# Patient Record
Sex: Male | Born: 1960 | Race: White | Hispanic: No | Marital: Married | Smoking: Never smoker
Health system: Southern US, Community
[De-identification: ages and names within clinical notes are randomized; demographics above are authoritative.]

## PROBLEM LIST (undated history)

## (undated) DIAGNOSIS — K219 Gastro-esophageal reflux disease without esophagitis: Secondary | ICD-10-CM

## (undated) DIAGNOSIS — Z0000 Encounter for general adult medical examination without abnormal findings: Principal | ICD-10-CM

## (undated) DIAGNOSIS — L918 Other hypertrophic disorders of the skin: Secondary | ICD-10-CM

## (undated) DIAGNOSIS — M1990 Unspecified osteoarthritis, unspecified site: Secondary | ICD-10-CM

## (undated) DIAGNOSIS — R7989 Other specified abnormal findings of blood chemistry: Secondary | ICD-10-CM

## (undated) DIAGNOSIS — R000 Tachycardia, unspecified: Secondary | ICD-10-CM

## (undated) DIAGNOSIS — E669 Obesity, unspecified: Secondary | ICD-10-CM

## (undated) DIAGNOSIS — G4730 Sleep apnea, unspecified: Secondary | ICD-10-CM

## (undated) DIAGNOSIS — Z8619 Personal history of other infectious and parasitic diseases: Secondary | ICD-10-CM

## (undated) DIAGNOSIS — I10 Essential (primary) hypertension: Secondary | ICD-10-CM

## (undated) DIAGNOSIS — B019 Varicella without complication: Secondary | ICD-10-CM

## (undated) DIAGNOSIS — D649 Anemia, unspecified: Secondary | ICD-10-CM

## (undated) DIAGNOSIS — E1169 Type 2 diabetes mellitus with other specified complication: Secondary | ICD-10-CM

## (undated) DIAGNOSIS — I739 Peripheral vascular disease, unspecified: Secondary | ICD-10-CM

## (undated) HISTORY — DX: Type 2 diabetes mellitus with other specified complication: E11.69

## (undated) HISTORY — DX: Personal history of other infectious and parasitic diseases: Z86.19

## (undated) HISTORY — DX: Varicella without complication: B01.9

## (undated) HISTORY — DX: Essential (primary) hypertension: I10

## (undated) HISTORY — DX: Peripheral vascular disease, unspecified: I73.9

## (undated) HISTORY — DX: Anemia, unspecified: D64.9

## (undated) HISTORY — DX: Other specified abnormal findings of blood chemistry: R79.89

## (undated) HISTORY — DX: Encounter for general adult medical examination without abnormal findings: Z00.00

## (undated) HISTORY — DX: Other hypertrophic disorders of the skin: L91.8

## (undated) HISTORY — DX: Obesity, unspecified: E66.9

## (undated) HISTORY — DX: Tachycardia, unspecified: R00.0

## (undated) HISTORY — PX: OTHER SURGICAL HISTORY: SHX169

---

## 2004-11-28 ENCOUNTER — Ambulatory Visit (HOSPITAL_BASED_OUTPATIENT_CLINIC_OR_DEPARTMENT_OTHER): Admission: RE | Admit: 2004-11-28 | Discharge: 2004-11-28 | Payer: Self-pay | Admitting: Family Medicine

## 2004-12-05 ENCOUNTER — Ambulatory Visit: Payer: Self-pay | Admitting: Internal Medicine

## 2010-03-21 LAB — HM COLONOSCOPY: HM Colonoscopy: NORMAL

## 2011-10-16 ENCOUNTER — Ambulatory Visit (INDEPENDENT_AMBULATORY_CARE_PROVIDER_SITE_OTHER): Payer: BC Managed Care – PPO | Admitting: Emergency Medicine

## 2011-10-16 VITALS — BP 138/88 | HR 76 | Temp 98.1°F | Resp 16 | Ht 68.0 in | Wt >= 6400 oz

## 2011-10-16 DIAGNOSIS — M79609 Pain in unspecified limb: Secondary | ICD-10-CM

## 2011-10-16 DIAGNOSIS — M79662 Pain in left lower leg: Secondary | ICD-10-CM

## 2011-10-16 DIAGNOSIS — R609 Edema, unspecified: Secondary | ICD-10-CM

## 2011-10-16 DIAGNOSIS — L309 Dermatitis, unspecified: Secondary | ICD-10-CM

## 2011-10-16 DIAGNOSIS — L259 Unspecified contact dermatitis, unspecified cause: Secondary | ICD-10-CM

## 2011-10-16 LAB — POCT CBC
Granulocyte percent: 67 %G (ref 37–80)
MCV: 94.2 fL (ref 80–97)
MID (cbc): 0.6 (ref 0–0.9)
POC Granulocyte: 4.7 (ref 2–6.9)
POC LYMPH PERCENT: 24.1 %L (ref 10–50)
POC MID %: 8.9 %M (ref 0–12)
Platelet Count, POC: 367 10*3/uL (ref 142–424)
RDW, POC: 15.1 %

## 2011-10-16 LAB — BASIC METABOLIC PANEL
BUN: 15 mg/dL (ref 6–23)
Potassium: 4.4 mEq/L (ref 3.5–5.3)
Sodium: 140 mEq/L (ref 135–145)

## 2011-10-16 MED ORDER — TRIAMCINOLONE 0.1 % CREAM:EUCERIN CREAM 1:1: TOPICAL_CREAM | CUTANEOUS | Status: DC

## 2011-10-16 NOTE — Progress Notes (Signed)
  Subjective:    Patient ID: Jeffrey Molina, male    DOB: [birth year], 51 y.o.   MRN: [id number]  HPI patient has a long history of venous stasis disease. Has a long history of calf swelling redness and scaling of both of his legs. He's never had a PE to hemorrhoids and is in good health. 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 There are venous stasis changes involving both legs. There is a mild amount of redness over the left anterior shin.  Results for orders placed in visit on 10/16/11  POCT CBC      Component Value Range   WBC 7.0  4.6 - 10.2 K/uL   Lymph, poc 1.7  0.6 - 3.4   POC LYMPH PERCENT 24.1  10 - 50 %L   MID (cbc) 0.6  0 - 0.9   POC MID % 8.9  0 - 12 %M   POC Granulocyte 4.7  2 - 6.9   Granulocyte percent 67.0  37 - 80 %G   RBC 5.25  4.69 - 6.13 M/uL   Hemoglobin 14.9  14.1 - 18.1 g/dL   HCT, POC 40.9  81.1 - 53.7 %   MCV 94.2  80 - 97 fL   MCH, POC 28.4  27 - 31.2 pg   MCHC 30.1 (*) 31.8 - 35.4 g/dL   RDW, POC 91.4     Platelet Count, POC 367  142 - 424 K/uL   MPV 7.3  0 - 99.8 fL        Assessment & Plan:  Patient with severe venous stasis disease with questionable cellulitis but I doubt this . 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 We'll treat with triamcinolone Eucerin mixed. Have given him the number  for getting support stockings. We'll set him up for venous Dopplers to see how significant his venous insufficiency is .

## 2011-10-16 NOTE — Patient Instructions (Addendum)
Venous Stasis and Chronic Venous Insufficiency As people age, the veins located in their legs may weaken and stretch. When veins weaken and lose the ability to pump blood effectively, the condition is called chronic venous insufficiency (CVI) or venous stasis. Almost all veins return blood back to the heart. This happens by:  The force of the heart pumping fresh blood pushes blood back to the heart.   Blood flowing to the heart from the force of gravity.  In the deep veins of the legs, blood has to fight gravity and flow upstream back to the heart. Here, the leg muscles contract to pump blood back toward the heart. Vein walls are elastic, and many veins have small valves that only allow blood to flow in one direction. When leg muscles contract, they push inward against the elastic vein walls. This squeezes blood upward, opens the valves, and moves blood toward the heart. When leg muscles relax, the vein wall also relaxes and the valves inside the vein close to prevent blood from flowing backward. This method of pumping blood out of the legs is called the venous pump. CAUSES  The venous pump works best while walking and leg muscles are contracting. But when a person sits or stands, blood pressure in leg veins can build. Deep veins are usually able to withstand short periods of inactivity, but long periods of inactivity (and increased pressure) can stretch, weaken, and damage vein walls. High blood pressure can also stretch and damage vein walls. The veins may no longer be able to pump blood back to the heart. Venous hypertension (high blood pressure inside veins) that lasts over time is a primary cause of CVI. CVI can also be caused by:   Deep vein thrombosis, a condition where a thrombus (blood clot) blocks blood flow in a vein.   Phlebitis, an inflammation of a superficial vein that causes a blood clot to form.  Other risk factors for CVI may include:   Heredity.   Obesity.   Pregnancy.    Sedentary lifestyle.   Smoking.   Jobs requiring long periods of standing or sitting in one place.   Age and gender:   Women in their 40's and 50's and men in their 70's are more prone to developing CVI.  SYMPTOMS  Symptoms of CVI may include:   Varicose veins.   Ulceration or skin breakdown.   Lipodermatosclerosis, a condition that affects the skin just above the ankle, usually on the inside surface. Over time the skin becomes brown, smooth, tight and often painful. Those with this condition have a high risk of developing skin ulcers.   Reddened or discolored skin on the leg.   Swelling.  DIAGNOSIS  Your caregiver can diagnose CVI after performing a careful medical history and physical examination. To confirm the diagnosis, the following tests may also be ordered:   Duplex ultrasound.   Plethysmography (tests blood flow).   Venograms (x-ray using a special dye).  TREATMENT The goals of treatment for CVI are to restore a person to an active life and to minimize pain or disability. Typically, CVI does not pose a serious threat to life or limb, and with proper treatment most people with this condition can continue to lead active lives. In most cases, mild CVI can be treated on an outpatient basis with simple procedures. Treatment methods include:   Elastic compression socks.   Sclerotherapy, a procedure involving an injection of a material that "dissolves" the damaged veins. Other veins in the network   of blood vessels take over the function of the damaged veins.   Vein stripping (an older procedure less commonly used).   Laser Ablation surgery.   Valve repair.  HOME CARE INSTRUCTIONS   Elastic compression socks must be worn every day. They can help with symptoms and lower the chances of the problem getting worse, but they do not cure the problem.   Only take over-the-counter or prescription medicines for pain, discomfort, or fever as directed by your caregiver.    Your caregiver will review your other medications with you.  SEEK MEDICAL CARE IF:   You are confused about how to take your medications.   There is redness, swelling, or increasing pain in the affected area.   There is a red streak or line that extends up or down from the affected area.   There is a breakdown or loss of skin in the affected area, even if the breakdown is small.   You develop an unexplained oral temperature above 102 F (38.9 C).   There is an injury to the affected area.  SEEK IMMEDIATE MEDICAL CARE IF:   There is an injury and open wound to the affected area.   Pain is not adequately relieved with pain medication prescribed or becomes severe.   An oral temperature above 102 F (38.9 C) develops.   The foot/ankle below the affected area becomes suddenly numb or the area feels weak and hard to move.  MAKE SURE YOU:   Understand these instructions.   Will watch your condition.   Will get help right away if you are not doing well or get worse.  Document Released: 07/11/2006 Document Revised: 02/24/2011 Document Reviewed: 09/18/2006 [facility] Patient Information 2012 [place], [place].Varicose Veins Varicose veins are veins that have become enlarged and twisted. CAUSES This condition is the result of valves in the veins not working properly. Valves in the veins help return blood from the leg to the heart. If these valves are damaged, blood flows backwards and backs up into the veins in the leg near the skin. This causes the veins to become larger. People who are on their feet a lot, who are pregnant, or who are overweight are more likely to develop varicose veins. SYMPTOMS   Bulging, twisted-appearing, bluish veins, most commonly found on the legs.   Leg pain or a feeling of heaviness. These symptoms may be worse at the end of the day.   Leg swelling.   Skin color changes.  DIAGNOSIS  Varicose veins can usually be diagnosed with an exam of your legs by  your caregiver. He or she may recommend an ultrasound of your leg veins. TREATMENT  Most varicose veins can be treated at home.However, other treatments are available for people who have persistent symptoms or who want to treat the cosmetic appearance of the varicose veins. These include:  Laser treatment of very small varicose veins.   Medicine that is shot (injected) into the vein. This medicine hardens the walls of the vein and closes off the vein. This treatment is called sclerotherapy. Afterwards, you may need to wear clothing or bandages that apply pressure.   Surgery.  HOME CARE INSTRUCTIONS   Do not stand or sit in one position for long periods of time. Do not sit with your legs crossed. Rest with your legs raised during the day.   Wear elastic stockings or support hose. Do not wear other tight, encircling garments around the legs, pelvis, or waist.   Walk as much  as possible to increase blood flow.   Raise the foot of your bed at night with 2-inch blocks.   If you get a cut in the skin over the vein and the vein bleeds, lie down with your leg raised and press on it with a clean cloth until the bleeding stops. Then place a bandage (dressing) on the cut. See your caregiver if it continues to bleed or needs stitches.  SEEK MEDICAL CARE IF:   The skin around your ankle starts to break down.   You have pain, redness, tenderness, or hard swelling developing in your leg over a vein.   You are uncomfortable due to leg pain.  Document Released: 12/15/2004 Document Revised: 02/24/2011 Document Reviewed: 05/03/2010 [facility] Patient Information 2012 [place], [place].

## 2011-10-19 ENCOUNTER — Telehealth: Payer: Self-pay | Admitting: *Deleted

## 2011-10-19 NOTE — Telephone Encounter (Signed)
I had called to make and appt for another pt at the vascular lab and the scheduler told that Mr. Jeffrey Molina had called and changed his appt to 08/13 and stated that he was ok to wait.  Just FYI

## 2011-10-20 ENCOUNTER — Ambulatory Visit (HOSPITAL_COMMUNITY): Payer: BC Managed Care – PPO

## 2011-11-02 ENCOUNTER — Ambulatory Visit (HOSPITAL_COMMUNITY)
Admission: RE | Admit: 2011-11-02 | Discharge: 2011-11-02 | Disposition: A | Discharge status: Home / Self Care | Payer: BC Managed Care – PPO | Source: Ambulatory Visit | Attending: Emergency Medicine | Admitting: Emergency Medicine

## 2011-11-02 ENCOUNTER — Telehealth: Payer: Self-pay | Admitting: Radiology

## 2011-11-02 DIAGNOSIS — M79662 Pain in left lower leg: Secondary | ICD-10-CM

## 2011-11-02 DIAGNOSIS — M7989 Other specified soft tissue disorders: Secondary | ICD-10-CM

## 2011-11-02 DIAGNOSIS — R609 Edema, unspecified: Secondary | ICD-10-CM | POA: Insufficient documentation

## 2011-11-02 NOTE — Progress Notes (Signed)
VASCULAR LAB PRELIMINARY  PRELIMINARY  PRELIMINARY  PRELIMINARY  Bilateral lower extremity venous duplex completed.    Preliminary report:  Bilateral:  No evidence of DVT, superficial thrombosis, or Baker's Cyst. Mild edema right lower leg. Mild to moderate edema left lower extremity. Anechoic area noted around the anterior to lateral knee consistent with fluid.   Adriena Manfre, RVS 11/02/2011, 10:22 AM

## 2011-11-02 NOTE — Telephone Encounter (Signed)
Left message with a male to have Pt call us back when he returns home later today. Eileen Stanford

## 2011-11-02 NOTE — Telephone Encounter (Signed)
Please call patient know it is important that he wear his support stockings . If you would like referral to one of the vein specialist I would be happy to do that

## 2011-11-02 NOTE — Telephone Encounter (Signed)
Patient has had doppler study, it is negative for blood clots, he does have localized fluid collection, as noted in report. I have advised [place] in the Vascular Lab to let patient go home, to tell him the study is negative for blood clots and we will call him with further instructions.

## 2011-11-03 NOTE — Telephone Encounter (Signed)
Called home # and it sounded like an older child answered. I asked if pt had a cell # I could call him on and was given [phone]. LMOM on pt's VM to CB.

## 2011-11-07 NOTE — Telephone Encounter (Signed)
LMOM on pt's cell phone to CB.

## 2011-11-08 NOTE — Telephone Encounter (Signed)
LMOM on cell that I will send him a note w/message from Dr Cleta Alberts since we have been unable to reach him by phone, but that he can CB if he has any ?s/concerns or if he would like the referral to vein specialist. Mailed unable to reach letter w/instr's from Dr Cleta Alberts.

## 2011-11-14 NOTE — Telephone Encounter (Signed)
Pt had CB and LM on nurse VM that he was returning our call. Tried to call him back and had to LM again. On message pt left it sounded like he said there was some problem w/him losing something to do w/his stockings. Clarify if pt needs something.

## 2011-11-14 NOTE — Telephone Encounter (Signed)
I have called and left message for him to advise.

## 2011-11-14 NOTE — Telephone Encounter (Signed)
He needs to know the name and location of the hosiery outlet. I am having this information sent to me and I will call on his cell

## 2012-05-02 ENCOUNTER — Ambulatory Visit (INDEPENDENT_AMBULATORY_CARE_PROVIDER_SITE_OTHER): Payer: BC Managed Care – PPO | Admitting: Physician Assistant

## 2012-05-02 VITALS — BP 150/98 | HR 89 | Temp 98.1°F | Resp 16 | Ht 68.0 in | Wt >= 6400 oz

## 2012-05-02 DIAGNOSIS — H9201 Otalgia, right ear: Secondary | ICD-10-CM

## 2012-05-02 DIAGNOSIS — H9209 Otalgia, unspecified ear: Secondary | ICD-10-CM

## 2012-05-02 MED ORDER — IPRATROPIUM BROMIDE 0.03 % NA SOLN: 2.0000 | Freq: Two times a day (BID) | NASAL | Status: DC

## 2012-05-02 MED ORDER — AMOXICILLIN 500 MG PO TABS: 1000.0000 mg | ORAL_TABLET | Freq: Three times a day (TID) | ORAL | Status: DC

## 2012-05-02 NOTE — Progress Notes (Signed)
  Subjective:    Patient ID: Jeffrey Molina, male    DOB: May 30, 1960, 52 y.o.   MRN: [id number]  HPI   Jeffrey Molina is a pleasant 52 yr old male here with concern for ear infection.  Began having right ear pain around 2pm yesterday.  Almost feels like there is water in the ear.  Woke up from the pain several times last night.  Denies change in hearing.  Also having some discomfort with swallowing.  Denies sore throat, endorses only right sided discomfort with swallowing.  States that he has "sinus problems" but denies any symptoms today.  No cough.  No fever or chills.  States a coworker was recently diagnosed with an ear infection, wonders if this could be related to his symptoms.    Pt has elevated BP today at 150/98.  States his BP varies widely but he is usually "high normal"  Review of Systems  Constitutional: Negative for fever and chills.  HENT: Positive for ear pain (right). Negative for hearing loss, congestion, sore throat, rhinorrhea and ear discharge.   Respiratory: Negative.   Cardiovascular: Negative.   Gastrointestinal: Negative.   Musculoskeletal: Negative.   Skin: Negative.   Neurological: Negative for dizziness, light-headedness and headaches.       Objective:   Physical Exam  Vitals reviewed. Constitutional: He is oriented to person, place, and time. He appears well-developed and well-nourished. No distress.  HENT:  Head: Normocephalic and atraumatic.  Right Ear: Ear canal normal.  Left Ear: Tympanic membrane and ear canal normal.  Nose: Nose normal. Right sinus exhibits no maxillary sinus tenderness and no frontal sinus tenderness. Left sinus exhibits no maxillary sinus tenderness and no frontal sinus tenderness.  Mouth/Throat: Uvula is midline, oropharynx is clear and moist and mucous membranes are normal.  Right TM mildly erythematous and flat but not bulging  Eyes: Conjunctivae are normal. No scleral icterus.  Neck: Neck supple.  Cardiovascular: Normal rate,  regular rhythm and normal heart sounds.   Pulmonary/Chest: Breath sounds normal.  Lymphadenopathy:    He has no cervical adenopathy.  Neurological: He is alert and oriented to person, place, and time.  Skin: Skin is warm and dry.  Psychiatric: He has a normal mood and affect. His behavior is normal.     Filed Vitals:   05/02/12 0824  BP: 150/98  Pulse: 89  Temp: 98.1 F (36.7 C)  Resp: 16        Assessment & Plan:   1. Otalgia of right ear  amoxicillin (AMOXIL) 500 MG tablet      ipratropium (ATROVENT) 0.03 % nasal spray    Jeffrey Molina is a pleasant 52 yr old male with otalgia.  Right TM is mildly erythematous compared with the left but it is not bulging.  Possibly mid ear effusion vs eustachian tube dysfunction vs early OM.  Discussed with pt that I think abx may be overkill at this point.  I think it's reasonable to try Atrovent nasal spray and ibuprofen for pain relief.  Pt concerned about winter weather, would like to start abx now.  I have sent amox to his pharmacy.  Encouraged him to finish the full course of antibiotics if he starts them.  Discussed RTC precautions.  Pt understands and is in agreement.

## 2012-05-02 NOTE — Patient Instructions (Addendum)
Begin taking the antibiotics as directed (2 pills, three times per day) for 10 days.  Take with food to reduce stomach upset.  Be sure to finish the full course.  In addition, begin using Atrovent nasal spray twice daily to help relieve pressure in the ear.   If you feel like you are worsening or not improving, please let us know.

## 2013-04-21 LAB — HM DIABETES EYE EXAM

## 2013-05-26 ENCOUNTER — Ambulatory Visit (INDEPENDENT_AMBULATORY_CARE_PROVIDER_SITE_OTHER): Payer: BC Managed Care – PPO | Admitting: Internal Medicine

## 2013-05-26 DIAGNOSIS — R609 Edema, unspecified: Secondary | ICD-10-CM

## 2013-05-26 DIAGNOSIS — I739 Peripheral vascular disease, unspecified: Secondary | ICD-10-CM

## 2013-05-26 LAB — COMPREHENSIVE METABOLIC PANEL
ALBUMIN: 4.1 g/dL (ref 3.5–5.2)
ALK PHOS: 39 U/L (ref 39–117)
ALT: 30 U/L (ref 0–53)
AST: 22 U/L (ref 0–37)
BUN: 14 mg/dL (ref 6–23)
CALCIUM: 9.2 mg/dL (ref 8.4–10.5)
CHLORIDE: 102 meq/L (ref 96–112)
CO2: 29 mEq/L (ref 19–32)
Creat: 1.06 mg/dL (ref 0.50–1.35)
Glucose, Bld: 117 mg/dL — ABNORMAL HIGH (ref 70–99)
POTASSIUM: 4.5 meq/L (ref 3.5–5.3)
SODIUM: 140 meq/L (ref 135–145)
TOTAL PROTEIN: 6.9 g/dL (ref 6.0–8.3)
Total Bilirubin: 0.4 mg/dL (ref 0.2–1.2)

## 2013-05-26 LAB — LIPID PANEL
Cholesterol: 187 mg/dL (ref 0–200)
HDL: 30 mg/dL — ABNORMAL LOW (ref >39)
LDL CALC: 132 mg/dL — AB (ref 0–99)
Total CHOL/HDL Ratio: 6.2 Ratio
Triglycerides: 126 mg/dL (ref <150)
VLDL: 25 mg/dL (ref 0–40)

## 2013-05-26 LAB — POCT CBC
GRANULOCYTE PERCENT: 63.1 % (ref 37–80)
HCT, POC: 43.5 % (ref 43.5–53.7)
Hemoglobin: 13.8 g/dL — AB (ref 14.1–18.1)
Lymph, poc: 1.7 (ref 0.6–3.4)
MCH, POC: 29.7 pg (ref 27–31.2)
MCHC: 31.7 g/dL — AB (ref 31.8–35.4)
MCV: 93.5 fL (ref 80–97)
MID (CBC): 0.6 (ref 0–0.9)
MPV: 7.4 fL (ref 0–99.8)
PLATELET COUNT, POC: 337 10*3/uL (ref 142–424)
POC GRANULOCYTE: 4 (ref 2–6.9)
POC LYMPH %: 27.1 % (ref 10–50)
POC MID %: 9.8 %M (ref 0–12)
RBC: 4.65 M/uL — AB (ref 4.69–6.13)
RDW, POC: 15.4 %
WBC: 6.3 10*3/uL (ref 4.6–10.2)

## 2013-05-26 LAB — POCT GLYCOSYLATED HEMOGLOBIN (HGB A1C): Hemoglobin A1C: 5.9

## 2013-05-26 LAB — TSH: TSH: 4.145 u[IU]/mL (ref 0.350–4.500)

## 2013-05-26 MED ORDER — HYDROCHLOROTHIAZIDE 12.5 MG PO CAPS: 12.5000 mg | ORAL_CAPSULE | Freq: Every day | ORAL | Status: DC

## 2013-05-26 NOTE — Progress Notes (Signed)
Subjective:    Patient ID: Jeffrey Molina, male    DOB: 1960/11/24, 53 y.o.   MRN: [id number] This chart was scribed for Jeffrey Sia, MD by Nicholos Johns, Medical Scribe. This patient's care was started at 10:20 AM.  Leg Pain    HPI Comments: Jeffrey Molina is a 53 y.o. male who presents to the [facility] complaining of gradually worsening bilateral leg pain w/ associated leg swelling; initial onset a few months ago. States legs are more swollen today than normal. Reports difficulty walking and pain after standing for long periods of time. Occasional SOB associated with the leg pain due to energy exerted to walk. States he has very little cartilage left in the left knee and leans more on the right leg to walk. Reports constantly having to "wake legs up." Denies hx of ulcers in the feet but states the balls of his feet hurt after walking for a long period of time. Never been a smoker. Denies hx of diabetes, blood clot, or heart disease/htn.  PCP: Dr. Clarene Duke There are no active problems to display for this patient.  he denies any medications   Review of Systems  Constitutional: Negative for fever, appetite change, fatigue and unexpected weight change.  Respiratory: Positive for shortness of breath. Negative for wheezing.        Sedentary/lacks conditioning  Cardiovascular: Positive for leg swelling. Negative for chest pain and palpitations.  Gastrointestinal: Negative for abdominal pain.  Genitourinary: Negative for frequency and difficulty urinating.  Skin: Negative for rash.  Hematological: Does not bruise/bleed easily.   Objective:   Physical Exam  Vitals reviewed. Constitutional: He is oriented to person, place, and time. He appears well-developed and well-nourished. No distress.  Morbidly obese.  HENT:  Head: Normocephalic and atraumatic.  Eyes: Conjunctivae and EOM are normal. Pupils are equal, round, and reactive to light.  Neck: Neck supple. No  thyromegaly present.  Cardiovascular: Normal rate, regular rhythm, normal heart sounds and intact distal pulses.  Exam reveals no gallop.   No murmur heard. Pulmonary/Chest: Effort normal and breath sounds normal. No respiratory distress. He has no wheezes.  Abdominal: Soft. He exhibits no mass. There is no tenderness.  No abdominal or femoral bruit. Diminished femoral pulses.  Musculoskeletal: Normal range of motion.  3+ pitting edema over lower extremities with hyperpigmentation of the skin. Dorsalis pedis pulses are intact. No sensory losses in lower extremities.   Lymphadenopathy:    He has no cervical adenopathy.  Neurological: He is alert and oriented to person, place, and time.  Skin: Skin is warm and dry.  Psychiatric: He has a normal mood and affect. His behavior is normal.   Assessment & Plan:  I have completed the patient encounter in its entirety as documented by the scribe, with editing by me where necessary. Robert P. Merla Riches, M.D.  Morbid obesity - Plan: POCT CBC, Comprehensive metabolic panel, Lipid panel, POCT glycosylated hemoglobin (Hb A1C), TSH  Edema - Plan: POCT CBC, Comprehensive metabolic panel, Lipid panel, POCT glycosylated hemoglobin (Hb A1C), hydrochlorothiazide (MICROZIDE) 12.5 MG capsule(as mildly elevated blood pressure without diagnosis of hypertension)  Claudication- Plan-arterial Dopplers    Addendum-labs Results for orders placed in visit on 05/26/13  COMPREHENSIVE METABOLIC PANEL      Result Value Ref Range   Sodium 140  135 - 145 mEq/L   Potassium 4.5  3.5 - 5.3 mEq/L   Chloride 102  96 - 112 mEq/L   CO2 29  19 - 32  mEq/L   Glucose, Bld 117 (*) 70 - 99 mg/dL   BUN 14  6 - 23 mg/dL   Creat 9.141.06  7.820.50 - 9.561.35 mg/dL   Total Bilirubin 0.4  0.2 - 1.2 mg/dL   Alkaline Phosphatase 39  39 - 117 U/L   AST 22  0 - 37 U/L   ALT 30  0 - 53 U/L   Total Protein 6.9  6.0 - 8.3 g/dL   Albumin 4.1  3.5 - 5.2 g/dL   Calcium 9.2  8.4 - 21.310.5 mg/dL  LIPID  PANEL      Result Value Ref Range   Cholesterol 187  0 - 200 mg/dL   Triglycerides 086126  <578<150 mg/dL   HDL 30 (*) >46>39 mg/dL   Total CHOL/HDL Ratio 6.2     VLDL 25  0 - 40 mg/dL   LDL Cholesterol 962132 (*) 0 - 99 mg/dL  TSH      Result Value Ref Range   TSH 4.145  0.350 - 4.500 uIU/mL  POCT CBC      Result Value Ref Range   WBC 6.3  4.6 - 10.2 K/uL   Lymph, poc 1.7  0.6 - 3.4   POC LYMPH PERCENT 27.1  10 - 50 %L   MID (cbc) 0.6  0 - 0.9   POC MID % 9.8  0 - 12 %M   POC Granulocyte 4.0  2 - 6.9   Granulocyte percent 63.1  37 - 80 %G   RBC 4.65 (*) 4.69 - 6.13 M/uL   Hemoglobin 13.8 (*) 14.1 - 18.1 g/dL   HCT, POC 95.243.5  84.143.5 - 53.7 %   MCV 93.5  80 - 97 fL   MCH, POC 29.7  27 - 31.2 pg   MCHC 31.7 (*) 31.8 - 35.4 g/dL   RDW, POC 32.415.4     Platelet Count, POC 337  142 - 424 K/uL   MPV 7.4  0 - 99.8 fL  POCT GLYCOSYLATED HEMOGLOBIN (HGB A1C)      Result Value Ref Range   Hemoglobin A1C 5.9

## 2013-05-28 ENCOUNTER — Other Ambulatory Visit: Payer: Self-pay | Admitting: Radiology

## 2013-05-28 DIAGNOSIS — R609 Edema, unspecified: Secondary | ICD-10-CM

## 2013-05-28 DIAGNOSIS — I739 Peripheral vascular disease, unspecified: Secondary | ICD-10-CM

## 2013-05-29 ENCOUNTER — Encounter: Payer: Self-pay | Admitting: Internal Medicine

## 2013-05-31 ENCOUNTER — Ambulatory Visit (HOSPITAL_COMMUNITY): Payer: BC Managed Care – PPO | Attending: Internal Medicine | Admitting: *Deleted

## 2013-05-31 DIAGNOSIS — R609 Edema, unspecified: Secondary | ICD-10-CM | POA: Insufficient documentation

## 2013-05-31 DIAGNOSIS — I739 Peripheral vascular disease, unspecified: Secondary | ICD-10-CM | POA: Insufficient documentation

## 2013-05-31 NOTE — Progress Notes (Signed)
Lower extremity arterial doppler complete. 

## 2013-06-02 ENCOUNTER — Ambulatory Visit (INDEPENDENT_AMBULATORY_CARE_PROVIDER_SITE_OTHER): Payer: BC Managed Care – PPO | Admitting: Physician Assistant

## 2013-06-02 ENCOUNTER — Ambulatory Visit: Payer: BC Managed Care – PPO

## 2013-06-02 VITALS — BP 132/84 | HR 113 | Temp 97.9°F | Resp 18 | Ht 73.0 in | Wt >= 6400 oz

## 2013-06-02 DIAGNOSIS — R7309 Other abnormal glucose: Secondary | ICD-10-CM

## 2013-06-02 DIAGNOSIS — M179 Osteoarthritis of knee, unspecified: Secondary | ICD-10-CM | POA: Insufficient documentation

## 2013-06-02 DIAGNOSIS — I872 Venous insufficiency (chronic) (peripheral): Secondary | ICD-10-CM | POA: Insufficient documentation

## 2013-06-02 DIAGNOSIS — M1710 Unilateral primary osteoarthritis, unspecified knee: Secondary | ICD-10-CM

## 2013-06-02 DIAGNOSIS — M25569 Pain in unspecified knee: Secondary | ICD-10-CM

## 2013-06-02 DIAGNOSIS — I10 Essential (primary) hypertension: Secondary | ICD-10-CM | POA: Insufficient documentation

## 2013-06-02 DIAGNOSIS — IMO0002 Reserved for concepts with insufficient information to code with codable children: Secondary | ICD-10-CM

## 2013-06-02 DIAGNOSIS — I8310 Varicose veins of unspecified lower extremity with inflammation: Secondary | ICD-10-CM

## 2013-06-02 DIAGNOSIS — R739 Hyperglycemia, unspecified: Secondary | ICD-10-CM

## 2013-06-02 LAB — GLUCOSE, POCT (MANUAL RESULT ENTRY): POC GLUCOSE: 117 mg/dL — AB (ref 70–99)

## 2013-06-02 MED ORDER — MELOXICAM 15 MG PO TABS: 15.0000 mg | ORAL_TABLET | Freq: Every day | ORAL | Status: DC

## 2013-06-02 NOTE — Patient Instructions (Addendum)
Please contact your primary care provider to schedule a visit. You should fast, so that your glucose can be rechecked at that visit. 2 fasting glucose readings above 100 is the diagnosis of diabetes. Your weight is most likely causing the problem in your legs, compressing the nerves to your legs when you sit causing them to fall asleep. Discuss healthier eating and an exercise regimen with your primary care provider.  Dr. Merla Richesoolittle will review the doppler report and contact you with the final results. He can arrange for your primary care provider to receive a copy, as well.

## 2013-06-02 NOTE — Progress Notes (Signed)
LS Spine: [facility] reading (PRIMARY) by  Dr. Katrinka BlazingSmith.  Diffuse degenerative changes with anterior spurring and loss of disc space; limited due to body habitus.  RIGHT Tib/Fib: [facility] reading (PRIMARY) by  Dr. Katrinka BlazingSmith.  No fracture.  I have examined this patient along with the student and agree.

## 2013-06-02 NOTE — Progress Notes (Signed)
Subjective:    Patient ID: Jeffrey Molina, male    DOB: 03-29-1960, 53 y.o.   MRN: [id number]  HPI Patient presents with one day history of pain from mid shin to the ankle with no trauma.  He reports he is having trouble walking because he feels like his leg is going to give away.  He has a history of left knee osteoarthritis which has been bothering him recently and he has been putting increased weight on the right leg.  He saw Dr. Merla Richesoolittle at this clinic on 05/26/13 for a 2-3 week history of progressively worsening bilateral lower leg symptoms stating when he sits at his desk at work and then gets up his legs feels like they are asleep, they wake up after about 10-15 seconds and after walking 10-15 feet.  Also, he has bilateral lower leg pain with walking longer distances, about 200 feet.  He was started on HCTZ 12.5 mg daily for BLE edema which has improved and was ordered doppler studies of BLE which was done 05/31/13 and states the tech said his circulation was "remarkably good".  He denies any hip pain but reports some mild lower back pain this morning only with no history of back problems.    [facility] to obtain results of doppler studies done 05/31/13 but the results were not available.  Review of Systems  Constitutional: Negative for fever and chills.      Objective:   Physical Exam  Constitutional: He is oriented to person, place, and time. He appears well-developed and well-nourished. No distress.  He is morbidly obese.  HENT:  Head: Normocephalic and atraumatic.  Eyes: Conjunctivae and EOM are normal. Pupils are equal, round, and reactive to light.  Cardiovascular: Normal rate, regular rhythm and normal heart sounds.  Exam reveals no gallop and no friction rub.   No murmur heard. Pulses:      Dorsalis pedis pulses are 1+ on the right side, and 1+ on the left side.  Pulmonary/Chest: Effort normal and breath sounds normal.    Musculoskeletal:  Normal ROM bilateral knees, ankles and hips Leg raise negative except for hamstring tightness   Neurological: He is alert and oriented to person, place, and time.  Sensation intact to BLE  Skin: Skin is warm and dry.  Venous stasis dermatitis to BLE. BLE warm and dry except cool over venous stasis dermatitis areas. No open areas. No signs of DVT noted.  Psychiatric: He has a normal mood and affect. His behavior is normal. Judgment and thought content normal.       Assessment & Plan:   1. Pain in joint, lower leg Likely due to obesity and favoring of the left knee. - DG Lumbar Spine Complete; Future - DG Tibia/Fibula Right; Future  2. DJD (degenerative joint disease) of knee Left knee with longstanding history of osteoarthritis with recent flare causing increased weight bearing to the right leg.  Will treat symptomatically. - meloxicam (MOBIC) 15 MG tablet; Take 1 tablet (15 mg total) by mouth daily.  Dispense: 30 tablet; Refill: 0  3. Stasis dermatitis of both legs Likely due to venous insufficiency and morbid obesity.  4. Hyperglycemia FSBS 117 today fasting.  Discussed with the patient to return to his PCP for another fasting level within one month.  Discussed the possibility of a diagnosis of diabetes and of need for weight loss and regular exercise. - POCT glucose (manual entry)  5. Morbid obesity Discussed  the need for weight loss.  The patient will discuss this with his PCP and has been considering bariatric surgery.

## 2013-06-13 ENCOUNTER — Ambulatory Visit (INDEPENDENT_AMBULATORY_CARE_PROVIDER_SITE_OTHER): Payer: BC Managed Care – PPO | Admitting: Family Medicine

## 2013-06-13 ENCOUNTER — Encounter: Payer: Self-pay | Admitting: Family Medicine

## 2013-06-13 ENCOUNTER — Other Ambulatory Visit (INDEPENDENT_AMBULATORY_CARE_PROVIDER_SITE_OTHER): Payer: BC Managed Care – PPO

## 2013-06-13 VITALS — BP 152/90 | HR 89 | Ht 73.0 in | Wt >= 6400 oz

## 2013-06-13 DIAGNOSIS — M79609 Pain in unspecified limb: Secondary | ICD-10-CM

## 2013-06-13 DIAGNOSIS — I7789 Other specified disorders of arteries and arterioles: Secondary | ICD-10-CM | POA: Insufficient documentation

## 2013-06-13 DIAGNOSIS — M79604 Pain in right leg: Secondary | ICD-10-CM

## 2013-06-13 NOTE — Assessment & Plan Note (Signed)
I believe secondary to patient's body habitus he is having popliteal artery entrapment syndrome. Complete when he sits too long he gets entrapment of the artery which unfortunately causes an ischemic temporary feeling. Patient has reperfusion the to do some pain in the musculature of the medial gastroc. Patient given a compression sleeve today. Patient given home exercise program and we discussed changing his sitting position. Patient is going to talk to another provider about his morbid obesity consider lap band surgery which I think would be likely curative of this problem in the long run. Patient will try these interventions and come back and see me in 3 weeks. If he continues to have trouble we may need to consider further imaging hoping patient does improve significantly.

## 2013-06-13 NOTE — Patient Instructions (Signed)
Good to meet you Try exercises 3 times a week When sitting take a towel or jacket and put under your thigh half way up.  Wear the compression with activity Look up Popliteal artery entrapment syndrome, this is just due to your size though and no surgery is needed for this.  Come back in 3 weeks to check in.  Jeffrey DerbyStacy Molina would be great.

## 2013-06-13 NOTE — Progress Notes (Signed)
Jeffrey ScaleZach Molina D.O. [facility] [address] [place], [place] [address] Phone: [phone] Subjective:     CC: Right leg numbness and pain  BJY:NWGNFAOZHYHPI:Subjective Jeffrey MurphyWayne Molina is a 53 y.o. male coming in with complaint of right leg numbness and pain. Patient states he has had this pain intermittently for quite some time but the duration and severity has started to increase. Patient states at some the right lower extremity distal to the knee and towards the ankle mostly on the medial aspect when it occurs. Patient states that he sits for a long amount of time when he tries to stand up he has significant pain and has trouble even with weightbearing. Patient is to remember any true injury but knows that he does have significant arthritis of the knees bilaterally. Patient was seen previously by an urgent care facility and had x-rays which were unremarkable and reviewed today. In addition this patient was sent for Doppler to rule out deep venous thrombosis which was also negative and normal ABIs. Patient states unfortunately continues and sometimes can occur when he wakes up at night. Patient denies any knee pain with this, states that the sensation of the leg only seems to come back.     Past medical history, social, surgical and family history all reviewed in electronic medical record.   Review of Systems: No headache, visual changes, nausea, vomiting, diarrhea, constipation, dizziness, abdominal pain, skin rash, fevers, chills, night sweats, weight loss, swollen lymph nodes, body aches, joint swelling, muscle aches, chest pain, shortness of breath, mood changes.   Objective Blood pressure 152/90, pulse 89, height 6\' 1"  (1.854 m), weight 425 lb (192.779 kg), SpO2 97.00%.  General: No apparent distress alert and oriented x3 mood and affect normal, dressed appropriately. Morbidly obese HEENT: Pupils equal, extraocular movements intact  Respiratory: Patient's speak in full sentences and does not  appear short of breath  Cardiovascular: 2+ pitting lower extremity edema that is symmetric to the contralateral side, non tender, no erythema  Skin: Warm dry intact with no signs of infection or rash on extremities or on axial skeleton.  Abdomen: Soft nontender  Neuro: Cranial nerves II through XII are intact, neurovascularly intact in all extremities with 2+ DTRs and 2+ pulses.  Lymph: No lymphadenopathy of posterior or anterior cervical chain or axillae bilaterally.  Gait normal with good balance and coordination.  MSK:  Non tender with full range of motion and good stability and symmetric strength and tone of shoulders, elbows, wrist, hip, knee and ankles bilaterally.  With compression of the popliteal artery for one minute patient does get a sensation that he normally feels. With release patient states he does have a little bit of pain but nothing like he usually has. Neurovascularly intact distally patient ambulates with an antalgic gait and over pronation of the hindfoot bilaterally.  MSK US performed of: Right This study was ordered, performed, and interpreted by Terrilee FilesZach Molina D.O.  Knee: All structures visualized. Anteromedial, anterolateral, posteromedial, and posterolateral menisci unremarkable without tearing, fraying, effusion, or displacement. Does have significant narrowing of the medial joint line Patellar Tendon unremarkable on long and transverse views without effusion. No abnormality of prepatellar bursa. LCL and MCL unremarkable on long and transverse views. No abnormality of origin of medial or lateral head of the gastrocnemius. Patient's popliteal artery is compressible  IMPRESSION:  Osteophytic changes of the knee    Impression and Recommendations:     This case required medical decision making of moderate complexity.

## 2013-07-02 ENCOUNTER — Encounter: Payer: Self-pay | Admitting: Family Medicine

## 2013-07-02 ENCOUNTER — Ambulatory Visit (INDEPENDENT_AMBULATORY_CARE_PROVIDER_SITE_OTHER): Payer: BC Managed Care – PPO | Admitting: Family Medicine

## 2013-07-02 VITALS — BP 154/92 | HR 106

## 2013-07-02 DIAGNOSIS — I7789 Other specified disorders of arteries and arterioles: Secondary | ICD-10-CM

## 2013-07-02 NOTE — Patient Instructions (Signed)
Good to see you Continue what you are doing.  Continue to work on your weight and look at [facility]  Step stool or some type of step for feet with sitting.  Continue brace See me when you need me.

## 2013-07-02 NOTE — Assessment & Plan Note (Signed)
Patient is making improvement and we did discuss other changes they can make at his desk that could hopefully help with symptoms. Patient does have good peripheral blood flow makes me think that this is all reversible and only secondary to his obesity. Encourage him to try some over-the-counter medications as well and continuing the brace. Patient will followup with me on an as-needed basis as long as he continues to improve.  Spent greater than 25 minutes with patient face-to-face and had greater than 50% of counseling including as described above in assessment and plan.

## 2013-07-02 NOTE — Progress Notes (Signed)
  Tawana ScaleZach Molina Jeffrey Molina D.O. [facility] [address] [place], [place] [address] Phone: [phone]) [phone] Subjective:     CC: Right leg numbness and pain follow up  BJY:NWGNFAOZHYHPI:Subjective Jeffrey MurphyWayne Molina is a 53 y.o. male coming in with followup of right leg numbness and pain. Patient was seen previously and secondary to patient's body habitus I think he is having more of a popliteal artery entrapment syndrome. Patient was changing his positions at work, continuing anti-inflammatories, given a different knee brace and states that he is approximately 65-70% better. Patient still states if he sits more than 3 hours he still has a partially the numbness occur but it seems to improve quickly. Denies any new symptoms. Patient is still awaiting and discussion with primary care provider about lap band surgery.  Please see previous note discussed workup that has already been obtained.     Past medical history, social, surgical and family history all reviewed in electronic medical record.   Review of Systems: No headache, visual changes, nausea, vomiting, diarrhea, constipation, dizziness, abdominal pain, skin rash, fevers, chills, night sweats, weight loss, swollen lymph nodes, body aches, joint swelling, muscle aches, chest pain, shortness of breath, mood changes.   Objective Blood pressure 154/92, pulse 106, SpO2 97.00%.  General: No apparent distress alert and oriented x3 mood and affect normal, dressed appropriately. Morbidly obese HEENT: Pupils equal, extraocular movements intact  Respiratory: Patient's speak in full sentences and does not appear short of breath  Cardiovascular: 2+ pitting lower extremity edema that is symmetric to the contralateral side, non tender, no erythema  Skin: Warm dry intact with no signs of infection or rash on extremities or on axial skeleton.  Abdomen: Soft nontender  Neuro: Cranial nerves II through XII are intact, neurovascularly intact in all extremities with 2+ DTRs and  2+ pulses.  Lymph: No lymphadenopathy of posterior or anterior cervical chain or axillae bilaterally.  Gait normal with good balance and coordination.  MSK:  Non tender with full range of motion and good stability and symmetric strength and tone of shoulders, elbows, wrist, hip, knee and ankles bilaterally. Patient is morbidly obese which makes it somewhat difficult for exam.      Impression and Recommendations:     This case required medical decision making of moderate complexity.

## 2013-10-28 ENCOUNTER — Ambulatory Visit (INDEPENDENT_AMBULATORY_CARE_PROVIDER_SITE_OTHER): Payer: BC Managed Care – PPO | Admitting: Family Medicine

## 2013-10-28 ENCOUNTER — Encounter: Payer: Self-pay | Admitting: Family Medicine

## 2013-10-28 ENCOUNTER — Telehealth: Payer: Self-pay | Admitting: Family Medicine

## 2013-10-28 DIAGNOSIS — L918 Other hypertrophic disorders of the skin: Secondary | ICD-10-CM | POA: Insufficient documentation

## 2013-10-28 DIAGNOSIS — I739 Peripheral vascular disease, unspecified: Secondary | ICD-10-CM

## 2013-10-28 DIAGNOSIS — M25562 Pain in left knee: Secondary | ICD-10-CM

## 2013-10-28 DIAGNOSIS — M25569 Pain in unspecified knee: Secondary | ICD-10-CM

## 2013-10-28 DIAGNOSIS — N529 Male erectile dysfunction, unspecified: Secondary | ICD-10-CM

## 2013-10-28 DIAGNOSIS — I10 Essential (primary) hypertension: Secondary | ICD-10-CM

## 2013-10-28 DIAGNOSIS — R7309 Other abnormal glucose: Secondary | ICD-10-CM

## 2013-10-28 DIAGNOSIS — L909 Atrophic disorder of skin, unspecified: Secondary | ICD-10-CM

## 2013-10-28 DIAGNOSIS — D649 Anemia, unspecified: Secondary | ICD-10-CM

## 2013-10-28 DIAGNOSIS — L919 Hypertrophic disorder of the skin, unspecified: Secondary | ICD-10-CM

## 2013-10-28 DIAGNOSIS — E291 Testicular hypofunction: Secondary | ICD-10-CM

## 2013-10-28 DIAGNOSIS — M25561 Pain in right knee: Secondary | ICD-10-CM

## 2013-10-28 DIAGNOSIS — G4733 Obstructive sleep apnea (adult) (pediatric): Secondary | ICD-10-CM | POA: Insufficient documentation

## 2013-10-28 DIAGNOSIS — Z8619 Personal history of other infectious and parasitic diseases: Secondary | ICD-10-CM | POA: Insufficient documentation

## 2013-10-28 DIAGNOSIS — G4730 Sleep apnea, unspecified: Secondary | ICD-10-CM

## 2013-10-28 DIAGNOSIS — Z9989 Dependence on other enabling machines and devices: Secondary | ICD-10-CM

## 2013-10-28 DIAGNOSIS — R739 Hyperglycemia, unspecified: Secondary | ICD-10-CM | POA: Insufficient documentation

## 2013-10-28 DIAGNOSIS — E785 Hyperlipidemia, unspecified: Secondary | ICD-10-CM | POA: Insufficient documentation

## 2013-10-28 DIAGNOSIS — R7989 Other specified abnormal findings of blood chemistry: Secondary | ICD-10-CM

## 2013-10-28 HISTORY — DX: Other hypertrophic disorders of the skin: L91.8

## 2013-10-28 HISTORY — DX: Anemia, unspecified: D64.9

## 2013-10-28 HISTORY — DX: Other specified abnormal findings of blood chemistry: R79.89

## 2013-10-28 MED ORDER — SILDENAFIL CITRATE 100 MG PO TABS: 50.0000 mg | ORAL_TABLET | Freq: Every day | ORAL | Status: DC | PRN

## 2013-10-28 NOTE — Telephone Encounter (Signed)
Received paperwork for Viagra, forward to nurse

## 2013-10-28 NOTE — Progress Notes (Signed)
Pre visit review using our clinic review tool, if applicable. No additional management support is needed unless otherwise documented below in the visit note. 

## 2013-10-28 NOTE — Patient Instructions (Addendum)
Minimize sodium elevate feet above heart for 15 minutes several times a day  Add PSA and Testosterone to next labs  Needs a quick weight check with MD in 4 weeks   DASH Eating Plan DASH stands for "Dietary Approaches to Stop Hypertension." The DASH eating plan is a healthy eating plan that has been shown to reduce high blood pressure (hypertension). Additional health benefits may include reducing the risk of type 2 diabetes mellitus, heart disease, and stroke. The DASH eating plan may also help with weight loss. WHAT DO I NEED TO KNOW ABOUT THE DASH EATING PLAN? For the DASH eating plan, you will follow these general guidelines:  Choose foods with a percent daily value for sodium of less than 5% (as listed on the food label).  Use salt-free seasonings or herbs instead of table salt or sea salt.  Check with your health care provider or pharmacist before using salt substitutes.  Eat lower-sodium products, often labeled as "lower sodium" or "no salt added."  Eat fresh foods.  Eat more vegetables, fruits, and low-fat dairy products.  Choose whole grains. Look for the word "whole" as the first word in the ingredient list.  Choose fish and skinless chicken or [place]key more often than red meat. Limit fish, poultry, and meat to 6 oz (170 g) each day.  Limit sweets, desserts, sugars, and sugary drinks.  Choose heart-healthy fats.  Limit cheese to 1 oz (28 g) per day.  Eat more home-cooked food and less restaurant, buffet, and fast food.  Limit fried foods.  Cook foods using methods other than frying.  Limit canned vegetables. If you do use them, rinse them well to decrease the sodium.  When eating at a restaurant, ask that your food be prepared with less salt, or no salt if possible. WHAT FOODS CAN I EAT? Seek help from a dietitian for individual calorie needs. Grains Whole grain or whole wheat bread. Brown rice. Whole grain or whole wheat pasta. Quinoa, bulgur, and whole grain  cereals. Low-sodium cereals. Corn or whole wheat flour tortillas. Whole grain cornbread. Whole grain crackers. Low-sodium crackers. Vegetables Fresh or frozen vegetables (raw, steamed, roasted, or grilled). Low-sodium or reduced-sodium tomato and vegetable juices. Low-sodium or reduced-sodium tomato sauce and paste. Low-sodium or reduced-sodium canned vegetables.  Fruits All fresh, canned (in natural juice), or frozen fruits. Meat and Other Protein Products Ground beef (85% or leaner), grass-fed beef, or beef trimmed of fat. Skinless chicken or [place]key. Ground chicken or [place]key. Pork trimmed of fat. All fish and seafood. Eggs. Dried beans, peas, or lentils. Unsalted nuts and seeds. Unsalted canned beans. Dairy Low-fat dairy products, such as skim or 1% milk, 2% or reduced-fat cheeses, low-fat ricotta or cottage cheese, or plain low-fat yogurt. Low-sodium or reduced-sodium cheeses. Fats and Oils Tub margarines without trans fats. Light or reduced-fat mayonnaise and salad dressings (reduced sodium). Avocado. Safflower, olive, or canola oils. Natural peanut or almond butter. Other Unsalted popcorn and pretzels. The items listed above may not be a complete list of recommended foods or beverages. Contact your dietitian for more options. WHAT FOODS ARE NOT RECOMMENDED? Grains White bread. White pasta. White rice. Refined cornbread. Bagels and croissants. Crackers that contain trans fat. Vegetables Creamed or fried vegetables. Vegetables in a cheese sauce. Regular canned vegetables. Regular canned tomato sauce and paste. Regular tomato and vegetable juices. Fruits Dried fruits. Canned fruit in light or heavy syrup. Fruit juice. Meat and Other Protein Products Fatty cuts of meat. Ribs, chicken wings, bacon, sausage,  bologna, salami, chitterlings, fatback, hot dogs, bratwurst, and packaged luncheon meats. Salted nuts and seeds. Canned beans with salt. Dairy Whole or 2% milk, cream, half-and-half, and  cream cheese. Whole-fat or sweetened yogurt. Full-fat cheeses or blue cheese. Nondairy creamers and whipped toppings. Processed cheese, cheese spreads, or cheese curds. Condiments Onion and garlic salt, seasoned salt, table salt, and sea salt. Canned and packaged gravies. Worcestershire sauce. Tartar sauce. Barbecue sauce. Teriyaki sauce. Soy sauce, including reduced sodium. Steak sauce. Fish sauce. Oyster sauce. Cocktail sauce. Horseradish. Ketchup and mustard. Meat flavorings and tenderizers. Bouillon cubes. Hot sauce. Tabasco sauce. Marinades. Taco seasonings. Relishes. Fats and Oils Butter, stick margarine, lard, shortening, ghee, and bacon fat. Coconut, palm kernel, or palm oils. Regular salad dressings. Other Pickles and olives. Salted popcorn and pretzels. The items listed above may not be a complete list of foods and beverages to avoid. Contact your dietitian for more information. WHERE CAN I FIND MORE INFORMATION? National Heart, Lung, and Blood Institute: [employer].itwww.nhlbi.nih.gov/health/health-topics/topics/dash/ Document Released: 02/24/2011 Document Revised: 07/22/2013 Document Reviewed: 01/09/2013 [facility] Patient Information 2015 [employer] [place]. This information is not intended to replace advice given to you by your health care provider. Make sure you discuss any questions you have with your health care provider.

## 2013-10-30 NOTE — Telephone Encounter (Signed)
Pa faxed

## 2013-11-03 ENCOUNTER — Encounter: Payer: Self-pay | Admitting: Family Medicine

## 2013-11-03 DIAGNOSIS — M25561 Pain in right knee: Secondary | ICD-10-CM | POA: Insufficient documentation

## 2013-11-03 DIAGNOSIS — M25562 Pain in left knee: Secondary | ICD-10-CM

## 2013-11-03 DIAGNOSIS — I739 Peripheral vascular disease, unspecified: Secondary | ICD-10-CM

## 2013-11-03 HISTORY — DX: Peripheral vascular disease, unspecified: I73.9

## 2013-11-03 NOTE — Progress Notes (Signed)
Patient ID: Jeffrey Molina, male   DOB: 10/07/1960, 53 y.o.   MRN: [id number] Jeffrey Molina [id number] 13-Sep-1960 11/03/2013      Progress Note-Follow Up  Subjective  Chief Complaint  Chief Complaint  Patient presents with  . Establish Care    new patient    HPI  Patient is a 53 year old male in today for routine medical care. Who is in today to establish care. His most significant ongoing complaints are his difficulty with his weight and joint pain. He has trouble with both legs. Injured his left leg playing softball in college has had trouble with it ever since. He describes his knee is bone-on-bone. Recently is unremarkable the right knee in the following sports medicine and been diagnosed with popliteal artery entrapment syndrome. He has tried numerous things to lose weight significant pain in his knees and calves when he walks and does not tolerate other exercise at this point. He's had no acute recent illness but he does struggle with rectal dysfunction, dry skin, peripheral vascular disease and has noted a small blood in his stool several times over the last 3-4 months. It is not persistent. There is no pain or straining associated and he has had a colonoscopy in the last 10 years. Denies any change in appetite, anorexia, nausea vomiting.Denies CP/palp/SOB/HA/congestion/fevers GU c/o. Taking meds as prescribed  Past Medical History  Diagnosis Date  . Hypertension   . Chicken pox as a child  . Low testosterone 10/28/2013  . Cutaneous skin tags 10/28/2013  . Anemia 10/28/2013    History reviewed. No pertinent past surgical history.  Family History  Problem Relation Age of Onset  . Fibromyalgia Mother   . Cancer Father     skin cancer  . Cataracts Father   . Arthritis Father   . Diabetes Maternal Grandfather     History   Social History  . Marital Status: Married    Spouse Name: N/A    Number of Children: N/A  . Years of Education: N/A   Occupational History  . Manager  [employer]   Social History Main Topics  . Smoking status: Never Smoker   . Smokeless tobacco: Not on file  . Alcohol Use: No     Comment: very rare. special occasions  . Drug Use: No  . Sexual Activity: Yes     Comment: lives with wife and children, works for [employer]   Other Topics Concern  . Not on file   Social History Narrative  . No narrative on file    No current outpatient prescriptions on file prior to visit.   No current facility-administered medications on file prior to visit.    No Known Allergies  Review of Systems  Review of Systems  Constitutional: Positive for malaise/fatigue. Negative for fever and chills.  HENT: Negative for congestion, hearing loss and nosebleeds.   Eyes: Negative for discharge.  Respiratory: Negative for cough, sputum production, shortness of breath and wheezing.   Cardiovascular: Negative for chest pain, palpitations and leg swelling.  Gastrointestinal: Negative for heartburn, nausea, vomiting, abdominal pain, diarrhea, constipation and blood in stool.  Genitourinary: Negative for dysuria, urgency, frequency and hematuria.  Musculoskeletal: Positive for joint pain. Negative for back pain, falls and myalgias.  Skin: Negative for rash.  Neurological: Negative for dizziness, tremors, sensory change, focal weakness, loss of consciousness, weakness and headaches.  Endo/Heme/Allergies: Negative for polydipsia. Does not bruise/bleed easily.  Psychiatric/Behavioral: Negative for depression and suicidal ideas. The  patient is not nervous/anxious.     Objective  BP 134/86  Pulse 95  Temp(Src) 97.9 F (36.6 C) (Oral)  Ht 6\' 1"  (1.854 m)  Wt 422 lb 1.3 oz (191.454 kg)  BMI 55.70 kg/m2  SpO2 95%  Physical Exam  Physical Exam  Constitutional: He is oriented to person, place, and time and well-developed, well-nourished, and in no distress. No distress.  HENT:  Head: Normocephalic and atraumatic.  Eyes:  Conjunctivae are normal.  Neck: Neck supple. No thyromegaly present.  Cardiovascular: Normal rate, regular rhythm and normal heart sounds.   No murmur heard. Pulmonary/Chest: Effort normal and breath sounds normal. No respiratory distress.  Abdominal: He exhibits no distension and no mass. There is no tenderness.  Musculoskeletal: He exhibits no edema.  Neurological: He is alert and oriented to person, place, and time.  Skin: Skin is warm.  Psychiatric: Memory, affect and judgment normal.    Lab Results  Component Value Date   TSH 4.145 05/26/2013   Lab Results  Component Value Date   WBC 6.3 05/26/2013   HGB 13.8* 05/26/2013   HCT 43.5 05/26/2013   MCV 93.5 05/26/2013   Lab Results  Component Value Date   CREATININE 1.06 05/26/2013   BUN 14 05/26/2013   NA 140 05/26/2013   K 4.5 05/26/2013   CL 102 05/26/2013   CO2 29 05/26/2013   Lab Results  Component Value Date   ALT 30 05/26/2013   AST 22 05/26/2013   ALKPHOS 39 05/26/2013   BILITOT 0.4 05/26/2013   Lab Results  Component Value Date   CHOL 187 05/26/2013   Lab Results  Component Value Date   HDL 30* 05/26/2013   Lab Results  Component Value Date   LDLCALC 132* 05/26/2013   Lab Results  Component Value Date   TRIG 126 05/26/2013   Lab Results  Component Value Date   CHOLHDL 6.2 05/26/2013     Assessment & Plan  HTN (hypertension) Improved with recheck. Poorly controlled will alter medications, encouraged DASH diet, minimize caffeine and obtain adequate sleep. Report concerning symptoms and follow up as directed and as needed  Morbid obesity Encouraged DASH diet, decrease po intake and increase exercise as tolerated. Needs 7-8 hours of sleep nightly. Avoid trans fats, eat small, frequent meals every 4-5 hours with lean proteins, complex carbs and healthy fats. Minimize simple carbs, GMO foods. Patient has been trying to loose weight without success prior to joining our pratice has decided he is ready to pursue bariatric surgery options.  Is referred today and set up for further counseling and weight check here and further counseling. May have this done with surgery instead if they choose that route.  Anemia Mild, Increase leafy greens, consider increased lean red meat and using cast iron cookware. Continue to monitor, report any concerns  Other and unspecified hyperlipidemia Mild, Encouraged heart healthy diet, increase exercise, avoid trans fats, consider a krill oil cap daily  Hyperglycemia hgba1c acceptable, minimize simple carbs. Increase exercise as tolerated.   Knee pain, bilateral Encouraged modest weight loss, topical treatments, weight baring as tolerated.

## 2013-11-03 NOTE — Assessment & Plan Note (Signed)
Mild, Encouraged heart healthy diet, increase exercise, avoid trans fats, consider a krill oil cap daily 

## 2013-11-03 NOTE — Assessment & Plan Note (Signed)
Improved with recheck. Poorly controlled will alter medications, encouraged DASH diet, minimize caffeine and obtain adequate sleep. Report concerning symptoms and follow up as directed and as needed

## 2013-11-03 NOTE — Assessment & Plan Note (Addendum)
Encouraged DASH diet, decrease po intake and increase exercise as tolerated. Needs 7-8 hours of sleep nightly. Avoid trans fats, eat small, frequent meals every 4-5 hours with lean proteins, complex carbs and healthy fats. Minimize simple carbs, GMO foods. Patient has been trying to loose weight without success prior to joining our pratice has decided he is ready to pursue bariatric surgery options. Is referred today and set up for further counseling and weight check here and further counseling. May have this done with surgery instead if they choose that route.

## 2013-11-03 NOTE — Assessment & Plan Note (Signed)
Encouraged modest weight loss, topical treatments, weight baring as tolerated.

## 2013-11-03 NOTE — Assessment & Plan Note (Signed)
Mild, Increase leafy greens, consider increased lean red meat and using cast iron cookware. Continue to monitor, report any concerns 

## 2013-11-03 NOTE — Assessment & Plan Note (Signed)
hgba1c acceptable, minimize simple carbs. Increase exercise as tolerated.  

## 2013-11-04 NOTE — Telephone Encounter (Signed)
PA approved for Viagra effective 10-01-13 through 10-30-16

## 2013-11-29 ENCOUNTER — Encounter: Payer: Self-pay | Admitting: Family Medicine

## 2013-11-29 ENCOUNTER — Ambulatory Visit (INDEPENDENT_AMBULATORY_CARE_PROVIDER_SITE_OTHER): Payer: BC Managed Care – PPO | Admitting: Family Medicine

## 2013-11-29 VITALS — BP 135/85 | HR 87 | Temp 98.4°F | Ht 73.0 in | Wt >= 6400 oz

## 2013-11-29 DIAGNOSIS — I10 Essential (primary) hypertension: Secondary | ICD-10-CM

## 2013-11-29 DIAGNOSIS — M25561 Pain in right knee: Secondary | ICD-10-CM

## 2013-11-29 DIAGNOSIS — M25562 Pain in left knee: Secondary | ICD-10-CM

## 2013-11-29 DIAGNOSIS — M25569 Pain in unspecified knee: Secondary | ICD-10-CM

## 2013-11-29 DIAGNOSIS — R7309 Other abnormal glucose: Secondary | ICD-10-CM

## 2013-11-29 DIAGNOSIS — R739 Hyperglycemia, unspecified: Secondary | ICD-10-CM

## 2013-11-29 NOTE — Patient Instructions (Signed)
DASH Eating Plan °DASH stands for "Dietary Approaches to Stop Hypertension." The DASH eating plan is a healthy eating plan that has been shown to reduce high blood pressure (hypertension). Additional health benefits may include reducing the risk of type 2 diabetes mellitus, heart disease, and stroke. The DASH eating plan may also help with weight loss. °WHAT DO I NEED TO KNOW ABOUT THE DASH EATING PLAN? °For the DASH eating plan, you will follow these general guidelines: °· Choose foods with a percent daily value for sodium of less than 5% (as listed on the food label). °· Use salt-free seasonings or herbs instead of table salt or sea salt. °· Check with your health care provider or pharmacist before using salt substitutes. °· Eat lower-sodium products, often labeled as "lower sodium" or "no salt added." °· Eat fresh foods. °· Eat more vegetables, fruits, and low-fat dairy products. °· Choose whole grains. Look for the word "whole" as the first word in the ingredient list. °· Choose fish and skinless chicken or turkey more often than red meat. Limit fish, poultry, and meat to 6 oz (170 g) each day. °· Limit sweets, desserts, sugars, and sugary drinks. °· Choose heart-healthy fats. °· Limit cheese to 1 oz (28 g) per day. °· Eat more home-cooked food and less restaurant, buffet, and fast food. °· Limit fried foods. °· Cook foods using methods other than frying. °· Limit canned vegetables. If you do use them, rinse them well to decrease the sodium. °· When eating at a restaurant, ask that your food be prepared with less salt, or no salt if possible. °WHAT FOODS CAN I EAT? °Seek help from a dietitian for individual calorie needs. °Grains °Whole grain or whole wheat bread. Brown rice. Whole grain or whole wheat pasta. Quinoa, bulgur, and whole grain cereals. Low-sodium cereals. Corn or whole wheat flour tortillas. Whole grain cornbread. Whole grain crackers. Low-sodium crackers. °Vegetables °Fresh or frozen vegetables  (raw, steamed, roasted, or grilled). Low-sodium or reduced-sodium tomato and vegetable juices. Low-sodium or reduced-sodium tomato sauce and paste. Low-sodium or reduced-sodium canned vegetables.  °Fruits °All fresh, canned (in natural juice), or frozen fruits. °Meat and Other Protein Products °Ground beef (85% or leaner), grass-fed beef, or beef trimmed of fat. Skinless chicken or turkey. Ground chicken or turkey. Pork trimmed of fat. All fish and seafood. Eggs. Dried beans, peas, or lentils. Unsalted nuts and seeds. Unsalted canned beans. °Dairy °Low-fat dairy products, such as skim or 1% milk, 2% or reduced-fat cheeses, low-fat ricotta or cottage cheese, or plain low-fat yogurt. Low-sodium or reduced-sodium cheeses. °Fats and Oils °Tub margarines without trans fats. Light or reduced-fat mayonnaise and salad dressings (reduced sodium). Avocado. Safflower, olive, or canola oils. Natural peanut or almond butter. °Other °Unsalted popcorn and pretzels. °The items listed above may not be a complete list of recommended foods or beverages. Contact your dietitian for more options. °WHAT FOODS ARE NOT RECOMMENDED? °Grains °White bread. White pasta. White rice. Refined cornbread. Bagels and croissants. Crackers that contain trans fat. °Vegetables °Creamed or fried vegetables. Vegetables in a cheese sauce. Regular canned vegetables. Regular canned tomato sauce and paste. Regular tomato and vegetable juices. °Fruits °Dried fruits. Canned fruit in light or heavy syrup. Fruit juice. °Meat and Other Protein Products °Fatty cuts of meat. Ribs, chicken wings, bacon, sausage, bologna, salami, chitterlings, fatback, hot dogs, bratwurst, and packaged luncheon meats. Salted nuts and seeds. Canned beans with salt. °Dairy °Whole or 2% milk, cream, half-and-half, and cream cheese. Whole-fat or sweetened yogurt. Full-fat   cheeses or blue cheese. Nondairy creamers and whipped toppings. Processed cheese, cheese spreads, or cheese  curds. °Condiments °Onion and garlic salt, seasoned salt, table salt, and sea salt. Canned and packaged gravies. Worcestershire sauce. Tartar sauce. Barbecue sauce. Teriyaki sauce. Soy sauce, including reduced sodium. Steak sauce. Fish sauce. Oyster sauce. Cocktail sauce. Horseradish. Ketchup and mustard. Meat flavorings and tenderizers. Bouillon cubes. Hot sauce. Tabasco sauce. Marinades. Taco seasonings. Relishes. °Fats and Oils °Butter, stick margarine, lard, shortening, ghee, and bacon fat. Coconut, palm kernel, or palm oils. Regular salad dressings. °Other °Pickles and olives. Salted popcorn and pretzels. °The items listed above may not be a complete list of foods and beverages to avoid. Contact your dietitian for more information. °WHERE CAN I FIND MORE INFORMATION? °National Heart, Lung, and [employer] Institute: www.nhlbi.nih.gov/health/health-topics/topics/dash/ °Document Released: 02/24/2011 Document Revised: 07/22/2013 Document Reviewed: 01/09/2013 °ExitCare® Patient Information ©2015 [employer]. This information is not intended to replace advice given to you by your health care provider. Make sure you discuss any questions you have with your health care provider. ° °

## 2013-11-29 NOTE — Progress Notes (Signed)
Pre visit review using our clinic review tool, if applicable. No additional management support is needed unless otherwise documented below in the visit note. 

## 2013-12-02 ENCOUNTER — Encounter: Payer: Self-pay | Admitting: Family Medicine

## 2013-12-02 NOTE — Assessment & Plan Note (Signed)
Encouraged DASH diet, decrease po intake and increase exercise as tolerated. Needs 7-8 hours of sleep nightly. Avoid trans fats, eat small, frequent meals every 4-5 hours with lean proteins, complex carbs and healthy fats. Minimize simple carbs. Has to go for bypass seminar prior to surgery

## 2013-12-02 NOTE — Assessment & Plan Note (Signed)
Well controlled, no changes to meds. Encouraged heart healthy diet such as the DASH diet and exercise as tolerated.  °

## 2013-12-02 NOTE — Progress Notes (Signed)
Patient ID: Jeffrey Molina, male   DOB: 1960-10-09, 53 y.o.   MRN: [id number] Jeffrey Molina [id number] 06/10/1960 12/02/2013      Progress Note-Follow Up  Subjective  Chief Complaint  Chief Complaint  Patient presents with  . Follow-up    4 week    HPI  Patient is a 53 year old male in today for routine medical care. Patient is in today for followup. He has been trying to decrease his carbohydrate intake. Has not exercise routinely no recent illness. Continues to struggle with knee pain and joint pain. Denies CP/palp/SOB/HA/congestion/fevers/GI or GU c/o. Taking meds as prescribed  Past Medical History  Diagnosis Date  . Hypertension   . Chicken pox as a child  . Low testosterone 10/28/2013  . Cutaneous skin tags 10/28/2013  . Anemia 10/28/2013  . Peripheral artery disease 11/03/2013    History reviewed. No pertinent past surgical history.  Family History  Problem Relation Age of Onset  . Fibromyalgia Mother   . Cancer Father     skin cancer  . Cataracts Father   . Arthritis Father   . Diabetes Maternal Grandfather     History   Social History  . Marital Status: Married    Spouse Name: N/A    Number of Children: N/A  . Years of Education: N/A   Occupational History  . Manager [employer]   Social History Main Topics  . Smoking status: Never Smoker   . Smokeless tobacco: Not on file  . Alcohol Use: No     Comment: very rare. special occasions  . Drug Use: No  . Sexual Activity: Yes     Comment: lives with wife and children, works for [employer]   Other Topics Concern  . Not on file   Social History Narrative  . No narrative on file    Current Outpatient Prescriptions on File Prior to Visit  Medication Sig Dispense Refill  . sildenafil (VIAGRA) 100 MG tablet Take 0.5-1 tablets (50-100 mg total) by mouth daily as needed for erectile dysfunction.  9 tablet  5   No current facility-administered medications on file prior to visit.     No Known Allergies  Review of Systems  Review of Systems  Constitutional: Negative for fever and malaise/fatigue.  HENT: Negative for congestion.   Eyes: Negative for pain and discharge.  Respiratory: Negative for shortness of breath.   Cardiovascular: Negative for chest pain, palpitations and leg swelling.  Gastrointestinal: Negative for nausea, abdominal pain and diarrhea.  Genitourinary: Negative for dysuria.  Musculoskeletal: Positive for joint pain. Negative for falls.  Skin: Negative for rash.  Neurological: Negative for loss of consciousness and headaches.  Endo/Heme/Allergies: Negative for polydipsia.  Psychiatric/Behavioral: Negative for depression and suicidal ideas. The patient is not nervous/anxious and does not have insomnia.     Objective  BP 135/85  Pulse 87  Temp(Src) 98.4 F (36.9 C) (Oral)  Ht  (1.854 m)  Wt 424 lb 6.4 oz (192.507 kg)  BMI 56.01 kg/m2  SpO2 97%  Physical Exam  Physical Exam  Constitutional: He is oriented to person, place, and time and well-developed, well-nourished, and in no distress. No distress.  HENT:  Head: Normocephalic and atraumatic.  Eyes: Conjunctivae are normal.  Neck: Neck supple. No thyromegaly present.  Cardiovascular: Normal rate, regular rhythm and normal heart sounds.   No murmur heard. Pulmonary/Chest: Effort normal and breath sounds normal. No respiratory distress.  Abdominal: He exhibits no distension and no  mass. There is no tenderness.  Musculoskeletal: He exhibits no edema.  Neurological: He is alert and oriented to person, place, and time.  Skin: Skin is warm.  Psychiatric: Memory, affect and judgment normal.    Lab Results  Component Value Date   TSH 4.145 05/26/2013   Lab Results  Component Value Date   WBC 6.3 05/26/2013   HGB 13.8* 05/26/2013   HCT 43.5 05/26/2013   MCV 93.5 05/26/2013   Lab Results  Component Value Date   CREATININE 1.06 05/26/2013   BUN 14 05/26/2013   NA 140 05/26/2013   K  4.5 05/26/2013   CL 102 05/26/2013   CO2 29 05/26/2013   Lab Results  Component Value Date   ALT 30 05/26/2013   AST 22 05/26/2013   ALKPHOS 39 05/26/2013   BILITOT 0.4 05/26/2013   Lab Results  Component Value Date   CHOL 187 05/26/2013   Lab Results  Component Value Date   HDL 30* 05/26/2013   Lab Results  Component Value Date   LDLCALC 132* 05/26/2013   Lab Results  Component Value Date   TRIG 126 05/26/2013   Lab Results  Component Value Date   CHOLHDL 6.2 05/26/2013     Assessment & Plan  HTN (hypertension) Well controlled, no changes to meds. Encouraged heart healthy diet such as the DASH diet and exercise as tolerated.   Morbid obesity Encouraged DASH diet, decrease po intake and increase exercise as tolerated. Needs 7-8 hours of sleep nightly. Avoid trans fats, eat small, frequent meals every 4-5 hours with lean proteins, complex carbs and healthy fats. Minimize simple carbs. Has to go for bypass seminar prior to surgery   Knee pain, bilateral encouraged weight loss as a means to knee pain, activity as tolerated  Hyperglycemia hgba1c acceptable, minimize simple carbs. Increase exercise as tolerated.

## 2013-12-02 NOTE — Assessment & Plan Note (Signed)
hgba1c acceptable, minimize simple carbs. Increase exercise as tolerated.  

## 2013-12-02 NOTE — Assessment & Plan Note (Signed)
encouraged weight loss as a means to knee pain, activity as tolerated

## 2013-12-03 ENCOUNTER — Telehealth: Payer: Self-pay

## 2013-12-03 NOTE — Telephone Encounter (Signed)
Jeffrey Molina called back and I let him know what was in the notes down below, and he is going to go to [employer] site and sign up for a seminar

## 2013-12-03 NOTE — Telephone Encounter (Signed)
Left patient another message

## 2013-12-03 NOTE — Telephone Encounter (Signed)
Message copied by Court Joy on Tue Dec 03, 2013  8:22 AM ------      Message from: Danise Edge A      Created: Mon Dec 02, 2013  8:21 PM       Notify he has not heard from surgery because it turns out they expect him to go to one of the gastric bypass seminars at [facility] or long before they will accept him, I did not know this. He can find out whne they are on the website I believe. ------

## 2013-12-03 NOTE — Telephone Encounter (Signed)
Left a message for patient to return our call.

## 2013-12-03 NOTE — Telephone Encounter (Signed)
Pt returned your call.  

## 2013-12-11 ENCOUNTER — Encounter: Payer: Self-pay | Admitting: Family Medicine

## 2013-12-12 ENCOUNTER — Telehealth: Payer: Self-pay

## 2013-12-12 MED ORDER — SILDENAFIL CITRATE 20 MG PO TABS: 20.0000 mg | ORAL_TABLET | ORAL | Status: DC

## 2013-12-12 NOTE — Telephone Encounter (Signed)
RX sent per md

## 2013-12-27 ENCOUNTER — Ambulatory Visit (INDEPENDENT_AMBULATORY_CARE_PROVIDER_SITE_OTHER): Payer: BC Managed Care – PPO | Admitting: Family Medicine

## 2013-12-27 VITALS — BP 153/92 | HR 94 | Temp 97.7°F | Ht 73.0 in | Wt >= 6400 oz

## 2013-12-27 DIAGNOSIS — M25562 Pain in left knee: Secondary | ICD-10-CM

## 2013-12-27 DIAGNOSIS — R739 Hyperglycemia, unspecified: Secondary | ICD-10-CM

## 2013-12-27 DIAGNOSIS — I10 Essential (primary) hypertension: Secondary | ICD-10-CM

## 2013-12-27 DIAGNOSIS — M25561 Pain in right knee: Secondary | ICD-10-CM

## 2013-12-27 DIAGNOSIS — N529 Male erectile dysfunction, unspecified: Secondary | ICD-10-CM

## 2013-12-27 DIAGNOSIS — E785 Hyperlipidemia, unspecified: Secondary | ICD-10-CM

## 2013-12-27 LAB — CBC
HCT: 44.7 % (ref 39.0–52.0)
HEMOGLOBIN: 14.6 g/dL (ref 13.0–17.0)
MCHC: 32.6 g/dL (ref 30.0–36.0)
MCV: 90.2 fl (ref 78.0–100.0)
Platelets: 316 10*3/uL (ref 150.0–400.0)
RBC: 4.96 Mil/uL (ref 4.22–5.81)
RDW: 14.6 % (ref 11.5–15.5)
WBC: 6.9 10*3/uL (ref 4.0–10.5)

## 2013-12-27 LAB — LIPID PANEL
CHOLESTEROL: 182 mg/dL (ref 0–200)
HDL: 27.2 mg/dL — ABNORMAL LOW (ref >39.00)
LDL Cholesterol: 120 mg/dL — ABNORMAL HIGH (ref 0–99)
NonHDL: 154.8
Total CHOL/HDL Ratio: 7
Triglycerides: 175 mg/dL — ABNORMAL HIGH (ref 0.0–149.0)
VLDL: 35 mg/dL (ref 0.0–40.0)

## 2013-12-27 LAB — HEPATIC FUNCTION PANEL
ALT: 31 U/L (ref 0–53)
AST: 21 U/L (ref 0–37)
Albumin: 3.4 g/dL — ABNORMAL LOW (ref 3.5–5.2)
Alkaline Phosphatase: 41 U/L (ref 39–117)
BILIRUBIN TOTAL: 0.6 mg/dL (ref 0.2–1.2)
Bilirubin, Direct: 0 mg/dL (ref 0.0–0.3)
Total Protein: 7.6 g/dL (ref 6.0–8.3)

## 2013-12-27 LAB — RENAL FUNCTION PANEL
ALBUMIN: 3.4 g/dL — AB (ref 3.5–5.2)
BUN: 17 mg/dL (ref 6–23)
CO2: 24 mEq/L (ref 19–32)
Calcium: 9 mg/dL (ref 8.4–10.5)
Chloride: 105 mEq/L (ref 96–112)
Creatinine, Ser: 1.1 mg/dL (ref 0.4–1.5)
GFR: 78.09 mL/min (ref >60.00)
GLUCOSE: 129 mg/dL — AB (ref 70–99)
PHOSPHORUS: 3.1 mg/dL (ref 2.3–4.6)
POTASSIUM: 4 meq/L (ref 3.5–5.1)
Sodium: 136 mEq/L (ref 135–145)

## 2013-12-27 LAB — TSH: TSH: 2.12 u[IU]/mL (ref 0.35–4.50)

## 2013-12-27 LAB — HEMOGLOBIN A1C: Hgb A1c MFr Bld: 6.5 % (ref 4.6–6.5)

## 2013-12-27 NOTE — Patient Instructions (Signed)

## 2013-12-27 NOTE — Progress Notes (Signed)
Pre visit review using our clinic review tool, if applicable. No additional management support is needed unless otherwise documented below in the visit note. 

## 2013-12-29 ENCOUNTER — Encounter: Payer: Self-pay | Admitting: Family Medicine

## 2013-12-29 NOTE — Assessment & Plan Note (Addendum)
Encouraged DASH diet, decrease po intake and increase exercise as tolerated. Needs 7-8 hours of sleep nightly. Avoid trans fats, eat small, frequent meals every 4-5 hours with lean proteins, complex carbs and healthy fats. Minimize simple carbs which unfortunately he has not been doing.

## 2013-12-29 NOTE — Assessment & Plan Note (Signed)
Notes some crepitus upon arising b/l and some pain/eakness but no recent trauma.

## 2013-12-29 NOTE — Progress Notes (Signed)
Patient ID: Jeffrey Molina, male   DOB: Apr 09, 1960, 53 y.o.   MRN: [id number] Jeffrey Molina [id number] Apr 09, 1960 12/29/2013      Progress Note-Follow Up  Subjective  Chief Complaint  Chief Complaint  Patient presents with  . Follow-up    4 week    HPI  Patient is a 53 year old male in today for routine medical care. He acknowledges he has been stressed and not following an adequate diet. No significant exercise. No recent illness. Becomes easily winded with any exertion. Denies CP/palp/HA/congestion/fevers/GI or GU c/o. Taking meds as prescribed  Past Medical History  Diagnosis Date  . Hypertension   . Chicken pox as a child  . Low testosterone 10/28/2013  . Cutaneous skin tags 10/28/2013  . Anemia 10/28/2013  . Peripheral artery disease 11/03/2013    History reviewed. No pertinent past surgical history.  Family History  Problem Relation Age of Onset  . Fibromyalgia Mother   . Cancer Father     skin cancer  . Cataracts Father   . Arthritis Father   . Diabetes Maternal Grandfather     History   Social History  . Marital Status: Married    Spouse Name: N/A    Number of Children: N/A  . Years of Education: N/A   Occupational History  . Manager [employer]   Social History Main Topics  . Smoking status: Never Smoker   . Smokeless tobacco: Not on file  . Alcohol Use: No     Comment: very rare. special occasions  . Drug Use: No  . Sexual Activity: Yes     Comment: lives with wife and children, works for [employer]   Other Topics Concern  . Not on file   Social History Narrative  . No narrative on file    Current Outpatient Prescriptions on File Prior to Visit  Medication Sig Dispense Refill  . sildenafil (REVATIO) 20 MG tablet Take 1 tablet (20 mg total) by mouth as directed. 1-4 tablet po daily prn for ED  50 tablet  0   No current facility-administered medications on file prior to visit.    No Known Allergies  Review of  Systems  Review of Systems  Constitutional: Negative for fever and malaise/fatigue.  HENT: Negative for congestion.   Eyes: Negative for discharge.  Respiratory: Negative for shortness of breath.   Cardiovascular: Negative for chest pain, palpitations and leg swelling.  Gastrointestinal: Negative for nausea, abdominal pain and diarrhea.  Genitourinary: Negative for dysuria.  Musculoskeletal: Negative for falls.  Skin: Negative for rash.  Neurological: Negative for loss of consciousness and headaches.  Endo/Heme/Allergies: Negative for polydipsia.  Psychiatric/Behavioral: Negative for depression and suicidal ideas. The patient is not nervous/anxious and does not have insomnia.     Objective  BP 153/92  Pulse 94  Temp(Src) 97.7 F (36.5 C) (Oral)  Ht 6\' 1"  (1.854 m)  Wt 427 lb 3.2 oz (193.777 kg)  BMI 56.37 kg/m2  SpO2 97%  Physical Exam  Physical Exam  Constitutional: He is oriented to person, place, and time and well-developed, well-nourished, and in no distress. No distress.  HENT:  Head: Normocephalic and atraumatic.  Eyes: Conjunctivae are normal.  Neck: Neck supple. No thyromegaly present.  Cardiovascular: Normal rate, regular rhythm and normal heart sounds.   No murmur heard. Pulmonary/Chest: Effort normal and breath sounds normal. No respiratory distress.  Abdominal: He exhibits no distension and no mass. There is no tenderness.  Musculoskeletal: He exhibits no  edema.  Neurological: He is alert and oriented to person, place, and time.  Skin: Skin is warm.  Psychiatric: Memory, affect and judgment normal.    Lab Results  Component Value Date   TSH 2.12 12/27/2013   Lab Results  Component Value Date   WBC 6.9 12/27/2013   HGB 14.6 12/27/2013   HCT 44.7 12/27/2013   MCV 90.2 12/27/2013   PLT 316.0 12/27/2013   Lab Results  Component Value Date   CREATININE 1.1 12/27/2013   BUN 17 12/27/2013   NA 136 12/27/2013   K 4.0 12/27/2013   CL 105 12/27/2013   CO2 24  12/27/2013   Lab Results  Component Value Date   ALT 31 12/27/2013   AST 21 12/27/2013   ALKPHOS 41 12/27/2013   BILITOT 0.6 12/27/2013   Lab Results  Component Value Date   CHOL 182 12/27/2013   Lab Results  Component Value Date   HDL 27.20* 12/27/2013   Lab Results  Component Value Date   LDLCALC 120* 12/27/2013   Lab Results  Component Value Date   TRIG 175.0* 12/27/2013   Lab Results  Component Value Date   CHOLHDL 7 12/27/2013     Assessment & Plan  Knee pain, bilateral Notes some crepitus upon arising b/l and some pain/eakness but no recent trauma.   Morbid obesity Encouraged DASH diet, decrease po intake and increase exercise as tolerated. Needs 7-8 hours of sleep nightly. Avoid trans fats, eat small, frequent meals every 4-5 hours with lean proteins, complex carbs and healthy fats. Minimize simple carbs which unfortunately he has not been doing.   Hyperglycemia hgba1c acceptable, minimize simple carbs. Increase exercise as tolerated.  Hyperlipidemia Encouraged heart healthy diet, increase exercise, avoid trans fats, consider a krill oil cap daily  Erectile dysfunction Tolerating Sildenafil but due to deconditioning is unable to engage in activity frequently.

## 2013-12-29 NOTE — Assessment & Plan Note (Signed)
Tolerating Sildenafil but due to deconditioning is unable to engage in activity frequently.

## 2013-12-29 NOTE — Assessment & Plan Note (Signed)
hgba1c acceptable, minimize simple carbs. Increase exercise as tolerated.  

## 2013-12-29 NOTE — Assessment & Plan Note (Signed)
Encouraged heart healthy diet, increase exercise, avoid trans fats, consider a krill oil cap daily 

## 2013-12-30 ENCOUNTER — Encounter: Payer: Self-pay | Admitting: Family Medicine

## 2014-01-27 ENCOUNTER — Ambulatory Visit (INDEPENDENT_AMBULATORY_CARE_PROVIDER_SITE_OTHER): Payer: BC Managed Care – PPO | Admitting: Family Medicine

## 2014-01-27 ENCOUNTER — Encounter: Payer: Self-pay | Admitting: Family Medicine

## 2014-01-27 VITALS — BP 150/78 | HR 85 | Temp 97.5°F | Ht 73.0 in | Wt >= 6400 oz

## 2014-01-27 DIAGNOSIS — E1169 Type 2 diabetes mellitus with other specified complication: Secondary | ICD-10-CM

## 2014-01-27 DIAGNOSIS — E669 Obesity, unspecified: Secondary | ICD-10-CM

## 2014-01-27 DIAGNOSIS — N529 Male erectile dysfunction, unspecified: Secondary | ICD-10-CM

## 2014-01-27 DIAGNOSIS — M25561 Pain in right knee: Secondary | ICD-10-CM

## 2014-01-27 DIAGNOSIS — M25562 Pain in left knee: Secondary | ICD-10-CM

## 2014-01-27 DIAGNOSIS — E119 Type 2 diabetes mellitus without complications: Secondary | ICD-10-CM

## 2014-01-27 DIAGNOSIS — I10 Essential (primary) hypertension: Secondary | ICD-10-CM

## 2014-01-27 HISTORY — DX: Type 2 diabetes mellitus with other specified complication: E11.69

## 2014-01-27 HISTORY — DX: Type 2 diabetes mellitus with other specified complication: E66.9

## 2014-01-27 MED ORDER — LISINOPRIL 10 MG PO TABS: 10.0000 mg | ORAL_TABLET | Freq: Every day | ORAL | Status: DC

## 2014-01-27 NOTE — Patient Instructions (Signed)
DASH Eating Plan °DASH stands for "Dietary Approaches to Stop Hypertension." The DASH eating plan is a healthy eating plan that has been shown to reduce high blood pressure (hypertension). Additional health benefits may include reducing the risk of type 2 diabetes mellitus, heart disease, and stroke. The DASH eating plan may also help with weight loss. °WHAT DO I NEED TO KNOW ABOUT THE DASH EATING PLAN? °For the DASH eating plan, you will follow these general guidelines: °· Choose foods with a percent daily value for sodium of less than 5% (as listed on the food label). °· Use salt-free seasonings or herbs instead of table salt or sea salt. °· Check with your health care provider or pharmacist before using salt substitutes. °· Eat lower-sodium products, often labeled as "lower sodium" or "no salt added." °· Eat fresh foods. °· Eat more vegetables, fruits, and low-fat dairy products. °· Choose whole grains. Look for the word "whole" as the first word in the ingredient list. °· Choose fish and skinless chicken or turkey more often than red meat. Limit fish, poultry, and meat to 6 oz (170 g) each day. °· Limit sweets, desserts, sugars, and sugary drinks. °· Choose heart-healthy fats. °· Limit cheese to 1 oz (28 g) per day. °· Eat more home-cooked food and less restaurant, buffet, and fast food. °· Limit fried foods. °· Cook foods using methods other than frying. °· Limit canned vegetables. If you do use them, rinse them well to decrease the sodium. °· When eating at a restaurant, ask that your food be prepared with less salt, or no salt if possible. °WHAT FOODS CAN I EAT? °Seek help from a dietitian for individual calorie needs. °Grains °Whole grain or whole wheat bread. Brown rice. Whole grain or whole wheat pasta. Quinoa, bulgur, and whole grain cereals. Low-sodium cereals. Corn or whole wheat flour tortillas. Whole grain cornbread. Whole grain crackers. Low-sodium crackers. °Vegetables °Fresh or frozen vegetables  (raw, steamed, roasted, or grilled). Low-sodium or reduced-sodium tomato and vegetable juices. Low-sodium or reduced-sodium tomato sauce and paste. Low-sodium or reduced-sodium canned vegetables.  °Fruits °All fresh, canned (in natural juice), or frozen fruits. °Meat and Other Protein Products °Ground beef (85% or leaner), grass-fed beef, or beef trimmed of fat. Skinless chicken or turkey. Ground chicken or turkey. Pork trimmed of fat. All fish and seafood. Eggs. Dried beans, peas, or lentils. Unsalted nuts and seeds. Unsalted canned beans. °Dairy °Low-fat dairy products, such as skim or 1% milk, 2% or reduced-fat cheeses, low-fat ricotta or cottage cheese, or plain low-fat yogurt. Low-sodium or reduced-sodium cheeses. °Fats and Oils °Tub margarines without trans fats. Light or reduced-fat mayonnaise and salad dressings (reduced sodium). Avocado. Safflower, olive, or canola oils. Natural peanut or almond butter. °Other °Unsalted popcorn and pretzels. °The items listed above may not be a complete list of recommended foods or beverages. Contact your dietitian for more options. °WHAT FOODS ARE NOT RECOMMENDED? °Grains °White bread. White pasta. White rice. Refined cornbread. Bagels and croissants. Crackers that contain trans fat. °Vegetables °Creamed or fried vegetables. Vegetables in a cheese sauce. Regular canned vegetables. Regular canned tomato sauce and paste. Regular tomato and vegetable juices. °Fruits °Dried fruits. Canned fruit in light or heavy syrup. Fruit juice. °Meat and Other Protein Products °Fatty cuts of meat. Ribs, chicken wings, bacon, sausage, bologna, salami, chitterlings, fatback, hot dogs, bratwurst, and packaged luncheon meats. Salted nuts and seeds. Canned beans with salt. °Dairy °Whole or 2% milk, cream, half-and-half, and cream cheese. Whole-fat or sweetened yogurt. Full-fat   cheeses or blue cheese. Nondairy creamers and whipped toppings. Processed cheese, cheese spreads, or cheese  curds. °Condiments °Onion and garlic salt, seasoned salt, table salt, and sea salt. Canned and packaged gravies. Worcestershire sauce. Tartar sauce. Barbecue sauce. Teriyaki sauce. Soy sauce, including reduced sodium. Steak sauce. Fish sauce. Oyster sauce. Cocktail sauce. Horseradish. Ketchup and mustard. Meat flavorings and tenderizers. Bouillon cubes. Hot sauce. Tabasco sauce. Marinades. Taco seasonings. Relishes. °Fats and Oils °Butter, stick margarine, lard, shortening, ghee, and bacon fat. Coconut, palm kernel, or palm oils. Regular salad dressings. °Other °Pickles and olives. Salted popcorn and pretzels. °The items listed above may not be a complete list of foods and beverages to avoid. Contact your dietitian for more information. °WHERE CAN I FIND MORE INFORMATION? °National Heart, Lung, and [employer] Institute: www.nhlbi.nih.gov/health/health-topics/topics/dash/ °Document Released: 02/24/2011 Document Revised: 07/22/2013 Document Reviewed: 01/09/2013 °ExitCare® Patient Information ©2015 [employer]. This information is not intended to replace advice given to you by your health care provider. Make sure you discuss any questions you have with your health care provider. ° °

## 2014-01-27 NOTE — Progress Notes (Signed)
Pre visit review using our clinic review tool, if applicable. No additional management support is needed unless otherwise documented below in the visit note. 

## 2014-01-27 NOTE — Assessment & Plan Note (Signed)
Start Lisinopril today and minimize sodium

## 2014-01-27 NOTE — Assessment & Plan Note (Signed)
W[facility]. Encouraged DASH diet, decrease po intake and increase exercise as tolerated. Needs 7-8 hours of sleep nightly. Avoid trans fats, eat small, frequent meals every 4-5 hours with lean proteins, complex carbs and healthy fats. Minimize simple carbs, GMO foods.

## 2014-01-27 NOTE — Assessment & Plan Note (Addendum)
Given rx for glucometer today for A1C of 6.5, will need to minimize simple carbs. Needs to check sugars daily and prn,

## 2014-01-27 NOTE — Progress Notes (Signed)
Patient ID: Jeffrey Molina, male   DOB: 1960/09/03, 53 y.o.   MRN: [id number] Jeffrey Molina [id number] 1960/09/03 01/27/2014      Progress Note-Follow Up  Subjective  Chief Complaint  Chief Complaint  Patient presents with  . Follow-up    1 month    HPI  Patient is a 53 year old male in today for routine medical care. He continues to work long hours and not follow a comprehensive exercise or diet regimen. He struggles daily with knee, ankle pain which is very debilitating. He struggles with some hip and back pain as well. No recent injury. No recent illness. [facility] bypass and is likely to proceed. Denies CP/palp/SOB/HA/congestion/fevers/GI or GU c/o. Taking meds as prescribed  Past Medical History  Diagnosis Date  . Hypertension   . Chicken pox as a child  . Low testosterone 10/28/2013  . Cutaneous skin tags 10/28/2013  . Anemia 10/28/2013  . Peripheral artery disease 11/03/2013    History reviewed. No pertinent past surgical history.  Family History  Problem Relation Age of Onset  . Fibromyalgia Mother   . Cancer Father     skin cancer  . Cataracts Father   . Arthritis Father   . Diabetes Maternal Grandfather     History   Social History  . Marital Status: Married    Spouse Name: N/A    Number of Children: N/A  . Years of Education: N/A   Occupational History  . Manager [employer]   Social History Main Topics  . Smoking status: Never Smoker   . Smokeless tobacco: Not on file  . Alcohol Use: No     Comment: very rare. special occasions  . Drug Use: No  . Sexual Activity: Yes     Comment: lives with wife and children, works for [employer]   Other Topics Concern  . Not on file   Social History Narrative    Current Outpatient Prescriptions on File Prior to Visit  Medication Sig Dispense Refill  . sildenafil (REVATIO) 20 MG tablet Take 1 tablet (20 mg total) by mouth as directed. 1-4 tablet po daily  prn for ED 50 tablet 0   No current facility-administered medications on file prior to visit.    No Known Allergies  Review of Systems  ROS  Objective  BP 150/78 mmHg  Pulse 85  Temp(Src) 97.5 F (36.4 C) (Oral)  Ht 6\' 1"  (1.854 m)  Wt 426 lb 12.8 oz (193.595 kg)  BMI 56.32 kg/m2  SpO2 98%  Physical Exam  Physical Exam  Constitutional: He is oriented to person, place, and time and well-developed, well-nourished, and in no distress. No distress.  HENT:  Head: Normocephalic and atraumatic.  Eyes: Conjunctivae are normal.  Neck: Neck supple. No thyromegaly present.  Cardiovascular: Normal rate, regular rhythm and normal heart sounds.   No murmur heard. Pulmonary/Chest: Effort normal and breath sounds normal. No respiratory distress.  Abdominal: He exhibits no distension and no mass. There is no tenderness.  Musculoskeletal: He exhibits no edema.  Neurological: He is alert and oriented to person, place, and time.  Skin: Skin is warm.  Psychiatric: Memory, affect and judgment normal.    Lab Results  Component Value Date   TSH 2.12 12/27/2013   Lab Results  Component Value Date   WBC 6.9 12/27/2013   HGB 14.6 12/27/2013   HCT 44.7 12/27/2013   MCV 90.2 12/27/2013   PLT 316.0  12/27/2013   Lab Results  Component Value Date   CREATININE 1.1 12/27/2013   BUN 17 12/27/2013   NA 136 12/27/2013   K 4.0 12/27/2013   CL 105 12/27/2013   CO2 24 12/27/2013   Lab Results  Component Value Date   ALT 31 12/27/2013   AST 21 12/27/2013   ALKPHOS 41 12/27/2013   BILITOT 0.6 12/27/2013   Lab Results  Component Value Date   CHOL 182 12/27/2013   Lab Results  Component Value Date   HDL 27.20* 12/27/2013   Lab Results  Component Value Date   LDLCALC 120* 12/27/2013   Lab Results  Component Value Date   TRIG 175.0* 12/27/2013   Lab Results  Component Value Date   CHOLHDL 7 12/27/2013     Assessment & Plan  HTN (hypertension) Start Lisinopril today and  minimize sodium  Morbid obesity [facility]. Encouraged DASH diet, decrease po intake and increase exercise as tolerated. Needs 7-8 hours of sleep nightly. Avoid trans fats, eat small, frequent meals every 4-5 hours with lean proteins, complex carbs and healthy fats. Minimize simple carbs, GMO foods.  Diabetes mellitus type 2 in obese Given rx for glucometer today for A1C of 6.5, will need to minimize simple carbs. Needs to check sugars daily and prn,   Erectile dysfunction Tolerating Revatio  Knee pain, bilateral Encouraged compression hose, Salon Pas gel prn. Consider referral if worsens

## 2014-01-29 NOTE — Assessment & Plan Note (Signed)
Encouraged compression hose, Salon Pas gel prn. Consider referral if worsens

## 2014-01-29 NOTE — Assessment & Plan Note (Signed)
Tolerating Revatio

## 2014-02-27 ENCOUNTER — Ambulatory Visit (INDEPENDENT_AMBULATORY_CARE_PROVIDER_SITE_OTHER): Payer: BC Managed Care – PPO | Admitting: Family Medicine

## 2014-02-27 ENCOUNTER — Encounter: Payer: Self-pay | Admitting: Family Medicine

## 2014-02-27 VITALS — BP 166/99 | HR 85 | Temp 98.1°F | Ht 73.0 in | Wt >= 6400 oz

## 2014-02-27 DIAGNOSIS — E119 Type 2 diabetes mellitus without complications: Secondary | ICD-10-CM

## 2014-02-27 DIAGNOSIS — M25562 Pain in left knee: Secondary | ICD-10-CM

## 2014-02-27 DIAGNOSIS — N529 Male erectile dysfunction, unspecified: Secondary | ICD-10-CM

## 2014-02-27 DIAGNOSIS — E1169 Type 2 diabetes mellitus with other specified complication: Secondary | ICD-10-CM

## 2014-02-27 DIAGNOSIS — M25561 Pain in right knee: Secondary | ICD-10-CM

## 2014-02-27 DIAGNOSIS — I10 Essential (primary) hypertension: Secondary | ICD-10-CM

## 2014-02-27 DIAGNOSIS — E669 Obesity, unspecified: Secondary | ICD-10-CM

## 2014-02-27 DIAGNOSIS — E785 Hyperlipidemia, unspecified: Secondary | ICD-10-CM

## 2014-02-27 MED ORDER — LISINOPRIL-HYDROCHLOROTHIAZIDE 10-12.5 MG PO TABS: 1.0000 | ORAL_TABLET | Freq: Two times a day (BID) | ORAL | Status: DC

## 2014-02-27 NOTE — Progress Notes (Signed)
Jeffrey Molina  [id number] [birth year] 02/27/2014      Progress Note-Follow Up  Subjective  Chief Complaint  Chief Complaint  Patient presents with  . Follow-up    1 month    HPI  Patient is a 53 y.o. male in today for routine medical care. Patient is in today for follow-up evaluation. Went to class IV possible gastric bypass and is still interested in proceeding. Acknowledges he has not been eating as well as he should have. More simple carbs than instructed. Denies recent illness but does note some right ankle pain and left calf pain intermittently. Denies CP/palp/SOB/HA/congestion/fevers/GI or GU c/o. Taking meds as prescribed  Past Medical History  Diagnosis Date  . Hypertension   . Chicken pox as a child  . Low testosterone 10/28/2013  . Cutaneous skin tags 10/28/2013  . Anemia 10/28/2013  . Peripheral artery disease 11/03/2013  . Diabetes mellitus type 2 in obese 01/27/2014    History reviewed. No pertinent past surgical history.  Family History  Problem Relation Age of Onset  . Fibromyalgia Mother   . Cancer Father     skin cancer  . Cataracts Father   . Arthritis Father   . Diabetes Maternal Grandfather     History   Social History  . Marital Status: Married    Spouse Name: N/A    Number of Children: N/A  . Years of Education: N/A   Occupational History  . Manager [employer]   Social History Main Topics  . Smoking status: Never Smoker   . Smokeless tobacco: Not on file  . Alcohol Use: No     Comment: very rare. special occasions  . Drug Use: No  . Sexual Activity: Yes     Comment: lives with wife and children, works for [employer]   Other Topics Concern  . Not on file   Social History Narrative    Current Outpatient Prescriptions on File Prior to Visit  Medication Sig Dispense Refill  . lisinopril (PRINIVIL,ZESTRIL) 10 MG tablet Take 1 tablet (10 mg total) by mouth daily. 30 tablet 3  . sildenafil (REVATIO) 20 MG  tablet Take 1 tablet (20 mg total) by mouth as directed. 1-4 tablet po daily prn for ED 50 tablet 0   No current facility-administered medications on file prior to visit.    No Known Allergies  Review of Systems  Review of Systems  Constitutional: Negative for fever and malaise/fatigue.  HENT: Negative for congestion.   Eyes: Negative for discharge.  Respiratory: Negative for shortness of breath.   Cardiovascular: Negative for chest pain, palpitations and leg swelling.  Gastrointestinal: Negative for nausea, abdominal pain and diarrhea.  Genitourinary: Negative for dysuria.  Musculoskeletal: Positive for joint pain. Negative for falls.  Skin: Negative for rash.  Neurological: Negative for loss of consciousness and headaches.  Endo/Heme/Allergies: Negative for polydipsia.  Psychiatric/Behavioral: Negative for depression and suicidal ideas. The patient is not nervous/anxious and does not have insomnia.     Objective  BP 166/99 mmHg  Pulse 85  Temp(Src) 98.1 F (36.7 C) (Oral)  Ht 6\' 1"  (1.854 m)  Wt 422 lb (191.418 kg)  BMI 55.69 kg/m2  SpO2 94%  Physical Exam  Physical Exam  Constitutional: He is oriented to person, place, and time and well-developed, well-nourished, and in no distress. No distress.  obese  HENT:  Head: Normocephalic and atraumatic.  Eyes: Conjunctivae are normal.  Neck: Neck supple. No thyromegaly present.  Cardiovascular: Normal rate,  regular rhythm and normal heart sounds.   No murmur heard. Pulmonary/Chest: Effort normal and breath sounds normal. No respiratory distress.  Abdominal: He exhibits no distension and no mass. There is no tenderness.  Musculoskeletal: He exhibits no edema.  Neurological: He is alert and oriented to person, place, and time.  Skin: Skin is warm.  Psychiatric: Memory, affect and judgment normal.    Lab Results  Component Value Date   TSH 2.12 12/27/2013   Lab Results  Component Value Date   WBC 6.9 12/27/2013    HGB 14.6 12/27/2013   HCT 44.7 12/27/2013   MCV 90.2 12/27/2013   PLT 316.0 12/27/2013   Lab Results  Component Value Date   CREATININE 1.1 12/27/2013   BUN 17 12/27/2013   NA 136 12/27/2013   K 4.0 12/27/2013   CL 105 12/27/2013   CO2 24 12/27/2013   Lab Results  Component Value Date   ALT 31 12/27/2013   AST 21 12/27/2013   ALKPHOS 41 12/27/2013   BILITOT 0.6 12/27/2013   Lab Results  Component Value Date   CHOL 182 12/27/2013   Lab Results  Component Value Date   HDL 27.20* 12/27/2013   Lab Results  Component Value Date   LDLCALC 120* 12/27/2013   Lab Results  Component Value Date   TRIG 175.0* 12/27/2013   Lab Results  Component Value Date   CHOLHDL 7 12/27/2013     Assessment & Plan  HTN (hypertension) Poorly controlled will alter medications, encouraged DASH diet, minimize caffeine and obtain adequate sleep. Report concerning symptoms and follow up as directed and as needed  Diabetes mellitus type 2 in obese hgba1c acceptable, minimize simple carbs. Increase exercise as tolerated. Continue current meds  Erectile dysfunction Given rx for Revatio. Warned regarding possible side effects.  Morbid obesity Encouraged DASH diet, decrease po intake and increase exercise as tolerated. Needs 7-8 hours of sleep nightly. Avoid trans fats, eat small, frequent meals every 4-5 hours with lean proteins, complex carbs and healthy fats. Minimize simple carbs. Has gone to class for possible gastric bypass surgery.  Hyperlipidemia Encouraged heart healthy diet, increase exercise, avoid trans fats, consider a krill oil cap daily  Knee pain, bilateral C/0 cent right ankle pain and left calf pain, no trauma. Encouraged activity as tolerated and Salon Pas gel prn

## 2014-02-27 NOTE — Patient Instructions (Signed)

## 2014-02-27 NOTE — Progress Notes (Signed)
Pre visit review using our clinic review tool, if applicable. No additional management support is needed unless otherwise documented below in the visit note. 

## 2014-03-09 ENCOUNTER — Encounter: Payer: Self-pay | Admitting: Family Medicine

## 2014-03-09 NOTE — Assessment & Plan Note (Signed)
hgba1c acceptable, minimize simple carbs. Increase exercise as tolerated. Continue current meds 

## 2014-03-09 NOTE — Assessment & Plan Note (Signed)
Poorly controlled will alter medications, encouraged DASH diet, minimize caffeine and obtain adequate sleep. Report concerning symptoms and follow up as directed and as needed 

## 2014-03-09 NOTE — Assessment & Plan Note (Signed)
Given rx for Revatio. Warned regarding possible side effects.

## 2014-03-09 NOTE — Assessment & Plan Note (Signed)
Encouraged DASH diet, decrease po intake and increase exercise as tolerated. Needs 7-8 hours of sleep nightly. Avoid trans fats, eat small, frequent meals every 4-5 hours with lean proteins, complex carbs and healthy fats. Minimize simple carbs. Has gone to class for possible gastric bypass surgery.

## 2014-03-09 NOTE — Assessment & Plan Note (Signed)
C/0 cent right ankle pain and left calf pain, no trauma. Encouraged activity as tolerated and Salon Pas gel prn

## 2014-03-09 NOTE — Assessment & Plan Note (Signed)
Encouraged heart healthy diet, increase exercise, avoid trans fats, consider a krill oil cap daily 

## 2014-03-25 ENCOUNTER — Ambulatory Visit (INDEPENDENT_AMBULATORY_CARE_PROVIDER_SITE_OTHER): Payer: BLUE CROSS/BLUE SHIELD | Admitting: Family Medicine

## 2014-03-25 ENCOUNTER — Encounter: Payer: Self-pay | Admitting: Family Medicine

## 2014-03-25 VITALS — BP 144/84 | HR 86 | Temp 98.0°F | Ht 73.0 in | Wt >= 6400 oz

## 2014-03-25 DIAGNOSIS — R05 Cough: Secondary | ICD-10-CM

## 2014-03-25 DIAGNOSIS — R059 Cough, unspecified: Secondary | ICD-10-CM

## 2014-03-25 DIAGNOSIS — E119 Type 2 diabetes mellitus without complications: Secondary | ICD-10-CM

## 2014-03-25 DIAGNOSIS — E669 Obesity, unspecified: Secondary | ICD-10-CM

## 2014-03-25 DIAGNOSIS — I10 Essential (primary) hypertension: Secondary | ICD-10-CM

## 2014-03-25 DIAGNOSIS — E1169 Type 2 diabetes mellitus with other specified complication: Secondary | ICD-10-CM

## 2014-03-25 DIAGNOSIS — E785 Hyperlipidemia, unspecified: Secondary | ICD-10-CM

## 2014-03-25 DIAGNOSIS — K219 Gastro-esophageal reflux disease without esophagitis: Secondary | ICD-10-CM

## 2014-03-25 MED ORDER — RANITIDINE HCL 300 MG PO TABS: 300.0000 mg | ORAL_TABLET | Freq: Every day | ORAL | Status: DC

## 2014-03-25 MED ORDER — LISINOPRIL-HYDROCHLOROTHIAZIDE 20-12.5 MG PO TABS: 1.0000 | ORAL_TABLET | Freq: Two times a day (BID) | ORAL | Status: DC

## 2014-03-25 NOTE — Patient Instructions (Addendum)
Start a probiotic daily such as Digestive Advantage or [employer]'s [employer] Health Start Zantac   Gastroesophageal Reflux Disease, Adult Gastroesophageal reflux disease (GERD) happens when acid from your stomach flows up into the esophagus. When acid comes in contact with the esophagus, the acid causes soreness (inflammation) in the esophagus. Over time, GERD may create small holes (ulcers) in the lining of the esophagus. CAUSES   Increased body weight. This puts pressure on the stomach, making acid rise from the stomach into the esophagus.  Smoking. This increases acid production in the stomach.  Drinking alcohol. This causes decreased pressure in the lower esophageal sphincter (valve or ring of muscle between the esophagus and stomach), allowing acid from the stomach into the esophagus.  Late evening meals and a full stomach. This increases pressure and acid production in the stomach.  A malformed lower esophageal sphincter. Sometimes, no cause is found. SYMPTOMS   Burning pain in the lower part of the mid-chest behind the breastbone and in the mid-stomach area. This may occur twice a week or more often.  Trouble swallowing.  Sore throat.  Dry cough.  Asthma-like symptoms including chest tightness, shortness of breath, or wheezing. DIAGNOSIS  Your caregiver may be able to diagnose GERD based on your symptoms. In some cases, X-rays and other tests may be done to check for complications or to check the condition of your stomach and esophagus. TREATMENT  Your caregiver may recommend over-the-counter or prescription medicines to help decrease acid production. Ask your caregiver before starting or adding any new medicines.  HOME CARE INSTRUCTIONS   Change the factors that you can control. Ask your caregiver for guidance concerning weight loss, quitting smoking, and alcohol consumption.  Avoid foods and drinks that make your symptoms worse, such as:  Caffeine or alcoholic  drinks.  Chocolate.  Peppermint or mint flavorings.  Garlic and onions.  Spicy foods.  Citrus fruits, such as oranges, lemons, or limes.  Tomato-based foods such as sauce, chili, salsa, and pizza.  Fried and fatty foods.  Avoid lying down for the 3 hours prior to your bedtime or prior to taking a nap.  Eat small, frequent meals instead of large meals.  Wear loose-fitting clothing. Do not wear anything tight around your waist that causes pressure on your stomach.  Raise the head of your bed 6 to 8 inches with wood blocks to help you sleep. Extra pillows will not help.  Only take over-the-counter or prescription medicines for pain, discomfort, or fever as directed by your caregiver.  Do not take aspirin, ibuprofen, or other nonsteroidal anti-inflammatory drugs (NSAIDs). SEEK IMMEDIATE MEDICAL CARE IF:   You have pain in your arms, neck, jaw, teeth, or back.  Your pain increases or changes in intensity or duration.  You develop nausea, vomiting, or sweating (diaphoresis).  You develop shortness of breath, or you faint.  Your vomit is green, yellow, black, or looks like coffee grounds or blood.  Your stool is red, bloody, or black. These symptoms could be signs of other problems, such as heart disease, gastric bleeding, or esophageal bleeding. MAKE SURE YOU:   Understand these instructions.  Will watch your condition.  Will get help right away if you are not doing well or get worse. Document Released: 12/15/2004 Document Revised: 05/30/2011 Document Reviewed: 09/24/2010 [facility] Patient Information 2015 [place], [place]. This information is not intended to replace advice given to you by your health care provider. Make sure you discuss any questions you have with your health care provider.  DASH Eating Plan DASH stands for "Dietary Approaches to Stop Hypertension." The DASH eating plan is a healthy eating plan that has been shown to reduce high blood pressure  (hypertension). Additional health benefits may include reducing the risk of type 2 diabetes mellitus, heart disease, and stroke. The DASH eating plan may also help with weight loss. WHAT DO I NEED TO KNOW ABOUT THE DASH EATING PLAN? For the DASH eating plan, you will follow these general guidelines:  Choose foods with a percent daily value for sodium of less than 5% (as listed on the food label).  Use salt-free seasonings or herbs instead of table salt or sea salt.  Check with your health care provider or pharmacist before using salt substitutes.  Eat lower-sodium products, often labeled as "lower sodium" or "no salt added."  Eat fresh foods.  Eat more vegetables, fruits, and low-fat dairy products.  Choose whole grains. Look for the word "whole" as the first word in the ingredient list.  Choose fish and skinless chicken or [place] more often than red meat. Limit fish, poultry, and meat to 6 oz (170 g) each day.  Limit sweets, desserts, sugars, and sugary drinks.  Choose heart-healthy fats.  Limit cheese to 1 oz (28 g) per day.  Eat more home-cooked food and less restaurant, buffet, and fast food.  Limit fried foods.  Cook foods using methods other than frying.  Limit canned vegetables. If you do use them, rinse them well to decrease the sodium.  When eating at a restaurant, ask that your food be prepared with less salt, or no salt if possible. WHAT FOODS CAN I EAT? Seek help from a dietitian for individual calorie needs. Grains Whole grain or whole wheat bread. Brown rice. Whole grain or whole wheat pasta. Quinoa, bulgur, and whole grain cereals. Low-sodium cereals. Corn or whole wheat flour tortillas. Whole grain cornbread. Whole grain crackers. Low-sodium crackers. Vegetables Fresh or frozen vegetables (raw, steamed, roasted, or grilled). Low-sodium or reduced-sodium tomato and vegetable juices. Low-sodium or reduced-sodium tomato sauce and paste. Low-sodium or  reduced-sodium canned vegetables.  Fruits All fresh, canned (in natural juice), or frozen fruits. Meat and Other Protein Products Ground beef (85% or leaner), grass-fed beef, or beef trimmed of fat. Skinless chicken or [place]. Ground chicken or [place]key. Pork trimmed of fat. All fish and seafood. Eggs. Dried beans, peas, or lentils. Unsalted nuts and seeds. Unsalted canned beans. Dairy Low-fat dairy products, such as skim or 1% milk, 2% or reduced-fat cheeses, low-fat ricotta or cottage cheese, or plain low-fat yogurt. Low-sodium or reduced-sodium cheeses. Fats and Oils Tub margarines without trans fats. Light or reduced-fat mayonnaise and salad dressings (reduced sodium). Avocado. Safflower, olive, or canola oils. Natural peanut or almond butter. Other Unsalted popcorn and pretzels. The items listed above may not be a complete list of recommended foods or beverages. Contact your dietitian for more options. WHAT FOODS ARE NOT RECOMMENDED? Grains White bread. White pasta. White rice. Refined cornbread. Bagels and croissants. Crackers that contain trans fat. Vegetables Creamed or fried vegetables. Vegetables in a cheese sauce. Regular canned vegetables. Regular canned tomato sauce and paste. Regular tomato and vegetable juices. Fruits Dried fruits. Canned fruit in light or heavy syrup. Fruit juice. Meat and Other Protein Products Fatty cuts of meat. Ribs, chicken wings, bacon, sausage, bologna, salami, chitterlings, fatback, hot dogs, bratwurst, and packaged luncheon meats. Salted nuts and seeds. Canned beans with salt. Dairy Whole or 2% milk, cream, half-and-half, and cream cheese. Whole-fat or sweetened yogurt.  Full-fat cheeses or blue cheese. Nondairy creamers and whipped toppings. Processed cheese, cheese spreads, or cheese curds. Condiments Onion and garlic salt, seasoned salt, table salt, and sea salt. Canned and packaged gravies. Worcestershire sauce. Tartar sauce. Barbecue sauce. Teriyaki  sauce. Soy sauce, including reduced sodium. Steak sauce. Fish sauce. Oyster sauce. Cocktail sauce. Horseradish. Ketchup and mustard. Meat flavorings and tenderizers. Bouillon cubes. Hot sauce. Tabasco sauce. Marinades. Taco seasonings. Relishes. Fats and Oils Butter, stick margarine, lard, shortening, ghee, and bacon fat. Coconut, palm kernel, or palm oils. Regular salad dressings. Other Pickles and olives. Salted popcorn and pretzels. The items listed above may not be a complete list of foods and beverages to avoid. Contact your dietitian for more information. WHERE CAN I FIND MORE INFORMATION? National Heart, Lung, and Blood Institute: [employer] Document Released: 02/24/2011 Document Revised: 07/22/2013 Document Reviewed: 01/09/2013 [facility] Patient Information 2015 [place], [place]. This information is not intended to replace advice given to you by your health care provider. Make sure you discuss any questions you have with your health care provider.

## 2014-03-25 NOTE — Progress Notes (Signed)
Pre visit review using our clinic review tool, if applicable. No additional management support is needed unless otherwise documented below in the visit note. 

## 2014-03-27 ENCOUNTER — Other Ambulatory Visit (INDEPENDENT_AMBULATORY_CARE_PROVIDER_SITE_OTHER): Payer: Self-pay | Admitting: General Surgery

## 2014-03-27 DIAGNOSIS — Z6841 Body Mass Index (BMI) 40.0 and over, adult: Secondary | ICD-10-CM

## 2014-03-27 DIAGNOSIS — K219 Gastro-esophageal reflux disease without esophagitis: Secondary | ICD-10-CM

## 2014-03-30 ENCOUNTER — Encounter: Payer: Self-pay | Admitting: Family Medicine

## 2014-03-30 NOTE — Assessment & Plan Note (Signed)
Encouraged DASH diet, decrease po intake and increase exercise as tolerated. Needs 7-8 hours of sleep nightly. Avoid trans fats, eat small, frequent meals every 4-5 hours with lean proteins, complex carbs and healthy fats. Minimize simple carbs, GMO foods. 

## 2014-03-30 NOTE — Assessment & Plan Note (Signed)
hgba1c acceptable, minimize simple carbs. Increase exercise as tolerated. Continue current meds 

## 2014-03-30 NOTE — Progress Notes (Signed)
Jeffrey Molina  [id number] 07/17/1960 03/30/2014      Progress Note-Follow Up  Subjective  Chief Complaint  Chief Complaint  Patient presents with  . Follow-up    4 weeks    HPI  Patient is a 54 y.o. male in today for routine medical care. Patient is in today for follow-up. He is feeling fairly well but acknowledges he was not able to follow a diabetic low-calorie diet over the holidays. No significant exercise largely due to knee pain and deconditioning. Denies CP/palp/SOB/HA/congestion/fevers/GI or GU c/o. Taking meds as prescribed  Past Medical History  Diagnosis Date  . Hypertension   . Chicken pox as a child  . Low testosterone 10/28/2013  . Cutaneous skin tags 10/28/2013  . Anemia 10/28/2013  . Peripheral artery disease 11/03/2013  . Diabetes mellitus type 2 in obese 01/27/2014    History reviewed. No pertinent past surgical history.  Family History  Problem Relation Age of Onset  . Fibromyalgia Mother   . Cancer Father     skin cancer  . Cataracts Father   . Arthritis Father   . Diabetes Maternal Grandfather     History   Social History  . Marital Status: Married    Spouse Name: N/A    Number of Children: N/A  . Years of Education: N/A   Occupational History  . Manager [employer]   Social History Main Topics  . Smoking status: Never Smoker   . Smokeless tobacco: Not on file  . Alcohol Use: No     Comment: very rare. special occasions  . Drug Use: No  . Sexual Activity: Yes     Comment: lives with wife and children, works for [employer]   Other Topics Concern  . Not on file   Social History Narrative    Current Outpatient Prescriptions on File Prior to Visit  Medication Sig Dispense Refill  . sildenafil (REVATIO) 20 MG tablet Take 1 tablet (20 mg total) by mouth as directed. 1-4 tablet po daily prn for ED 50 tablet 0   No current facility-administered medications on file prior to visit.    No Known  Allergies  Review of Systems  Review of Systems  Constitutional: Negative for fever and malaise/fatigue.  HENT: Negative for congestion.   Eyes: Negative for discharge.  Respiratory: Negative for shortness of breath.   Cardiovascular: Negative for chest pain, palpitations and leg swelling.  Gastrointestinal: Negative for nausea, abdominal pain and diarrhea.  Genitourinary: Negative for dysuria.  Musculoskeletal: Positive for joint pain. Negative for falls.  Skin: Negative for rash.  Neurological: Negative for loss of consciousness and headaches.  Endo/Heme/Allergies: Negative for polydipsia.  Psychiatric/Behavioral: Negative for depression and suicidal ideas. The patient is not nervous/anxious and does not have insomnia.     Objective  BP 144/84 mmHg  Pulse 86  Temp(Src) 98 F (36.7 C) (Oral)  Ht  (1.854 m)  Wt 426 lb 3.2 oz (193.323 kg)  BMI 56.24 kg/m2  SpO2 96%  Physical Exam  Physical Exam  Constitutional: He is oriented to person, place, and time and well-developed, well-nourished, and in no distress. No distress.  Morbid obesity  HENT:  Head: Normocephalic and atraumatic.  Eyes: Conjunctivae are normal.  Neck: Neck supple. No thyromegaly present.  Cardiovascular: Normal rate, regular rhythm and normal heart sounds.   No murmur heard. Pulmonary/Chest: Effort normal and breath sounds normal. No respiratory distress.  Abdominal: He exhibits no distension and no mass. There is  no tenderness.  Musculoskeletal: He exhibits no edema.  Neurological: He is alert and oriented to person, place, and time.  Skin: Skin is warm.  Psychiatric: Memory, affect and judgment normal.    Lab Results  Component Value Date   TSH 2.12 12/27/2013   Lab Results  Component Value Date   WBC 6.9 12/27/2013   HGB 14.6 12/27/2013   HCT 44.7 12/27/2013   MCV 90.2 12/27/2013   PLT 316.0 12/27/2013   Lab Results  Component Value Date   CREATININE 1.1 12/27/2013   BUN 17  12/27/2013   NA 136 12/27/2013   K 4.0 12/27/2013   CL 105 12/27/2013   CO2 24 12/27/2013   Lab Results  Component Value Date   ALT 31 12/27/2013   AST 21 12/27/2013   ALKPHOS 41 12/27/2013   BILITOT 0.6 12/27/2013   Lab Results  Component Value Date   CHOL 182 12/27/2013   Lab Results  Component Value Date   HDL 27.20* 12/27/2013   Lab Results  Component Value Date   LDLCALC 120* 12/27/2013   Lab Results  Component Value Date   TRIG 175.0* 12/27/2013   Lab Results  Component Value Date   CHOLHDL 7 12/27/2013     Assessment & Plan  HTN (hypertension) Poorly controlled will alter medications, encouraged DASH diet, minimize caffeine and obtain adequate sleep. Report concerning symptoms and follow up as directed and as needed. Increase lisinopril hct to bid  Diabetes mellitus type 2 in obese hgba1c acceptable, minimize simple carbs. Increase exercise as tolerated. Continue current meds  Morbid obesity Encouraged DASH diet, decrease po intake and increase exercise as tolerated. Needs 7-8 hours of sleep nightly. Avoid trans fats, eat small, frequent meals every 4-5 hours with lean proteins, complex carbs and healthy fats. Minimize simple carbs, GMO foods.  Hyperlipidemia Encouraged heart healthy diet, increase exercise, avoid trans fats, consider a krill oil cap daily

## 2014-03-30 NOTE — Assessment & Plan Note (Signed)
Poorly controlled will alter medications, encouraged DASH diet, minimize caffeine and obtain adequate sleep. Report concerning symptoms and follow up as directed and as needed. Increase lisinopril hct to bid

## 2014-03-30 NOTE — Assessment & Plan Note (Signed)
Encouraged heart healthy diet, increase exercise, avoid trans fats, consider a krill oil cap daily 

## 2014-04-02 ENCOUNTER — Other Ambulatory Visit: Payer: Self-pay | Admitting: General Surgery

## 2014-04-03 LAB — IRON AND TIBC
%SAT: 25 % (ref 20–55)
IRON: 72 ug/dL (ref 42–165)
TIBC: 290 ug/dL (ref 215–435)
UIBC: 218 ug/dL (ref 125–400)

## 2014-04-03 LAB — CBC WITH DIFFERENTIAL/PLATELET
BASOS PCT: 1 % (ref 0–1)
Basophils Absolute: 0.1 10*3/uL (ref 0.0–0.1)
Eosinophils Absolute: 0.2 10*3/uL (ref 0.0–0.7)
Eosinophils Relative: 3 % (ref 0–5)
HEMATOCRIT: 43.7 % (ref 39.0–52.0)
Hemoglobin: 14.5 g/dL (ref 13.0–17.0)
LYMPHS PCT: 23 % (ref 12–46)
Lymphs Abs: 1.6 10*3/uL (ref 0.7–4.0)
MCH: 29.7 pg (ref 26.0–34.0)
MCHC: 33.2 g/dL (ref 30.0–36.0)
MCV: 89.4 fL (ref 78.0–100.0)
MONO ABS: 0.6 10*3/uL (ref 0.1–1.0)
MONOS PCT: 9 % (ref 3–12)
MPV: 8.6 fL (ref 8.6–12.4)
Neutro Abs: 4.5 10*3/uL (ref 1.7–7.7)
Neutrophils Relative %: 64 % (ref 43–77)
Platelets: 309 10*3/uL (ref 150–400)
RBC: 4.89 MIL/uL (ref 4.22–5.81)
RDW: 15 % (ref 11.5–15.5)
WBC: 7.1 10*3/uL (ref 4.0–10.5)

## 2014-04-03 LAB — FOLATE: FOLATE: 11.7 ng/mL

## 2014-04-03 LAB — LIPID PANEL
Cholesterol: 188 mg/dL (ref 0–200)
HDL: 32 mg/dL — ABNORMAL LOW (ref >39)
LDL CALC: 127 mg/dL — AB (ref 0–99)
Total CHOL/HDL Ratio: 5.9 Ratio
Triglycerides: 145 mg/dL (ref <150)
VLDL: 29 mg/dL (ref 0–40)

## 2014-04-03 LAB — COMPREHENSIVE METABOLIC PANEL
ALT: 24 U/L (ref 0–53)
AST: 14 U/L (ref 0–37)
Albumin: 3.9 g/dL (ref 3.5–5.2)
Alkaline Phosphatase: 39 U/L (ref 39–117)
BUN: 18 mg/dL (ref 6–23)
CHLORIDE: 98 meq/L (ref 96–112)
CO2: 27 mEq/L (ref 19–32)
Calcium: 9.7 mg/dL (ref 8.4–10.5)
Creat: 1.06 mg/dL (ref 0.50–1.35)
GLUCOSE: 125 mg/dL — AB (ref 70–99)
Potassium: 4.5 mEq/L (ref 3.5–5.3)
Sodium: 137 mEq/L (ref 135–145)
Total Bilirubin: 0.5 mg/dL (ref 0.2–1.2)
Total Protein: 6.6 g/dL (ref 6.0–8.3)

## 2014-04-03 LAB — VITAMIN B12: Vitamin B-12: 413 pg/mL (ref 211–911)

## 2014-04-03 LAB — T4, FREE: FREE T4: 1.09 ng/dL (ref 0.80–1.80)

## 2014-04-03 LAB — TSH: TSH: 4.181 u[IU]/mL (ref 0.350–4.500)

## 2014-04-03 LAB — FERRITIN: FERRITIN: 180 ng/mL (ref 22–322)

## 2014-04-03 LAB — H. PYLORI BREATH TEST: H. pylori Breath Test: NOT DETECTED

## 2014-04-05 LAB — GLYCOHEMOGLOBIN, TOTAL: Glycohemoglobin (GHb),Total: 7.8 % — ABNORMAL HIGH (ref 5.4–7.4)

## 2014-04-06 LAB — VITAMIN D 1,25 DIHYDROXY
Vitamin D 1, 25 (OH)2 Total: 31 pg/mL (ref 18–72)
Vitamin D2 1, 25 (OH)2: <8 pg/mL
Vitamin D3 1, 25 (OH)2: 31 pg/mL

## 2014-04-21 ENCOUNTER — Ambulatory Visit (HOSPITAL_COMMUNITY)
Admission: RE | Admit: 2014-04-21 | Discharge: 2014-04-21 | Disposition: A | Discharge status: Home / Self Care | Payer: BLUE CROSS/BLUE SHIELD | Source: Ambulatory Visit | Attending: General Surgery | Admitting: General Surgery

## 2014-04-21 ENCOUNTER — Other Ambulatory Visit: Payer: Self-pay

## 2014-04-21 DIAGNOSIS — K449 Diaphragmatic hernia without obstruction or gangrene: Secondary | ICD-10-CM | POA: Diagnosis not present

## 2014-04-21 DIAGNOSIS — M179 Osteoarthritis of knee, unspecified: Secondary | ICD-10-CM | POA: Insufficient documentation

## 2014-04-21 DIAGNOSIS — I10 Essential (primary) hypertension: Secondary | ICD-10-CM | POA: Insufficient documentation

## 2014-04-21 DIAGNOSIS — K219 Gastro-esophageal reflux disease without esophagitis: Secondary | ICD-10-CM

## 2014-04-21 DIAGNOSIS — Z6841 Body Mass Index (BMI) 40.0 and over, adult: Principal | ICD-10-CM

## 2014-04-21 DIAGNOSIS — E119 Type 2 diabetes mellitus without complications: Secondary | ICD-10-CM | POA: Diagnosis not present

## 2014-04-24 ENCOUNTER — Encounter: Payer: Self-pay | Admitting: Family Medicine

## 2014-05-01 ENCOUNTER — Ambulatory Visit (INDEPENDENT_AMBULATORY_CARE_PROVIDER_SITE_OTHER): Payer: BLUE CROSS/BLUE SHIELD | Admitting: Family Medicine

## 2014-05-01 ENCOUNTER — Encounter: Payer: Self-pay | Admitting: Family Medicine

## 2014-05-01 VITALS — BP 132/80 | HR 109 | Temp 98.6°F | Ht 73.0 in | Wt >= 6400 oz

## 2014-05-01 DIAGNOSIS — I10 Essential (primary) hypertension: Secondary | ICD-10-CM

## 2014-05-01 DIAGNOSIS — R000 Tachycardia, unspecified: Secondary | ICD-10-CM

## 2014-05-01 DIAGNOSIS — E119 Type 2 diabetes mellitus without complications: Secondary | ICD-10-CM

## 2014-05-01 DIAGNOSIS — E1169 Type 2 diabetes mellitus with other specified complication: Secondary | ICD-10-CM

## 2014-05-01 DIAGNOSIS — E669 Obesity, unspecified: Secondary | ICD-10-CM

## 2014-05-01 NOTE — Patient Instructions (Signed)

## 2014-05-01 NOTE — Progress Notes (Signed)
Pre visit review using our clinic review tool, if applicable. No additional management support is needed unless otherwise documented below in the visit note. 

## 2014-05-01 NOTE — Progress Notes (Signed)
Jeffrey Molina  [id number] 08/28/1960 05/01/2014      Progress Note-Follow Up  Subjective  Chief Complaint  Chief Complaint  Patient presents with  . Follow-up    HPI  Patient is a 54 y.o. male in today for routine medical care. Doing well. Has proceeded with orientation for gastric bypass surgery and reports he would like to proceed. He has decreased his caloric intake but not made significant dietary changes otherwise. He is not participating in targeted exercise at this time but does work full-time and is active. Denies CP/palp/SOB/HA/congestion/fevers/GI or GU c/o. Taking meds as prescribed Past Medical History  Diagnosis Date  . Hypertension   . Chicken pox as a child  . Low testosterone 10/28/2013  . Cutaneous skin tags 10/28/2013  . Anemia 10/28/2013  . Peripheral artery disease 11/03/2013  . Diabetes mellitus type 2 in obese 01/27/2014    History reviewed. No pertinent past surgical history.  Family History  Problem Relation Age of Onset  . Fibromyalgia Mother   . Cancer Father     skin cancer  . Cataracts Father   . Arthritis Father   . Diabetes Maternal Grandfather     History   Social History  . Marital Status: Married    Spouse Name: N/A  . Number of Children: N/A  . Years of Education: N/A   Occupational History  . Manager [employer]   Social History Main Topics  . Smoking status: Never Smoker   . Smokeless tobacco: Not on file  . Alcohol Use: No     Comment: very rare. special occasions  . Drug Use: No  . Sexual Activity: Yes     Comment: lives with wife and children, works for [employer]   Other Topics Concern  . Not on file   Social History Narrative    Current Outpatient Prescriptions on File Prior to Visit  Medication Sig Dispense Refill  . lisinopril-hydrochlorothiazide (PRINZIDE,ZESTORETIC) 20-12.5 MG per tablet Take 1 tablet by mouth 2 (two) times daily. 60 tablet 3  . ranitidine (ZANTAC) 300 MG tablet Take  1 tablet (300 mg total) by mouth at bedtime. 30 tablet 2  . sildenafil (REVATIO) 20 MG tablet Take 1 tablet (20 mg total) by mouth as directed. 1-4 tablet po daily prn for ED 50 tablet 0   No current facility-administered medications on file prior to visit.    No Known Allergies  Review of Systems  Review of Systems  Constitutional: Negative for fever and malaise/fatigue.  HENT: Negative for congestion.   Eyes: Negative for discharge.  Respiratory: Negative for shortness of breath.   Cardiovascular: Negative for chest pain, palpitations and leg swelling.  Gastrointestinal: Negative for nausea, abdominal pain and diarrhea.  Genitourinary: Negative for dysuria.  Musculoskeletal: Negative for falls.  Skin: Negative for rash.  Neurological: Negative for loss of consciousness and headaches.  Endo/Heme/Allergies: Negative for polydipsia.  Psychiatric/Behavioral: Negative for depression and suicidal ideas. The patient is not nervous/anxious and does not have insomnia.     Objective  BP 132/80 mmHg  Pulse 109  Temp(Src) 98.6 F (37 C) (Oral)  Ht  (1.854 m)  Wt 422 lb 8 oz (191.645 kg)  BMI 55.75 kg/m2  SpO2 95%  Physical Exam  Physical Exam  Lab Results  Component Value Date   TSH 4.181 04/02/2014   Lab Results  Component Value Date   WBC 7.1 04/02/2014   HGB 14.5 04/02/2014   HCT 43.7 04/02/2014  MCV 89.4 04/02/2014   PLT 309 04/02/2014   Lab Results  Component Value Date   CREATININE 1.06 04/02/2014   BUN 18 04/02/2014   NA 137 04/02/2014   K 4.5 04/02/2014   CL 98 04/02/2014   CO2 27 04/02/2014   Lab Results  Component Value Date   ALT 24 04/02/2014   AST 14 04/02/2014   ALKPHOS 39 04/02/2014   BILITOT 0.5 04/02/2014   Lab Results  Component Value Date   CHOL 188 04/02/2014   Lab Results  Component Value Date   HDL 32* 04/02/2014   Lab Results  Component Value Date   LDLCALC 127* 04/02/2014   Lab Results  Component Value Date   TRIG  145 04/02/2014   Lab Results  Component Value Date   CHOLHDL 5.9 04/02/2014     Assessment & Plan  HTN (hypertension) Well controlled, no changes to meds. Encouraged heart healthy diet such as the DASH diet and exercise as tolerated.    Diabetes mellitus type 2 in obese hgba1c acceptable, minimize simple carbs. Increase exercise as tolerated. Continue current meds   Morbid obesity Encouraged DASH diet, decrease po intake and increase exercise as tolerated. Needs 7-8 hours of sleep nightly. Avoid trans fats, eat small, frequent meals every 4-5 hours with lean proteins, complex carbs and healthy fats. Minimize simple carbs   Tachycardia Started on Metoprolol XL 25 mg daily

## 2014-05-07 ENCOUNTER — Encounter: Payer: Self-pay | Admitting: Family Medicine

## 2014-05-09 ENCOUNTER — Other Ambulatory Visit: Payer: Self-pay | Admitting: Family Medicine

## 2014-05-09 MED ORDER — METOPROLOL SUCCINATE ER 25 MG PO TB24: 25.0000 mg | ORAL_TABLET | Freq: Every day | ORAL | Status: DC

## 2014-05-09 NOTE — Telephone Encounter (Signed)
The patient would like to know if he needs to go back on BP meds.  He was taken off lisinopril due to lip swelling.  He was given prednisone, cetirizine and  Ranitidine at the office in [place] where he was seen for the reaction to the BP medication.  They did not give him any new BP medication.  Call back on cell number [phone].

## 2014-05-12 ENCOUNTER — Encounter: Payer: Self-pay | Admitting: Family Medicine

## 2014-05-15 ENCOUNTER — Encounter: Payer: Self-pay | Admitting: Family Medicine

## 2014-05-15 DIAGNOSIS — R000 Tachycardia, unspecified: Secondary | ICD-10-CM

## 2014-05-15 HISTORY — DX: Tachycardia, unspecified: R00.0

## 2014-05-15 NOTE — Assessment & Plan Note (Signed)
Encouraged DASH diet, decrease po intake and increase exercise as tolerated. Needs 7-8 hours of sleep nightly. Avoid trans fats, eat small, frequent meals every 4-5 hours with lean proteins, complex carbs and healthy fats. Minimize simple carbs 

## 2014-05-15 NOTE — Assessment & Plan Note (Signed)
Started on Metoprolol XL 25 mg daily 

## 2014-05-15 NOTE — Assessment & Plan Note (Signed)
hgba1c acceptable, minimize simple carbs. Increase exercise as tolerated. Continue current meds 

## 2014-05-15 NOTE — Assessment & Plan Note (Signed)
Well controlled, no changes to meds. Encouraged heart healthy diet such as the DASH diet and exercise as tolerated.  °

## 2014-05-24 ENCOUNTER — Encounter: Payer: BLUE CROSS/BLUE SHIELD | Attending: General Surgery | Admitting: Dietician

## 2014-05-24 ENCOUNTER — Encounter: Payer: Self-pay | Admitting: Dietician

## 2014-05-24 DIAGNOSIS — Z6841 Body Mass Index (BMI) 40.0 and over, adult: Secondary | ICD-10-CM | POA: Diagnosis not present

## 2014-05-24 DIAGNOSIS — Z713 Dietary counseling and surveillance: Secondary | ICD-10-CM | POA: Insufficient documentation

## 2014-05-24 NOTE — Progress Notes (Signed)
  Pre-Op Assessment Visit:  Pre-Operative RYGB Surgery  Medical Nutrition Therapy:  Appt start time: 0900   End time:  0930.  Patient was seen on 05/24/2014 for Pre-Operative Nutrition Assessment. Assessment [facility] on 05/24/2014.   Preferred Learning Style:   No preference indicated   Learning Readiness:   Ready  Handouts given during visit include:  Pre-Op Goals Bariatric Surgery Protein Shakes   During the appointment today the following Pre-Op Goals were reviewed with the patient: Maintain or lose weight as instructed by your surgeon Make healthy food choices Begin to limit portion sizes Limited concentrated sugars and fried foods Keep fat/sugar in the single digits per serving on   food labels Practice CHEWING your food  (aim for 30 chews per bite or until applesauce consistency) Practice not drinking 15 minutes before, during, and 30 minutes after each meal/snack Avoid all carbonated beverages  Avoid/limit caffeinated beverages  Avoid all sugar-sweetened beverages Consume 3 meals per day; eat every 3-5 hours Make a list of non-food related activities Aim for 64-100 ounces of FLUID daily  Aim for at least 60-80 grams of PROTEIN daily Look for a liquid protein source that contain ?15 g protein and ?5 g carbohydrate  (ex: shakes, drinks, shots)  Patient-Centered Goals: -More active lifestyle (golf, tennis, raquetball, activities with kids) -Ease with ADLs -General health (resolution of sleep apnea, prediabetes, HTN)  Scale of 1-10: confidence (9) /importance (10)  Demonstrated degree of understanding via:  Teach Back  Teaching Method Utilized:  Visual Auditory Hands on  Barriers to learning/adherence to lifestyle change: knee pain  Patient to call the [facility] to enroll in Pre-Op and Post-Op Nutrition Education when surgery date is scheduled.

## 2014-06-24 ENCOUNTER — Other Ambulatory Visit: Payer: Self-pay

## 2014-06-24 MED ORDER — METOPROLOL SUCCINATE ER 25 MG PO TB24: 25.0000 mg | ORAL_TABLET | Freq: Every day | ORAL | Status: DC

## 2014-06-26 ENCOUNTER — Ambulatory Visit: Payer: Self-pay | Admitting: General Surgery

## 2014-06-30 ENCOUNTER — Ambulatory Visit: Payer: BLUE CROSS/BLUE SHIELD

## 2014-06-30 ENCOUNTER — Encounter: Payer: Self-pay | Admitting: Family Medicine

## 2014-07-07 ENCOUNTER — Ambulatory Visit (INDEPENDENT_AMBULATORY_CARE_PROVIDER_SITE_OTHER): Payer: BLUE CROSS/BLUE SHIELD | Admitting: Family Medicine

## 2014-07-07 ENCOUNTER — Encounter: Payer: Self-pay | Admitting: Family Medicine

## 2014-07-07 DIAGNOSIS — E119 Type 2 diabetes mellitus without complications: Secondary | ICD-10-CM | POA: Diagnosis not present

## 2014-07-07 DIAGNOSIS — E669 Obesity, unspecified: Secondary | ICD-10-CM

## 2014-07-07 DIAGNOSIS — M25561 Pain in right knee: Secondary | ICD-10-CM

## 2014-07-07 DIAGNOSIS — D649 Anemia, unspecified: Secondary | ICD-10-CM

## 2014-07-07 DIAGNOSIS — E1169 Type 2 diabetes mellitus with other specified complication: Secondary | ICD-10-CM

## 2014-07-07 DIAGNOSIS — E785 Hyperlipidemia, unspecified: Secondary | ICD-10-CM

## 2014-07-07 DIAGNOSIS — I10 Essential (primary) hypertension: Secondary | ICD-10-CM | POA: Diagnosis not present

## 2014-07-07 DIAGNOSIS — L309 Dermatitis, unspecified: Secondary | ICD-10-CM

## 2014-07-07 DIAGNOSIS — M25562 Pain in left knee: Secondary | ICD-10-CM

## 2014-07-07 MED ORDER — TRIAMCINOLONE ACETONIDE 0.1 % EX CREA: 1.0000 "application " | TOPICAL_CREAM | Freq: Two times a day (BID) | CUTANEOUS | Status: DC | PRN

## 2014-07-07 MED ORDER — IBUPROFEN 400 MG PO TABS: 400.0000 mg | ORAL_TABLET | Freq: Three times a day (TID) | ORAL | Status: DC | PRN

## 2014-07-07 MED ORDER — METOPROLOL SUCCINATE ER 25 MG PO TB24: 25.0000 mg | ORAL_TABLET | Freq: Every day | ORAL | Status: DC

## 2014-07-07 NOTE — Patient Instructions (Addendum)
Dawn dishwashing liquid to wash off oils and Witch Hazel Astringent   Curcumen/Turmeric 1 cap daily  Melatonin 2-10 mg at bed, no blue or green lights  Poison [employer] Poison ivy is a inflammation of the skin (contact dermatitis) caused by touching the allergens on the leaves of the ivy plant following previous exposure to the plant. The rash usually appears 48 hours after exposure. The rash is usually bumps (papules) or blisters (vesicles) in a linear pattern. Depending on your own sensitivity, the rash may simply cause redness and itching, or it may also progress to blisters which may break open. These must be well cared for to prevent secondary bacterial (germ) infection, followed by scarring. Keep any open areas dry, clean, dressed, and covered with an antibacterial ointment if needed. The eyes may also get puffy. The puffiness is worst in the morning and gets better as the day progresses. This dermatitis usually heals without scarring, within 2 to 3 weeks without treatment. HOME CARE INSTRUCTIONS  Thoroughly wash with soap and water as soon as you have been exposed to poison ivy. You have about one half hour to remove the plant resin before it will cause the rash. This washing will destroy the oil or antigen on the skin that is causing, or will cause, the rash. Be sure to wash under your fingernails as any plant resin there will continue to spread the rash. Do not rub skin vigorously when washing affected area. Poison ivy cannot spread if no oil from the plant remains on your body. A rash that has progressed to weeping sores will not spread the rash unless you have not washed thoroughly. It is also important to wash any clothes you have been wearing as these may carry active allergens. The rash will return if you wear the unwashed clothing, even several days later. Avoidance of the plant in the future is the best measure. Poison ivy plant can be recognized by the number of leaves. Generally, poison ivy has  three leaves with flowering branches on a single stem. Diphenhydramine may be purchased over the counter and used as needed for itching. Do not drive with this medication if it makes you drowsy.Ask your caregiver about medication for children. SEEK MEDICAL CARE IF:  Open sores develop.  Redness spreads beyond area of rash.  You notice purulent (pus-like) discharge.  You have increased pain.  Other signs of infection develop (such as fever). Document Released: 03/04/2000 Document Revised: 05/30/2011 Document Reviewed: 08/15/2008 [facility] Patient Information 2015 [employer], [place]. This information is not intended to replace advice given to you by your health care provider. Make sure you discuss any questions you have with your health care provider.

## 2014-07-07 NOTE — Progress Notes (Signed)
Pre visit review using our clinic review tool, if applicable. No additional management support is needed unless otherwise documented below in the visit note. 

## 2014-07-08 LAB — COMPREHENSIVE METABOLIC PANEL
ALBUMIN: 3.9 g/dL (ref 3.5–5.2)
ALT: 24 U/L (ref 0–53)
AST: 17 U/L (ref 0–37)
Alkaline Phosphatase: 41 U/L (ref 39–117)
BUN: 11 mg/dL (ref 6–23)
CALCIUM: 9.2 mg/dL (ref 8.4–10.5)
CO2: 27 mEq/L (ref 19–32)
Chloride: 104 mEq/L (ref 96–112)
Creatinine, Ser: 1.01 mg/dL (ref 0.40–1.50)
GFR: 82.4 mL/min (ref >60.00)
Glucose, Bld: 102 mg/dL — ABNORMAL HIGH (ref 70–99)
Potassium: 4.2 mEq/L (ref 3.5–5.1)
SODIUM: 137 meq/L (ref 135–145)
TOTAL PROTEIN: 6.8 g/dL (ref 6.0–8.3)
Total Bilirubin: 0.4 mg/dL (ref 0.2–1.2)

## 2014-07-08 LAB — CBC
HCT: 41.5 % (ref 39.0–52.0)
Hemoglobin: 14.1 g/dL (ref 13.0–17.0)
MCHC: 33.9 g/dL (ref 30.0–36.0)
MCV: 88.9 fl (ref 78.0–100.0)
PLATELETS: 305 10*3/uL (ref 150.0–400.0)
RBC: 4.68 Mil/uL (ref 4.22–5.81)
RDW: 14 % (ref 11.5–15.5)
WBC: 7.7 10*3/uL (ref 4.0–10.5)

## 2014-07-08 LAB — TSH: TSH: 5.05 u[IU]/mL — AB (ref 0.35–4.50)

## 2014-07-08 LAB — LIPID PANEL
CHOLESTEROL: 172 mg/dL (ref 0–200)
HDL: 29.7 mg/dL — ABNORMAL LOW (ref >39.00)
LDL Cholesterol: 107 mg/dL — ABNORMAL HIGH (ref 0–99)
NonHDL: 142.3
TRIGLYCERIDES: 177 mg/dL — AB (ref 0.0–149.0)
Total CHOL/HDL Ratio: 6
VLDL: 35.4 mg/dL (ref 0.0–40.0)

## 2014-07-08 LAB — HEMOGLOBIN A1C: HEMOGLOBIN A1C: 6.3 % (ref 4.6–6.5)

## 2014-07-09 ENCOUNTER — Other Ambulatory Visit (INDEPENDENT_AMBULATORY_CARE_PROVIDER_SITE_OTHER): Payer: BLUE CROSS/BLUE SHIELD

## 2014-07-09 DIAGNOSIS — R946 Abnormal results of thyroid function studies: Secondary | ICD-10-CM

## 2014-07-09 DIAGNOSIS — R7989 Other specified abnormal findings of blood chemistry: Secondary | ICD-10-CM

## 2014-07-09 LAB — T4, FREE: Free T4: 0.86 ng/dL (ref 0.60–1.60)

## 2014-07-13 DIAGNOSIS — L309 Dermatitis, unspecified: Secondary | ICD-10-CM | POA: Insufficient documentation

## 2014-07-13 NOTE — Assessment & Plan Note (Signed)
Encouraged heart healthy diet, increase exercise, avoid trans fats, consider a krill oil cap daily 

## 2014-07-13 NOTE — Assessment & Plan Note (Signed)
Well controlled, no changes to meds. Encouraged heart healthy diet such as the DASH diet and exercise as tolerated.  °

## 2014-07-13 NOTE — Assessment & Plan Note (Signed)
Encouraged DASH diet, decrease po intake and increase exercise as tolerated. Needs 7-8 hours of sleep nightly. Avoid trans fats, eat small, frequent meals every 4-5 hours with lean proteins, complex carbs and healthy fats. Minimize simple carbs. Has undergone evaluation for gastric bypass he is anxious to proceed

## 2014-07-13 NOTE — Progress Notes (Signed)
Jeffrey Molina  [id number] 28-Apr-1960 07/13/2014      Progress Note-Follow Up  Subjective  Chief Complaint  Chief Complaint  Patient presents with  . Follow-up    labs today  . Rash    HPI  Patient is a 54 y.o. male in today for routine medical care. Patient is in today for evaluation of a rash on his right arm. He describes it as itchy and it occurred after being outside working. Lesions are raised. He has no rash anywhere else. He otherwise feels well. Does acknowledge persistent fatigue. Is anxious to proceed with gastric bypass. Denies CP/palp/SOB/HA/congestion/fevers/GI or GU c/o. Taking meds as prescribed  Past Medical History  Diagnosis Date  . Hypertension   . Chicken pox as a child  . Low testosterone 10/28/2013  . Cutaneous skin tags 10/28/2013  . Anemia 10/28/2013  . Peripheral artery disease 11/03/2013  . Diabetes mellitus type 2 in obese 01/27/2014  . Tachycardia 05/15/2014    No past surgical history on file.  Family History  Problem Relation Age of Onset  . Fibromyalgia Mother   . Cancer Father     skin cancer  . Cataracts Father   . Arthritis Father   . Diabetes Maternal Grandfather     History   Social History  . Marital Status: Married    Spouse Name: N/A  . Number of Children: N/A  . Years of Education: N/A   Occupational History  . Manager [employer]   Social History Main Topics  . Smoking status: Never Smoker   . Smokeless tobacco: Not on file  . Alcohol Use: No     Comment: very rare. special occasions  . Drug Use: No  . Sexual Activity: Yes     Comment: lives with wife and children, works for [employer]   Other Topics Concern  . Not on file   Social History Narrative    Current Outpatient Prescriptions on File Prior to Visit  Medication Sig Dispense Refill  . sildenafil (REVATIO) 20 MG tablet Take 1 tablet (20 mg total) by mouth as directed. 1-4 tablet po daily prn for ED 50 tablet 0   No current  facility-administered medications on file prior to visit.    Allergies  Allergen Reactions  . Lisinopril-Hydrochlorothiazide Swelling    Lip swollen    Review of Systems  Review of Systems  Constitutional: Positive for malaise/fatigue. Negative for fever.  HENT: Negative for congestion.   Eyes: Negative for discharge.  Respiratory: Negative for shortness of breath.   Cardiovascular: Negative for chest pain, palpitations and leg swelling.  Gastrointestinal: Negative for nausea, abdominal pain and diarrhea.  Genitourinary: Negative for dysuria.  Musculoskeletal: Negative for falls.  Skin: Positive for itching and rash.  Neurological: Negative for loss of consciousness and headaches.  Endo/Heme/Allergies: Negative for polydipsia.  Psychiatric/Behavioral: Negative for depression and suicidal ideas. The patient is not nervous/anxious and does not have insomnia.     Objective  BP 136/84 mmHg  Pulse 70  Temp(Src) 98.1 F (36.7 C) (Oral)  Ht  (1.854 m)  Wt 434 lb 4 oz (196.975 kg)  BMI 57.30 kg/m2  SpO2 97%  Physical Exam  Physical Exam  Constitutional: He is oriented to person, place, and time and well-developed, well-nourished, and in no distress. No distress.  HENT:  Head: Normocephalic and atraumatic.  Eyes: Conjunctivae are normal.  Neck: Neck supple. No thyromegaly present.  Cardiovascular: Normal rate, regular rhythm and normal heart sounds.  No murmur heard. Pulmonary/Chest: Effort normal and breath sounds normal. No respiratory distress.  Abdominal: He exhibits no distension and no mass. There is no tenderness.  Musculoskeletal: He exhibits no edema.  Neurological: He is alert and oriented to person, place, and time.  Skin: Skin is warm.  Psychiatric: Memory, affect and judgment normal.    Lab Results  Component Value Date   TSH 5.05* 07/07/2014   Lab Results  Component Value Date   WBC 7.7 07/07/2014   HGB 14.1 07/07/2014   HCT 41.5 07/07/2014    MCV 88.9 07/07/2014   PLT 305.0 07/07/2014   Lab Results  Component Value Date   CREATININE 1.01 07/07/2014   BUN 11 07/07/2014   NA 137 07/07/2014   K 4.2 07/07/2014   CL 104 07/07/2014   CO2 27 07/07/2014   Lab Results  Component Value Date   ALT 24 07/07/2014   AST 17 07/07/2014   ALKPHOS 41 07/07/2014   BILITOT 0.4 07/07/2014   Lab Results  Component Value Date   CHOL 172 07/07/2014   Lab Results  Component Value Date   HDL 29.70* 07/07/2014   Lab Results  Component Value Date   LDLCALC 107* 07/07/2014   Lab Results  Component Value Date   TRIG 177.0* 07/07/2014   Lab Results  Component Value Date   CHOLHDL 6 07/07/2014     Assessment & Plan  HTN (hypertension) Well controlled, no changes to meds. Encouraged heart healthy diet such as the DASH diet and exercise as tolerated.    Diabetes mellitus type 2 in obese hgba1c acceptable, minimize simple carbs. Increase exercise as tolerated. Continue current meds   Morbid obesity Encouraged DASH diet, decrease po intake and increase exercise as tolerated. Needs 7-8 hours of sleep nightly. Avoid trans fats, eat small, frequent meals every 4-5 hours with lean proteins, complex carbs and healthy fats. Minimize simple carbs. Has undergone evaluation for gastric bypass he is anxious to proceed   Hyperlipidemia Encouraged heart healthy diet, increase exercise, avoid trans fats, consider a krill oil cap daily   Dermatitis Likely poison ivy. Given Triamcinolone cream prn.

## 2014-07-13 NOTE — Assessment & Plan Note (Addendum)
Likely poison ivy. Given Triamcinolone cream prn.

## 2014-07-13 NOTE — Assessment & Plan Note (Signed)
hgba1c acceptable, minimize simple carbs. Increase exercise as tolerated. Continue current meds 

## 2014-07-14 ENCOUNTER — Encounter: Payer: BLUE CROSS/BLUE SHIELD | Attending: General Surgery

## 2014-07-14 DIAGNOSIS — Z713 Dietary counseling and surveillance: Secondary | ICD-10-CM | POA: Diagnosis not present

## 2014-07-14 DIAGNOSIS — Z6841 Body Mass Index (BMI) 40.0 and over, adult: Secondary | ICD-10-CM | POA: Diagnosis not present

## 2014-07-16 NOTE — Progress Notes (Signed)
  Pre-Operative Nutrition Class:  Appt start time: 5726   End time:  1830.  Patient was seen on 07/14/14 for Pre-Operative Bariatric Surgery Education [facility].   Surgery date: 09/02/14 Surgery type: RYGB Start weight at [facility]: 433.5 lbs on 05/24/14 Weight today: 433.5  TANITA  BODY COMP RESULTS  07/14/14   BMI (kg/m^2) 57.2   Fat Mass (lbs) 125.5   Fat Free Mass (lbs) 308   Total Body Water (lbs) 225.5   Samples given per MNT protocol. Patient educated on appropriate usage: Premier protein shake (strawberry - qty 1) Lot #: [id number] Exp: 10/2014  Unjury protein powder (chocolate - qty 1) Lot #: [id number] Exp: 12/2014  PB2 (chocolate - qty 1) Lot #: none Exp: 01/2015  Bariatric Advantage Calcium Citrate chew (caramel - qty 1) Lot #: [id number] Exp: 07/2014  The following the learning objectives were met by the patient during this course:  Identify Pre-Op Dietary Goals and will begin 2 weeks pre-operatively  Identify appropriate sources of fluids and proteins   State protein recommendations and appropriate sources pre and post-operatively  Identify Post-Operative Dietary Goals and will follow for 2 weeks post-operatively  Identify appropriate multivitamin and calcium sources  Describe the need for physical activity post-operatively and will follow MD recommendations  State when to call healthcare provider regarding medication questions or post-operative complications  Handouts given during class include:  Pre-Op Bariatric Surgery Diet Handout  Protein Shake Handout  Post-Op Bariatric Surgery Nutrition Handout  BELT Program Information Flyer  Support Group Information Flyer  [facility] Bariatric Supplements Price List  Follow-Up Plan: Patient will follow-up at [facility] 2 weeks post operatively for diet advancement per MD.

## 2014-07-18 ENCOUNTER — Encounter: Payer: Self-pay | Admitting: Family Medicine

## 2014-07-20 ENCOUNTER — Other Ambulatory Visit: Payer: Self-pay | Admitting: Family Medicine

## 2014-07-20 ENCOUNTER — Encounter: Payer: Self-pay | Admitting: Family Medicine

## 2014-07-20 DIAGNOSIS — L309 Dermatitis, unspecified: Secondary | ICD-10-CM

## 2014-07-20 MED ORDER — TRIAMCINOLONE ACETONIDE 0.1 % EX CREA: 1.0000 "application " | TOPICAL_CREAM | Freq: Two times a day (BID) | CUTANEOUS | Status: DC | PRN

## 2014-08-21 ENCOUNTER — Other Ambulatory Visit: Payer: Self-pay | Admitting: Family Medicine

## 2014-08-21 ENCOUNTER — Encounter: Payer: Self-pay | Admitting: Family Medicine

## 2014-08-21 DIAGNOSIS — L309 Dermatitis, unspecified: Secondary | ICD-10-CM

## 2014-08-21 MED ORDER — MUPIROCIN 2 % EX OINT: 1.0000 "application " | TOPICAL_OINTMENT | Freq: Two times a day (BID) | CUTANEOUS | Status: DC

## 2014-08-21 MED ORDER — DOXYCYCLINE HYCLATE 100 MG PO CAPS: 100.0000 mg | ORAL_CAPSULE | Freq: Two times a day (BID) | ORAL | Status: DC

## 2014-08-22 ENCOUNTER — Encounter: Payer: Self-pay | Admitting: Family Medicine

## 2014-08-27 ENCOUNTER — Telehealth: Payer: Self-pay | Admitting: Family Medicine

## 2014-08-27 NOTE — Telephone Encounter (Signed)
Caller name: Luretha MurphyWayne Pecina Relationship to patient: self Can be reached: [phone] Pharmacy: [facility] in [place]  Reason for call: Pt sched for gastric bypass surgery next week (Tuesday 6/14). He states he is on a high protein diet and he is having trouble with constipation. He would ilke a stool softener called in if possible.

## 2014-08-28 ENCOUNTER — Ambulatory Visit: Payer: Self-pay | Admitting: General Surgery

## 2014-08-28 NOTE — H&P (Signed)
Jeffrey Molina 08/15/2014 10:15 AM Location: [facility] Patient #: [id number] DOB: 1960-11-04 Married / Language: Cleophus Molt / Race: White Male History of Present Illness Jeffrey Molina M. Jeffrey Mcroy MD; 08/28/2014 4:39 PM) Patient words: Pre-op Bari.  The patient is a 53 year old male who presents for a preoperative evaluation. He comes in today for his preoperative appointment. He is currently scheduled to undergo laparoscopic Roux-en-Y gastric bypass surgery on June 14. I initially met him in the office on January 7. His weight at that time was 426 pounds. He denies any changes since I initially met him. He denies any trips to the hospital emergency room. He denies any new allergies or medications. His upper GI demonstrated a small hiatal hernia. He states that he has rare reflux. His chest x-ray was within normal limits. His CBC and comprehensive metabolic panel were normal. His LDL lipid level was 123. His hemoglobin A1c was 7.8. He denies any chest pain, chest pressure, shortness of breath, exercise myocardial dyspnea, dyspnea on exertion, TIAs or amaurosis fugax Problem List/Past Medical Jeffrey Curry, MD; 08/28/2014 4:41 PM) ARTHRITIS OF BOTH KNEES (716.96  M19.90) Gastroesophageal Reflux Disease ESSENTIAL HYPERTENSION WITH GOAL BLOOD PRESSURE LESS THAN 130/80 (401.9  I10) Hypercholesterolemia OBSTRUCTIVE SLEEP APNEA ON CPAP (327.23  G47.33) OBESITY, MORBID, BMI 50 OR HIGHER (278.01  E66.01) CONTROLLED TYPE 2 DIABETES MELLITUS WITHOUT COMPLICATION, WITHOUT LONG-TERM CURRENT USE OF INSULIN (250.00  E11.9)  Past Surgical History Jeffrey Curry, MD; 08/28/2014 4:41 PM) Colon Polyp Removal - Colonoscopy  Diagnostic Studies History Jeffrey Curry, MD; 08/28/2014 4:41 PM) Colonoscopy 1-5 years ago  Allergies Jeffrey Crate, LPN; 06/16/9240 68:34 AM) No Known Drug Allergies 03/27/2014  Medication History Jeffrey Curry, MD; 08/28/2014 4:41 PM) Ranitidine HCl (300MG  Tablet, Oral) Active. Sildenafil Citrate (20MG Tablet, Oral) Active. Ibuprofen (400MG Tablet, Oral) Active. Metoprolol Succinate ER (25MG Tablet ER 24HR, Oral) Active. Medications Reconciled OxyCODONE HCl (5MG/5ML Solution, 5-10 Milliliter Oral every four hours, as needed, Taken starting 08/15/2014) Active.  Social History Jeffrey Curry, MD; 08/28/2014 4:41 PM) No drug use Tobacco use Never smoker. Alcohol use Occasional alcohol use. Caffeine use Carbonated beverages.  Family History Jeffrey Curry, MD; 08/28/2014 4:41 PM) Arthritis Father. Cancer Mother. Colon Polyps Father. Thyroid problems Daughter.     Review of Systems Jeffrey Curry, MD; 08/28/2014 4:35 00) General Not Present- Appetite Loss, Chills, Fatigue, Fever, Night Sweats, Weight Gain and Weight Loss. Skin Present- Dryness. Not Present- Change in Wart/Mole, Hives, Jaundice, New Lesions, Non-Healing Wounds, Rash and Ulcer. HEENT Present- Wears glasses/contact lenses. Not Present- Earache, Hearing Loss, Hoarseness, Nose Bleed, Oral Ulcers, Ringing in the Ears, Seasonal Allergies, Sinus Pain, Sore Throat, Visual Disturbances and Yellow Eyes. Respiratory Present- Chronic Cough and Snoring. Not Present- Bloody sputum, Difficulty Breathing and Wheezing. Breast Not Present- Breast Mass, Breast Pain, Nipple Discharge and Skin Changes. Cardiovascular Present- Swelling of Extremities. Not Present- Chest Pain, Difficulty Breathing Lying Down, Leg Cramps, Palpitations, Rapid Heart Rate and Shortness of Breath. Gastrointestinal Not Present- Abdominal Pain, Bloating, Bloody Stool, Change in Bowel Habits, Chronic diarrhea, Constipation, Difficulty Swallowing, Excessive gas, Gets full quickly at meals, Hemorrhoids, Indigestion, Nausea, Rectal Pain and Vomiting. Male Genitourinary Present- Frequency and Impotence. Not Present- Blood in Urine, Change in Urinary Stream, Nocturia, Painful Urination, Urgency and Urine  Leakage. Musculoskeletal Present- Joint Pain. Not Present- Back Pain, Joint Stiffness, Muscle Pain, Muscle Weakness and Swelling of Extremities. Neurological Present- Trouble walking. Not Present- Decreased Memory, Fainting, Headaches, Numbness, Seizures,  Tingling, Tremor and Weakness. Psychiatric Not Present- Anxiety, Bipolar, Change in Sleep Pattern, Depression, Fearful and Frequent crying. Endocrine Present- New Diabetes. Not Present- Cold Intolerance, Excessive Hunger, Hair Changes and Heat Intolerance. Hematology Not Present- Easy Bruising, Excessive bleeding, Gland problems, HIV and Persistent Infections.  Vitals Jeffrey [facility] LPN; 9/52/8413 24:40 AM) 08/15/2014 10:13 AM Weight: 436.38 lb Height: 73in Body Surface Area: 3.19 m Body Mass Index: 57.57 kg/m Temp.: 98.59F(Oral)  Pulse: 80 (Regular)  BP: 152/78 (Sitting, Left Arm, Standard)     Physical Exam Jeffrey Molina M. Jeffrey Hippert MD; 08/28/2014 4:35 PM)  General Mental Status-Alert. General Appearance-Consistent with stated age. Hydration-Well hydrated. Voice-Normal. Note: morbidly obese; evenly distributed   Head and Neck Head-normocephalic, atraumatic with no lesions or palpable masses. Trachea-midline. Thyroid Gland Characteristics - normal size and consistency.  Eye Eyeball - Bilateral-Extraocular movements intact. Sclera/Conjunctiva - Bilateral-No scleral icterus.  Chest and Lung Exam Chest and lung exam reveals -quiet, even and easy respiratory effort with no use of accessory muscles and on auscultation, normal breath sounds, no adventitious sounds and normal vocal resonance. Inspection Chest Wall - Normal. Back - normal.  Breast - Did not examine.  Cardiovascular Cardiovascular examination reveals -normal heart sounds, regular rate and rhythm with no murmurs and normal pedal pulses bilaterally.  Abdomen Inspection Inspection of the abdomen reveals - No Hernias. Skin - Scar - no  surgical scars. Palpation/Percussion Palpation and Percussion of the abdomen reveal - Soft, Non Tender, No Rebound tenderness, No Rigidity (guarding) and No hepatosplenomegaly. Auscultation Auscultation of the abdomen reveals - Bowel sounds normal.  Peripheral Vascular Upper Extremity Palpation - Pulses bilaterally normal.  Neurologic Neurologic evaluation reveals -alert and oriented x 3 with no impairment of recent or remote memory. Mental Status-Normal.  Neuropsychiatric The patient's mood and affect are described as -normal. Judgment and Insight-insight is appropriate concerning matters relevant to self.  Musculoskeletal Normal Exam - Left-Upper Extremity Strength Normal and Lower Extremity Strength Normal. Normal Exam - Right-Upper Extremity Strength Normal and Lower Extremity Strength Normal. Note: some crepitus L knee   Lymphatic Head & Neck  General Head & Neck Lymphatics: Bilateral - Description - Normal. Axillary - Did not examine. Femoral & Inguinal - Did not examine. Note: some brawny skin on LLE; less extent on RLE     Assessment & Plan Jeffrey Molina M. Nehemie Casserly MD; 08/28/2014 4:41 PM)  OBESITY, MORBID, BMI 50 OR HIGHER (278.01  E66.01) Impression: We discussed his preoperative workup. We discussed the results of his upper GI and blood work. We discussed the importance of the preoperative diet. Highly encouraged him to be as compliant as possible with the preoperative diet in order to loosen additional weight as well as to shrink his liver to make surgery technically safer and easier. We discussed the typical operative course as well as postoperative course. He was given his bowel prep instructions today. He was given his postoperative pain medicine prescription today. I encouraged him to contact the office should he have any additional questions between now and surgery  Current Plans Pt Education - EMW_preopbariatric Started OxyCODONE HCl 5MG/5ML, 5-10  Milliliter every four hours, as needed, 200 Milliliter, 08/15/2014, No Refill. OBSTRUCTIVE SLEEP APNEA ON CPAP (327.23  G47.33)  ARTHRITIS OF BOTH KNEES (716.96  M19.90)  ESSENTIAL HYPERTENSION WITH GOAL BLOOD PRESSURE LESS THAN 130/80 (401.9  I10)  CONTROLLED TYPE 2 DIABETES MELLITUS WITHOUT COMPLICATION, WITHOUT LONG-TERM CURRENT USE OF INSULIN (250.00  E11.9)  Leighton Ruff. Redmond Pulling, MD, FACS General, Bariatric, & Minimally Invasive Surgery [facility] [place]

## 2014-08-28 NOTE — Patient Instructions (Addendum)
Rogerio Angley  08/28/2014   Your procedure is scheduled on: 09-02-14  Report to [facility] Main  Entrance and follow signs to               Short Stay Center at  5:15 AM.  Call this number if you have problems the morning of surgery [phone]   Remember: ONLY 1 PERSON MAY GO WITH YOU TO SHORT STAY TO GET  READY MORNING OF YOUR SURGERY.  Do not eat food or drink liquids :After Midnight.               Bowel prep as instructed by physician's office                          Bring C-Pap machine, mask and tubing   Take these medicines the morning of surgery with A SIP OF WATER: Toprol XL                               You may not have any metal on your body including hair pins and              piercings  Do not wear jewelry,  lotions, powders or perfumes, deodorant                       Men may shave face and neck.   Do not bring valuables to the hospital. [employer] IS NOT             RESPONSIBLE   FOR VALUABLES.  Contacts, dentures or bridgework may not be worn into surgery.  Leave suitcase in the car. After surgery it may be brought to your room.    Marland Kitchen    Special Instructions: coughing and deep breathing exercises, leg exercises.              Please read over the following fact sheets you were given: _____________________________________________________________________             [facility] - Preparing for Surgery Before surgery, you can play an important role.  Because skin is not sterile, your skin needs to be as free of germs as possible.  You can reduce the number of germs on your skin by washing with CHG (chlorahexidine gluconate) soap before surgery.  CHG is an antiseptic cleaner which kills germs and bonds with the skin to continue killing germs even after washing. Please DO NOT use if you have an allergy to CHG or antibacterial soaps.  If your skin becomes reddened/irritated stop using the CHG and inform your nurse when you arrive at Short  Stay. Do not shave (including legs and underarms) for at least 48 hours prior to the first CHG shower.  You may shave your face/neck. Please follow these instructions carefully:  1.  Shower with CHG Soap the night before surgery and the  morning of Surgery.  2.  If you choose to wash your hair, wash your hair first as usual with your  normal  shampoo.  3.  After you shampoo, rinse your hair and body thoroughly to remove the  shampoo.                           4.  Use CHG as you would any  other liquid soap.  You can apply chg directly  to the skin and wash                       Gently with a scrungie or clean washcloth.  5.  Apply the CHG Soap to your body ONLY FROM THE NECK DOWN.   Do not use on face/ open                           Wound or open sores. Avoid contact with eyes, ears mouth and genitals (private parts).                       Wash face,  Genitals (private parts) with your normal soap.             6.  Wash thoroughly, paying special attention to the area where your surgery  will be performed.  7.  Thoroughly rinse your body with warm water from the neck down.  8.  DO NOT shower/wash with your normal soap after using and rinsing off  the CHG Soap.                9.  Pat yourself dry with a clean towel.            10.  Wear clean pajamas.            11.  Place clean sheets on your bed the night of your first shower and do not  sleep with pets. Day of Surgery : Do not apply any lotions/deodorants the morning of surgery.  Please wear clean clothes to the hospital/surgery center.  FAILURE TO FOLLOW THESE INSTRUCTIONS MAY RESULT IN THE CANCELLATION OF YOUR SURGERY PATIENT SIGNATURE_________________________________  NURSE SIGNATURE__________________________________  ________________________________________________________________________

## 2014-08-28 NOTE — Telephone Encounter (Signed)
Stool softeners are otc have him start 2 colace/docusate daily

## 2014-08-29 ENCOUNTER — Encounter (HOSPITAL_COMMUNITY)
Admission: RE | Admit: 2014-08-29 | Discharge: 2014-08-29 | Disposition: A | Discharge status: Home / Self Care | Payer: BLUE CROSS/BLUE SHIELD | Source: Ambulatory Visit | Attending: General Surgery | Admitting: General Surgery

## 2014-08-29 ENCOUNTER — Encounter (HOSPITAL_COMMUNITY): Payer: Self-pay

## 2014-08-29 DIAGNOSIS — Z01812 Encounter for preprocedural laboratory examination: Secondary | ICD-10-CM | POA: Insufficient documentation

## 2014-08-29 DIAGNOSIS — Z6841 Body Mass Index (BMI) 40.0 and over, adult: Secondary | ICD-10-CM | POA: Insufficient documentation

## 2014-08-29 HISTORY — DX: Unspecified osteoarthritis, unspecified site: M19.90

## 2014-08-29 HISTORY — DX: Gastro-esophageal reflux disease without esophagitis: K21.9

## 2014-08-29 HISTORY — DX: Sleep apnea, unspecified: G47.30

## 2014-08-29 LAB — CBC WITH DIFFERENTIAL/PLATELET
BASOS PCT: 1 % (ref 0–1)
Basophils Absolute: 0.1 10*3/uL (ref 0.0–0.1)
EOS PCT: 4 % (ref 0–5)
Eosinophils Absolute: 0.3 10*3/uL (ref 0.0–0.7)
HCT: 44.1 % (ref 39.0–52.0)
Hemoglobin: 14 g/dL (ref 13.0–17.0)
LYMPHS ABS: 1.2 10*3/uL (ref 0.7–4.0)
LYMPHS PCT: 16 % (ref 12–46)
MCH: 29.4 pg (ref 26.0–34.0)
MCHC: 31.7 g/dL (ref 30.0–36.0)
MCV: 92.6 fL (ref 78.0–100.0)
Monocytes Absolute: 0.7 10*3/uL (ref 0.1–1.0)
Monocytes Relative: 10 % (ref 3–12)
NEUTROS PCT: 69 % (ref 43–77)
Neutro Abs: 4.9 10*3/uL (ref 1.7–7.7)
PLATELETS: 269 10*3/uL (ref 150–400)
RBC: 4.76 MIL/uL (ref 4.22–5.81)
RDW: 13.6 % (ref 11.5–15.5)
WBC: 7.1 10*3/uL (ref 4.0–10.5)

## 2014-08-29 LAB — COMPREHENSIVE METABOLIC PANEL WITH GFR
ALT: 37 U/L (ref 17–63)
AST: 31 U/L (ref 15–41)
Albumin: 4 g/dL (ref 3.5–5.0)
Alkaline Phosphatase: 37 U/L — ABNORMAL LOW (ref 38–126)
Anion gap: 9 (ref 5–15)
BUN: 20 mg/dL (ref 6–20)
CO2: 26 mmol/L (ref 22–32)
Calcium: 9.1 mg/dL (ref 8.9–10.3)
Chloride: 102 mmol/L (ref 101–111)
Creatinine, Ser: 0.94 mg/dL (ref 0.61–1.24)
GFR calc Af Amer: >60 mL/min
GFR calc non Af Amer: >60 mL/min
Glucose, Bld: 105 mg/dL — ABNORMAL HIGH (ref 65–99)
Potassium: 4 mmol/L (ref 3.5–5.1)
Sodium: 137 mmol/L (ref 135–145)
Total Bilirubin: 0.6 mg/dL (ref 0.3–1.2)
Total Protein: 7.1 g/dL (ref 6.5–8.1)

## 2014-08-29 NOTE — Progress Notes (Signed)
08-29-14 at preop appt., Called [employer] and talked to [place].  Advised patient will need a bariatric bed with trapeze, surgery on 09-02-14 from 0715 - 1048.  Order placed for bariatric bed with trapeze on 08-29-14 at preop appt., sign and held to be released on morning of surgery

## 2014-08-29 NOTE — Telephone Encounter (Signed)
Patient informed. 

## 2014-08-29 NOTE — Progress Notes (Addendum)
EKG 04-21-14 EPIC CXR 04-21-14 EPIC  LOV 07-07-14 Dr. Abner Greenspan (PCP) EPIC Patient states has bowel prep instructions given by surgeon's office Pt. Advised to hold herbs, nsaids, asa, plavix 7 days prior to surgery Pt. Will bring c-pap machiine, mask and tubing morning of surgery

## 2014-09-02 ENCOUNTER — Inpatient Hospital Stay (HOSPITAL_COMMUNITY): Payer: BLUE CROSS/BLUE SHIELD | Admitting: Anesthesiology

## 2014-09-02 ENCOUNTER — Encounter (HOSPITAL_COMMUNITY)
Admission: RE | Disposition: A | Discharge status: Home / Self Care | Payer: Self-pay | Source: Ambulatory Visit | Attending: General Surgery

## 2014-09-02 ENCOUNTER — Inpatient Hospital Stay (HOSPITAL_COMMUNITY)
Admission: RE | Admit: 2014-09-02 | Discharge: 2014-09-04 | DRG: 621 | Disposition: A | Discharge status: Home / Self Care | Payer: BLUE CROSS/BLUE SHIELD | Source: Ambulatory Visit | Attending: General Surgery | Admitting: General Surgery

## 2014-09-02 ENCOUNTER — Encounter (HOSPITAL_COMMUNITY): Payer: Self-pay | Admitting: *Deleted

## 2014-09-02 DIAGNOSIS — R110 Nausea: Secondary | ICD-10-CM

## 2014-09-02 DIAGNOSIS — G4733 Obstructive sleep apnea (adult) (pediatric): Secondary | ICD-10-CM | POA: Diagnosis present

## 2014-09-02 DIAGNOSIS — M1710 Unilateral primary osteoarthritis, unspecified knee: Secondary | ICD-10-CM | POA: Diagnosis present

## 2014-09-02 DIAGNOSIS — Z86010 Personal history of colonic polyps: Secondary | ICD-10-CM | POA: Diagnosis not present

## 2014-09-02 DIAGNOSIS — E1169 Type 2 diabetes mellitus with other specified complication: Secondary | ICD-10-CM | POA: Diagnosis present

## 2014-09-02 DIAGNOSIS — Z6841 Body Mass Index (BMI) 40.0 and over, adult: Secondary | ICD-10-CM

## 2014-09-02 DIAGNOSIS — E785 Hyperlipidemia, unspecified: Secondary | ICD-10-CM | POA: Diagnosis present

## 2014-09-02 DIAGNOSIS — M179 Osteoarthritis of knee, unspecified: Secondary | ICD-10-CM | POA: Diagnosis present

## 2014-09-02 DIAGNOSIS — E669 Obesity, unspecified: Secondary | ICD-10-CM

## 2014-09-02 DIAGNOSIS — K219 Gastro-esophageal reflux disease without esophagitis: Secondary | ICD-10-CM | POA: Diagnosis present

## 2014-09-02 DIAGNOSIS — Z9989 Dependence on other enabling machines and devices: Secondary | ICD-10-CM

## 2014-09-02 DIAGNOSIS — I10 Essential (primary) hypertension: Secondary | ICD-10-CM | POA: Diagnosis present

## 2014-09-02 DIAGNOSIS — Z9884 Bariatric surgery status: Secondary | ICD-10-CM

## 2014-09-02 HISTORY — PX: GASTRIC ROUX-EN-Y: SHX5262

## 2014-09-02 LAB — GLUCOSE, CAPILLARY
Glucose-Capillary: 105 mg/dL — ABNORMAL HIGH (ref 65–99)
Glucose-Capillary: 151 mg/dL — ABNORMAL HIGH (ref 65–99)

## 2014-09-02 LAB — HEMOGLOBIN AND HEMATOCRIT, BLOOD
HCT: 42.2 % (ref 39.0–52.0)
HEMOGLOBIN: 13.6 g/dL (ref 13.0–17.0)

## 2014-09-02 SURGERY — LAPAROSCOPIC ROUX-EN-Y GASTRIC BYPASS WITH UPPER ENDOSCOPY
Anesthesia: General | Site: Abdomen

## 2014-09-02 MED ORDER — SODIUM CHLORIDE 0.9 % IJ SOLN
INTRAMUSCULAR | Status: AC
  Filled 2014-09-02: qty 10

## 2014-09-02 MED ORDER — SUCCINYLCHOLINE CHLORIDE 20 MG/ML IJ SOLN
INTRAMUSCULAR | Status: DC | PRN
  Administered 2014-09-02: 160 mg via INTRAVENOUS
  Administered 2014-09-02: 40 mg via INTRAVENOUS

## 2014-09-02 MED ORDER — ACETAMINOPHEN 10 MG/ML IV SOLN
INTRAVENOUS | Status: AC
  Filled 2014-09-02: qty 100

## 2014-09-02 MED ORDER — TISSEEL VH 10 ML EX KIT
PACK | CUTANEOUS | Status: AC
  Filled 2014-09-02: qty 1

## 2014-09-02 MED ORDER — LACTATED RINGERS IV SOLN
INTRAVENOUS | Status: DC | PRN
  Administered 2014-09-02 (×3): via INTRAVENOUS

## 2014-09-02 MED ORDER — ONDANSETRON HCL 4 MG/2ML IJ SOLN
INTRAMUSCULAR | Status: DC | PRN
  Administered 2014-09-02: 4 mg via INTRAVENOUS

## 2014-09-02 MED ORDER — LIDOCAINE HCL (CARDIAC) 20 MG/ML IV SOLN
INTRAVENOUS | Status: AC
  Filled 2014-09-02: qty 5

## 2014-09-02 MED ORDER — HYDROMORPHONE HCL 2 MG/ML IJ SOLN
INTRAMUSCULAR | Status: AC
  Filled 2014-09-02: qty 1

## 2014-09-02 MED ORDER — PROPOFOL 10 MG/ML IV BOLUS
INTRAVENOUS | Status: AC
  Filled 2014-09-02: qty 20

## 2014-09-02 MED ORDER — PROPOFOL 10 MG/ML IV BOLUS
INTRAVENOUS | Status: DC | PRN
Start: 2014-09-02 — End: 2014-09-02
  Administered 2014-09-02: 250 mg via INTRAVENOUS
  Administered 2014-09-02: 100 mg via INTRAVENOUS

## 2014-09-02 MED ORDER — METOPROLOL TARTRATE 1 MG/ML IV SOLN
5.0000 mg | Freq: Four times a day (QID) | INTRAVENOUS | Status: DC
  Administered 2014-09-03 – 2014-09-04 (×4): 5 mg via INTRAVENOUS
  Filled 2014-09-02 (×8): qty 5

## 2014-09-02 MED ORDER — PANTOPRAZOLE SODIUM 40 MG IV SOLR
40.0000 mg | Freq: Every day | INTRAVENOUS | Status: DC
  Administered 2014-09-02 – 2014-09-03 (×2): 40 mg via INTRAVENOUS
  Filled 2014-09-02 (×3): qty 40

## 2014-09-02 MED ORDER — HYDROMORPHONE HCL 1 MG/ML IJ SOLN
INTRAMUSCULAR | Status: DC | PRN
  Administered 2014-09-02 (×3): .4 mg via INTRAVENOUS

## 2014-09-02 MED ORDER — SODIUM CHLORIDE 0.9 % IJ SOLN
INTRAMUSCULAR | Status: AC
  Filled 2014-09-02: qty 50

## 2014-09-02 MED ORDER — NEOSTIGMINE METHYLSULFATE 10 MG/10ML IV SOLN
INTRAVENOUS | Status: AC
  Filled 2014-09-02: qty 1

## 2014-09-02 MED ORDER — MIDAZOLAM HCL 2 MG/2ML IJ SOLN
INTRAMUSCULAR | Status: AC
  Filled 2014-09-02: qty 2

## 2014-09-02 MED ORDER — OXYCODONE HCL 5 MG/5ML PO SOLN: 5.0000 mg | ORAL | Status: DC | PRN

## 2014-09-02 MED ORDER — DEXAMETHASONE SODIUM PHOSPHATE 10 MG/ML IJ SOLN
INTRAMUSCULAR | Status: AC
  Filled 2014-09-02: qty 1

## 2014-09-02 MED ORDER — ONDANSETRON HCL 4 MG/2ML IJ SOLN
INTRAMUSCULAR | Status: AC
  Filled 2014-09-02: qty 2

## 2014-09-02 MED ORDER — EPHEDRINE SULFATE 50 MG/ML IJ SOLN
INTRAMUSCULAR | Status: DC | PRN
  Administered 2014-09-02 (×2): 5 mg via INTRAVENOUS

## 2014-09-02 MED ORDER — ACETAMINOPHEN 160 MG/5ML PO SOLN: 650.0000 mg | ORAL | Status: DC | PRN

## 2014-09-02 MED ORDER — CHLORHEXIDINE GLUCONATE 4 % EX LIQD: 60.0000 mL | Freq: Once | CUTANEOUS | Status: DC

## 2014-09-02 MED ORDER — CEFOTETAN DISODIUM-DEXTROSE 2-2.08 GM-% IV SOLR
2.0000 g | INTRAVENOUS | Status: AC
  Administered 2014-09-02: 2 g via INTRAVENOUS

## 2014-09-02 MED ORDER — TISSEEL VH 10 ML EX KIT
PACK | CUTANEOUS | Status: DC | PRN
  Administered 2014-09-02: 10 mL

## 2014-09-02 MED ORDER — BUPIVACAINE LIPOSOME 1.3 % IJ SUSP
20.0000 mL | Freq: Once | INTRAMUSCULAR | Status: AC
  Administered 2014-09-02: 20 mL
  Filled 2014-09-02: qty 20

## 2014-09-02 MED ORDER — PROMETHAZINE HCL 25 MG/ML IJ SOLN: 12.5000 mg | Freq: Four times a day (QID) | INTRAMUSCULAR | Status: DC | PRN

## 2014-09-02 MED ORDER — HYDROMORPHONE HCL 1 MG/ML IJ SOLN
INTRAMUSCULAR | Status: AC
  Filled 2014-09-02: qty 1

## 2014-09-02 MED ORDER — HEPARIN SODIUM (PORCINE) 5000 UNIT/ML IJ SOLN
5000.0000 [IU] | INTRAMUSCULAR | Status: AC
  Administered 2014-09-02: 5000 [IU] via SUBCUTANEOUS
  Filled 2014-09-02: qty 1

## 2014-09-02 MED ORDER — CISATRACURIUM BESYLATE 20 MG/10ML IV SOLN
INTRAVENOUS | Status: AC
  Filled 2014-09-02: qty 10

## 2014-09-02 MED ORDER — ACETAMINOPHEN 160 MG/5ML PO SOLN
325.0000 mg | ORAL | Status: DC | PRN
Start: 2014-09-03 — End: 2014-09-04

## 2014-09-02 MED ORDER — LIDOCAINE HCL (CARDIAC) 20 MG/ML IV SOLN
INTRAVENOUS | Status: DC | PRN
  Administered 2014-09-02: 100 mg via INTRAVENOUS

## 2014-09-02 MED ORDER — MIDAZOLAM HCL 5 MG/5ML IJ SOLN
INTRAMUSCULAR | Status: DC | PRN
  Administered 2014-09-02: 2 mg via INTRAVENOUS

## 2014-09-02 MED ORDER — DEXAMETHASONE SODIUM PHOSPHATE 10 MG/ML IJ SOLN
INTRAMUSCULAR | Status: DC | PRN
  Administered 2014-09-02: 10 mg via INTRAVENOUS

## 2014-09-02 MED ORDER — LIDOCAINE HCL 2 % EX GEL
CUTANEOUS | Status: AC
  Filled 2014-09-02: qty 10

## 2014-09-02 MED ORDER — POTASSIUM CHLORIDE IN NACL 20-0.45 MEQ/L-% IV SOLN
INTRAVENOUS | Status: DC
  Administered 2014-09-02 – 2014-09-03 (×3): via INTRAVENOUS
  Administered 2014-09-03: 125 mL/h via INTRAVENOUS
  Filled 2014-09-02 (×14): qty 1000

## 2014-09-02 MED ORDER — UNJURY VANILLA POWDER: 2.0000 [oz_av] | Freq: Four times a day (QID) | ORAL | Status: DC

## 2014-09-02 MED ORDER — ACETAMINOPHEN 10 MG/ML IV SOLN
INTRAVENOUS | Status: DC | PRN
  Administered 2014-09-02: 1000 mg via INTRAVENOUS

## 2014-09-02 MED ORDER — NEOSTIGMINE METHYLSULFATE 10 MG/10ML IV SOLN
INTRAVENOUS | Status: DC | PRN
  Administered 2014-09-02: 5 mg via INTRAVENOUS

## 2014-09-02 MED ORDER — UNJURY CHOCOLATE CLASSIC POWDER: 2.0000 [oz_av] | Freq: Four times a day (QID) | ORAL | Status: DC

## 2014-09-02 MED ORDER — SUFENTANIL CITRATE 50 MCG/ML IV SOLN
INTRAVENOUS | Status: AC
  Filled 2014-09-02: qty 1

## 2014-09-02 MED ORDER — BUPIVACAINE-EPINEPHRINE 0.25% -1:200000 IJ SOLN
INTRAMUSCULAR | Status: AC
  Filled 2014-09-02: qty 1

## 2014-09-02 MED ORDER — LACTATED RINGERS IV SOLN: INTRAVENOUS | Status: DC

## 2014-09-02 MED ORDER — HYDROMORPHONE HCL 1 MG/ML IJ SOLN
0.2500 mg | INTRAMUSCULAR | Status: DC | PRN
  Administered 2014-09-02 (×4): 0.25 mg via INTRAVENOUS

## 2014-09-02 MED ORDER — GLYCOPYRROLATE 0.2 MG/ML IJ SOLN
INTRAMUSCULAR | Status: AC
  Filled 2014-09-02: qty 4

## 2014-09-02 MED ORDER — GLYCOPYRROLATE 0.2 MG/ML IJ SOLN
INTRAMUSCULAR | Status: AC
  Filled 2014-09-02: qty 1

## 2014-09-02 MED ORDER — CISATRACURIUM BESYLATE (PF) 10 MG/5ML IV SOLN
INTRAVENOUS | Status: DC | PRN
  Administered 2014-09-02: 14 mg via INTRAVENOUS
  Administered 2014-09-02: 2 mg via INTRAVENOUS
  Administered 2014-09-02: 4 mg via INTRAVENOUS
  Administered 2014-09-02: 6 mg via INTRAVENOUS

## 2014-09-02 MED ORDER — METHOCARBAMOL 1000 MG/10ML IJ SOLN
1000.0000 mg | Freq: Three times a day (TID) | INTRAVENOUS | Status: DC
  Administered 2014-09-02 – 2014-09-04 (×6): 1000 mg via INTRAVENOUS
  Filled 2014-09-02 (×7): qty 10

## 2014-09-02 MED ORDER — ONDANSETRON HCL 4 MG/2ML IJ SOLN: 4.0000 mg | INTRAMUSCULAR | Status: DC | PRN

## 2014-09-02 MED ORDER — LACTATED RINGERS IR SOLN
Status: DC | PRN
  Administered 2014-09-02: 1

## 2014-09-02 MED ORDER — ENOXAPARIN SODIUM 30 MG/0.3ML ~~LOC~~ SOLN
30.0000 mg | Freq: Two times a day (BID) | SUBCUTANEOUS | Status: DC
  Administered 2014-09-03 – 2014-09-04 (×3): 30 mg via SUBCUTANEOUS
  Filled 2014-09-02 (×5): qty 0.3

## 2014-09-02 MED ORDER — MORPHINE SULFATE 2 MG/ML IJ SOLN
2.0000 mg | INTRAMUSCULAR | Status: DC | PRN
  Administered 2014-09-02 (×2): 2 mg via INTRAVENOUS
  Administered 2014-09-02: 4 mg via INTRAVENOUS
  Administered 2014-09-03 (×2): 2 mg via INTRAVENOUS
  Filled 2014-09-02: qty 1
  Filled 2014-09-02: qty 2
  Filled 2014-09-02 (×4): qty 1

## 2014-09-02 MED ORDER — UNJURY CHICKEN SOUP POWDER
2.0000 [oz_av] | Freq: Four times a day (QID) | ORAL | Status: DC
  Administered 2014-09-04: 2 [oz_av] via ORAL

## 2014-09-02 MED ORDER — SUFENTANIL CITRATE 50 MCG/ML IV SOLN
INTRAVENOUS | Status: DC | PRN
  Administered 2014-09-02: 20 ug via INTRAVENOUS
  Administered 2014-09-02 (×3): 10 ug via INTRAVENOUS

## 2014-09-02 MED ORDER — CEFOTETAN DISODIUM-DEXTROSE 2-2.08 GM-% IV SOLR
INTRAVENOUS | Status: AC
  Filled 2014-09-02: qty 100

## 2014-09-02 MED ORDER — EPHEDRINE SULFATE 50 MG/ML IJ SOLN
INTRAMUSCULAR | Status: AC
  Filled 2014-09-02: qty 1

## 2014-09-02 MED ORDER — GLYCOPYRROLATE 0.2 MG/ML IJ SOLN
INTRAMUSCULAR | Status: DC | PRN
  Administered 2014-09-02: .8 mg via INTRAVENOUS
  Administered 2014-09-02: 0.2 mg via INTRAVENOUS

## 2014-09-02 SURGICAL SUPPLY — 65 items
ADH SKN CLS APL DERMABOND .7 (GAUZE/BANDAGES/DRESSINGS) ×1
APL SRG 32X5 SNPLK LF DISP (MISCELLANEOUS) ×1
APPLICATOR COTTON TIP 6IN STRL (MISCELLANEOUS) ×6 IMPLANT
BLADE SURG SZ11 CARB STEEL (BLADE) ×3 IMPLANT
CABLE HIGH FREQUENCY MONO STRZ (ELECTRODE) ×3 IMPLANT
CANISTER SUCTION 1500CC (MISCELLANEOUS) ×3 IMPLANT
CHLORAPREP W/TINT 26ML (MISCELLANEOUS) ×6 IMPLANT
CLIP SUT LAPRA TY ABSORB (SUTURE) ×6 IMPLANT
CUTTER FLEX LINEAR 45M (STAPLE) IMPLANT
DERMABOND ADVANCED (GAUZE/BANDAGES/DRESSINGS) ×2
DERMABOND ADVANCED .7 DNX12 (GAUZE/BANDAGES/DRESSINGS) ×1 IMPLANT
DEVICE SUTURE ENDOST 10MM (ENDOMECHANICALS) ×3 IMPLANT
DRAIN PENROSE 18X1/4 LTX STRL (WOUND CARE) ×3 IMPLANT
DRAPE CAMERA CLOSED 9X96 (DRAPES) ×3 IMPLANT
ELECT REM PT RETURN 9FT ADLT (ELECTROSURGICAL) ×3
ELECTRODE REM PT RTRN 9FT ADLT (ELECTROSURGICAL) ×1 IMPLANT
GAUZE SPONGE 4X4 12PLY STRL (GAUZE/BANDAGES/DRESSINGS) IMPLANT
GAUZE SPONGE 4X4 16PLY XRAY LF (GAUZE/BANDAGES/DRESSINGS) ×3 IMPLANT
GLOVE BIOGEL M STRL SZ7.5 (GLOVE) ×6 IMPLANT
GOWN STRL REUS W/TWL XL LVL3 (GOWN DISPOSABLE) ×12 IMPLANT
HOVERMATT SINGLE USE (MISCELLANEOUS) ×3 IMPLANT
KIT BASIN OR (CUSTOM PROCEDURE TRAY) ×3 IMPLANT
KIT GASTRIC LAVAGE 34FR ADT (SET/KITS/TRAYS/PACK) ×3 IMPLANT
LIQUID BAND (GAUZE/BANDAGES/DRESSINGS) ×3 IMPLANT
NEEDLE SPNL 22GX3.5 QUINCKE BK (NEEDLE) ×3 IMPLANT
PACK CARDIOVASCULAR III (CUSTOM PROCEDURE TRAY) ×3 IMPLANT
PEN SKIN MARKING BROAD (MISCELLANEOUS) ×3 IMPLANT
RELOAD 45 VASCULAR/THIN (ENDOMECHANICALS) IMPLANT
RELOAD ENDO STITCH 2.0 (ENDOMECHANICALS) ×33
RELOAD STAPLE TA45 3.5 REG BLU (ENDOMECHANICALS) IMPLANT
RELOAD STAPLER BLUE 60MM (STAPLE) ×3 IMPLANT
RELOAD STAPLER GOLD 60MM (STAPLE) ×1 IMPLANT
RELOAD STAPLER WHITE 60MM (STAPLE) ×2 IMPLANT
SCISSORS LAP 5X45 EPIX DISP (ENDOMECHANICALS) ×3 IMPLANT
SEALANT SURGICAL APPL DUAL CAN (MISCELLANEOUS) ×3 IMPLANT
SET IRRIG TUBING LAPAROSCOPIC (IRRIGATION / IRRIGATOR) ×3 IMPLANT
SHEARS HARMONIC ACE PLUS 45CM (MISCELLANEOUS) ×3 IMPLANT
SLEEVE ADV FIXATION 12X100MM (TROCAR) ×6 IMPLANT
SLEEVE XCEL OPT CAN 5 100 (ENDOMECHANICALS) ×3 IMPLANT
SOLUTION ANTI FOG 6CC (MISCELLANEOUS) ×3 IMPLANT
STAPLER ECHELON BIOABSB 60 FLE (MISCELLANEOUS) ×12 IMPLANT
STAPLER ECHELON LONG 60 440 (INSTRUMENTS) ×3 IMPLANT
STAPLER RELOAD BLUE 60MM (STAPLE) ×9
STAPLER RELOAD GOLD 60MM (STAPLE) ×3
STAPLER RELOAD WHITE 60MM (STAPLE) ×6
STAPLER VISISTAT 35W (STAPLE) ×3 IMPLANT
SUT MNCRL AB 4-0 PS2 18 (SUTURE) ×3 IMPLANT
SUT RELOAD ENDO STITCH 2 48X1 (ENDOMECHANICALS) ×4
SUT RELOAD ENDO STITCH 2.0 (ENDOMECHANICALS) ×7
SUT VIC AB 2-0 SH 27 (SUTURE) ×3
SUT VIC AB 2-0 SH 27X BRD (SUTURE) ×1 IMPLANT
SUT VIC AB 3-0 SH 8-18 (SUTURE) ×3 IMPLANT
SUTURE RELOAD END STTCH 2 48X1 (ENDOMECHANICALS) ×4 IMPLANT
SUTURE RELOAD ENDO STITCH 2.0 (ENDOMECHANICALS) ×7 IMPLANT
SYR 20CC LL (SYRINGE) ×6 IMPLANT
TOWEL OR 17X26 10 PK STRL BLUE (TOWEL DISPOSABLE) ×3 IMPLANT
TOWEL OR NON WOVEN STRL DISP B (DISPOSABLE) ×3 IMPLANT
TRAY FOLEY W/METER SILVER 14FR (SET/KITS/TRAYS/PACK) ×3 IMPLANT
TROCAR ADV FIXATION 12X100MM (TROCAR) ×6 IMPLANT
TROCAR ADV FIXATION 5X100MM (TROCAR) ×6 IMPLANT
TROCAR BLADELESS OPT 5 100 (ENDOMECHANICALS) ×3 IMPLANT
TROCAR XCEL 12X100 BLDLESS (ENDOMECHANICALS) ×3 IMPLANT
TUBING ENDO SMARTCAP PENTAX (MISCELLANEOUS) ×3 IMPLANT
TUBING FILTER THERMOFLATOR (ELECTROSURGICAL) ×3 IMPLANT
YANKAUER SUCT BULB TIP NO VENT (SUCTIONS) ×3 IMPLANT

## 2014-09-02 NOTE — Op Note (Signed)
Jeffrey Molina [id number] Aug 12, 1960 09/02/2014  Preoperative diagnosis: lap gastric bypass in progress  Postoperative diagnosis: Same   Procedure: Upper endoscopy   Surgeon: Susy Frizzle B. Daphine Deutscher  M.D., FACS   Anesthesia: Gen.   Indications for procedure: This patient was undergoing a roux en Y gastric bypass.  Procedure performed to check pouch for leaks and bleeding .    Description of procedure: The endoscopy was placed in the mouth and into the oropharynx and under endoscopic vision it was advanced to the esophagogastric junction.  The pouch was insufflated and examined and appeared to be about 5 cm in length.   No bleeding or leaks were detected.  The scope was withdrawn without difficulty.     Matt B. Daphine Deutscher, MD, FACS General, Bariatric, & Minimally Invasive Surgery [facility], [place]

## 2014-09-02 NOTE — Interval H&P Note (Signed)
History and Physical Interval Note:  09/02/2014 7:12 AM  Jeffrey Molina  has presented today for surgery, with the diagnosis of Morbid Obesity  The various methods of treatment have been discussed with the patient and family. After consideration of risks, benefits and other options for treatment, the patient has consented to  Procedure(s): LAPAROSCOPIC ROUX-EN-Y GASTRIC BYPASS WITH UPPER ENDOSCOPY (N/A) as a surgical intervention .  The patient's history has been reviewed, patient examined, no change in status, stable for surgery.  I have reviewed the patient's chart and labs.  Questions were answered to the patient's satisfaction.     [name]. Andrey Campanile, MD, FACS General, Bariatric, & Minimally Invasive Surgery [facility], [place]

## 2014-09-02 NOTE — Anesthesia Preprocedure Evaluation (Addendum)
Anesthesia Evaluation  Patient identified by MRN, date of birth, ID band Patient awake    Reviewed: Allergy & Precautions, H&P , NPO status , Patient's Chart, lab work & pertinent test results, reviewed documented beta blocker date and time   Airway Mallampati: II  TM Distance: >3 FB Neck ROM: full    Dental no notable dental hx. (+) Teeth Intact, Dental Advisory Given   Pulmonary sleep apnea and Continuous Positive Airway Pressure Ventilation ,  breath sounds clear to auscultation  Pulmonary exam normal       Cardiovascular hypertension, Pt. on home beta blockers Normal cardiovascular examRhythm:regular Rate:Normal  tachycardia   Neuro/Psych negative neurological ROS  negative psych ROS   GI/Hepatic negative GI ROS, Neg liver ROS, GERD-  Medicated and Controlled,  Endo/Other  diabetes, Well Controlled, Type obesity  Renal/GU negative Renal ROS  negative genitourinary   Musculoskeletal   Abdominal   Peds  Hematology negative hematology ROS (+)   Anesthesia Other Findings   Reproductive/Obstetrics negative OB ROS                            Anesthesia Physical Anesthesia Plan  ASA: III  Anesthesia Plan: General   Post-op Pain Management:    Induction: Intravenous  Airway Management Planned: Oral ETT  Additional Equipment:   Intra-op Plan:   Post-operative Plan: Extubation in OR  Informed Consent: I have reviewed the patients History and Physical, chart, labs and discussed the procedure including the risks, benefits and alternatives for the proposed anesthesia with the patient or authorized representative who has indicated his/her understanding and acceptance.   Dental Advisory Given  Plan Discussed with: CRNA and Surgeon  Anesthesia Plan Comments:         Anesthesia Quick Evaluation

## 2014-09-02 NOTE — H&P (View-Only) (Signed)
Jeffrey Molina 08/15/2014 10:15 AM Location: [facility] Patient #: [id number] DOB: 11/07/1960 Married / Language: English / Race: White Male History of Present Illness (Phebe Dettmer M. Duanne Duchesne MD; 08/28/2014 4:39 PM) Patient words: Pre-op Bari.  The patient is a 54 year old male who presents for a preoperative evaluation. He comes in today for his preoperative appointment. He is currently scheduled to undergo laparoscopic Roux-en-Y gastric bypass surgery on June 14. I initially met him in the office on January 7. His weight at that time was 426 pounds. He denies any changes since I initially met him. He denies any trips to the hospital emergency room. He denies any new allergies or medications. His upper GI demonstrated a small hiatal hernia. He states that he has rare reflux. His chest x-ray was within normal limits. His CBC and comprehensive metabolic panel were normal. His LDL lipid level was 123. His hemoglobin A1c was 7.8. He denies any chest pain, chest pressure, shortness of breath, exercise myocardial dyspnea, dyspnea on exertion, TIAs or amaurosis fugax Problem List/Past Medical (Keshona Kartes M Elwyn Klosinski, MD; 08/28/2014 4:41 PM) ARTHRITIS OF BOTH KNEES (716.96  M19.90) Gastroesophageal Reflux Disease ESSENTIAL HYPERTENSION WITH GOAL BLOOD PRESSURE LESS THAN 130/80 (401.9  I10) Hypercholesterolemia OBSTRUCTIVE SLEEP APNEA ON CPAP (327.23  G47.33) OBESITY, MORBID, BMI 50 OR HIGHER (278.01  E66.01) CONTROLLED TYPE 2 DIABETES MELLITUS WITHOUT COMPLICATION, WITHOUT LONG-TERM CURRENT USE OF INSULIN (250.00  E11.9)  Past Surgical History (Adelina Collard M Nasrin Lanzo, MD; 08/28/2014 4:41 PM) Colon Polyp Removal - Colonoscopy  Diagnostic Studies History (Zahari Xiang M Rhiann Boucher, MD; 08/28/2014 4:41 PM) Colonoscopy 1-5 years ago  Allergies (Sara Sakowski, LPN; 08/15/2014 10:16 AM) No Known Drug Allergies 03/27/2014  Medication History (Rocquel Askren M Jadie Comas, MD; 08/28/2014 4:41 PM) Ranitidine HCl (300MG  Tablet, Oral) Active. Sildenafil Citrate (20MG Tablet, Oral) Active. Ibuprofen (400MG Tablet, Oral) Active. Metoprolol Succinate ER (25MG Tablet ER 24HR, Oral) Active. Medications Reconciled OxyCODONE HCl (5MG/5ML Solution, 5-10 Milliliter Oral every four hours, as needed, Taken starting 08/15/2014) Active.  Social History (Asako Saliba M Bula Cavalieri, MD; 08/28/2014 4:41 PM) No drug use Tobacco use Never smoker. Alcohol use Occasional alcohol use. Caffeine use Carbonated beverages.  Family History (Lizbett Garciagarcia M Inita Uram, MD; 08/28/2014 4:41 PM) Arthritis Father. Cancer Mother. Colon Polyps Father. Thyroid problems Daughter.     Review of Systems (Cyndee Giammarco M Kongmeng Santoro, MD; 08/28/2014 4:35 00) General Not Present- Appetite Loss, Chills, Fatigue, Fever, Night Sweats, Weight Gain and Weight Loss. Skin Present- Dryness. Not Present- Change in Wart/Mole, Hives, Jaundice, New Lesions, Non-Healing Wounds, Rash and Ulcer. HEENT Present- Wears glasses/contact lenses. Not Present- Earache, Hearing Loss, Hoarseness, Nose Bleed, Oral Ulcers, Ringing in the Ears, Seasonal Allergies, Sinus Pain, Sore Throat, Visual Disturbances and Yellow Eyes. Respiratory Present- Chronic Cough and Snoring. Not Present- Bloody sputum, Difficulty Breathing and Wheezing. Breast Not Present- Breast Mass, Breast Pain, Nipple Discharge and Skin Changes. Cardiovascular Present- Swelling of Extremities. Not Present- Chest Pain, Difficulty Breathing Lying Down, Leg Cramps, Palpitations, Rapid Heart Rate and Shortness of Breath. Gastrointestinal Not Present- Abdominal Pain, Bloating, Bloody Stool, Change in Bowel Habits, Chronic diarrhea, Constipation, Difficulty Swallowing, Excessive gas, Gets full quickly at meals, Hemorrhoids, Indigestion, Nausea, Rectal Pain and Vomiting. Male Genitourinary Present- Frequency and Impotence. Not Present- Blood in Urine, Change in Urinary Stream, Nocturia, Painful Urination, Urgency and Urine  Leakage. Musculoskeletal Present- Joint Pain. Not Present- Back Pain, Joint Stiffness, Muscle Pain, Muscle Weakness and Swelling of Extremities. Neurological Present- Trouble walking. Not Present- Decreased Memory, Fainting, Headaches, Numbness, Seizures,   Tingling, Tremor and Weakness. Psychiatric Not Present- Anxiety, Bipolar, Change in Sleep Pattern, Depression, Fearful and Frequent crying. Endocrine Present- New Diabetes. Not Present- Cold Intolerance, Excessive Hunger, Hair Changes and Heat Intolerance. Hematology Not Present- Easy Bruising, Excessive bleeding, Gland problems, HIV and Persistent Infections.  Vitals (Sara Sakowski LPN; 08/15/2014 10:14 AM) 08/15/2014 10:13 AM Weight: 436.38 lb Height: 73in Body Surface Area: 3.19 m Body Mass Index: 57.57 kg/m Temp.: 98.4F(Oral)  Pulse: 80 (Regular)  BP: 152/78 (Sitting, Left Arm, Standard)     Physical Exam (Zakiyah Diop M. Issabela Lesko MD; 08/28/2014 4:35 PM)  General Mental Status-Alert. General Appearance-Consistent with stated age. Hydration-Well hydrated. Voice-Normal. Note: morbidly obese; evenly distributed   Head and Neck Head-normocephalic, atraumatic with no lesions or palpable masses. Trachea-midline. Thyroid Gland Characteristics - normal size and consistency.  Eye Eyeball - Bilateral-Extraocular movements intact. Sclera/Conjunctiva - Bilateral-No scleral icterus.  Chest and Lung Exam Chest and lung exam reveals -quiet, even and easy respiratory effort with no use of accessory muscles and on auscultation, normal breath sounds, no adventitious sounds and normal vocal resonance. Inspection Chest Wall - Normal. Back - normal.  Breast - Did not examine.  Cardiovascular Cardiovascular examination reveals -normal heart sounds, regular rate and rhythm with no murmurs and normal pedal pulses bilaterally.  Abdomen Inspection Inspection of the abdomen reveals - No Hernias. Skin - Scar - no  surgical scars. Palpation/Percussion Palpation and Percussion of the abdomen reveal - Soft, Non Tender, No Rebound tenderness, No Rigidity (guarding) and No hepatosplenomegaly. Auscultation Auscultation of the abdomen reveals - Bowel sounds normal.  Peripheral Vascular Upper Extremity Palpation - Pulses bilaterally normal.  Neurologic Neurologic evaluation reveals -alert and oriented x 3 with no impairment of recent or remote memory. Mental Status-Normal.  Neuropsychiatric The patient's mood and affect are described as -normal. Judgment and Insight-insight is appropriate concerning matters relevant to self.  Musculoskeletal Normal Exam - Left-Upper Extremity Strength Normal and Lower Extremity Strength Normal. Normal Exam - Right-Upper Extremity Strength Normal and Lower Extremity Strength Normal. Note: some crepitus L knee   Lymphatic Head & Neck  General Head & Neck Lymphatics: Bilateral - Description - Normal. Axillary - Did not examine. Femoral & Inguinal - Did not examine. Note: some brawny skin on LLE; less extent on RLE     Assessment & Plan (Alwaleed Obeso M. Kahlan Engebretson MD; 08/28/2014 4:41 PM)  OBESITY, MORBID, BMI 50 OR HIGHER (278.01  E66.01) Impression: We discussed his preoperative workup. We discussed the results of his upper GI and blood work. We discussed the importance of the preoperative diet. Highly encouraged him to be as compliant as possible with the preoperative diet in order to loosen additional weight as well as to shrink his liver to make surgery technically safer and easier. We discussed the typical operative course as well as postoperative course. He was given his bowel prep instructions today. He was given his postoperative pain medicine prescription today. I encouraged him to contact the office should he have any additional questions between now and surgery  Current Plans Pt Education - EMW_preopbariatric Started OxyCODONE HCl 5MG/5ML, 5-10  Milliliter every four hours, as needed, 200 Milliliter, 08/15/2014, No Refill. OBSTRUCTIVE SLEEP APNEA ON CPAP (327.23  G47.33)  ARTHRITIS OF BOTH KNEES (716.96  M19.90)  ESSENTIAL HYPERTENSION WITH GOAL BLOOD PRESSURE LESS THAN 130/80 (401.9  I10)  CONTROLLED TYPE 2 DIABETES MELLITUS WITHOUT COMPLICATION, WITHOUT LONG-TERM CURRENT USE OF INSULIN (250.00  E11.9)  Craige Patel M. Dawnielle Christiana, MD, FACS General, Bariatric, & Minimally Invasive Surgery [facility] [place]  

## 2014-09-02 NOTE — Op Note (Signed)
Jeffrey Molina [id number] Aug 07, 1960. 09/02/2014  Preoperative diagnosis:  1. Morbid obesity (BMI 54, wt 411lb)    HTN (hypertension)   DJD (degenerative joint disease) of knee   Hyperlipidemia   OSA on CPAP   Diabetes mellitus type 2 in obese  Postoperative  diagnosis:  1. same  Surgical procedure: Laparoscopic Roux-en-Y gastric bypass (ante-colic, ante-gastric); upper endoscopy  Surgeon: Atilano Ina, M.D. FACS  Asst.: Luretha Murphy MD FACS  Anesthesia: General plus 70cc exparel  Complications: None   EBL: Minimal   Drains: None   Disposition: PACU in good condition   Indications for procedure: 54yo WM with morbid obesity who has been unsuccessful at sustained weight loss. The patient's comorbidities are listed above. We discussed the risk and benefits of surgery including but not limited to anesthesia risk, bleeding, infection, blood clot formation, anastomotic leak, anastomotic stricture, ulcer formation, death, respiratory complications, intestinal blockage, internal hernia, gallstone formation, vitamin and nutritional deficiencies, injury to surrounding structures, failure to lose weight and mood changes.   Description of procedure: Patient is brought to the operating room and general anesthesia induced. The patient had received preoperative broad-spectrum IV antibiotics and subcutaneous heparin. The abdomen was widely sterilely prepped with Chloraprep and draped. Patient timeout was performed and correct patient and procedure confirmed. Access was obtained with a 12 mm Optiview trocar in the left upper quadrant and pneumoperitoneum established without difficulty. Under direct vision 12 mm trocars were placed laterally in the right upper quadrant, right upper quadrant midclavicular line, and to the left and above the umbilicus for the camera port. A 5 mm trocar was placed laterally in the left upper quadrant.  The omentum was brought into the upper abdomen and the transverse  mesocolon elevated and the ligament of Treitz clearly identified. A 40 cm biliopancreatic limb was then carefully measured from the ligament of Treitz. The small intestine was divided at this point with a single firing of the white load linear stapler. A Penrose drain was sutured to the end of the Roux-en-Y limb for later identification. A 100 cm Roux-en-Y limb was then carefully measured. At this point a side-to-side anastomosis was created between the Roux limb and the end of the biliopancreatic limb. This was accomplished with a single firing of the 60 mm white load linear stapler. The common enterotomy was closed with a running 2-0 Vicryl begun at either end of the enterotomy and tied centrally. Tisseel tissue sealant was placed over the anastomosis. The mesenteric defect was then closed with running 2-0 silk. The omentum was then divided with the harmonic scalpel up towards the transverse colon to allow mobility of the Roux limb toward the gastric pouch. The patient was then placed in steep reversed Trendelenburg. Through a 5 mm subxiphoid site the [facility] retractor was placed and the left lobe of the liver elevated with excellent exposure of the upper stomach and hiatus. The angle of Hiss was then mobilized with the harmonic scalpel. A 4 cm gastric pouch was then carefully measured along the lesser curve of the stomach. Dissection was carried along the lesser curve at this point with the Harmonic scalpel working carefully back toward the lesser sac at right angles to the lesser curve. The free lesser sac was then entered. After being sure all tubes were removed from the stomach an initial firing of the gold load 60 mm linear stapler was fired at right angles across the lesser curve for about 4 cm. The gastric pouch was further mobilized posteriorly and then  the pouch was completed with 3 further firings of the 60 mm blue load linear stapler (last 2 firings with seamguard) up through the previously dissected  angle of His. It was ensured that the pouch was completely mobilized away from the gastric remnant. This created a nice tubular 4-5 cm gastric pouch. The Roux limb was then brought up in an antecolic fashion with the candycane facing to the patient's left without undue tension. The gastrojejunostomy was created with an initial posterior row of 2-0 Vicryl between the Roux limb and the staple line of the gastric pouch. Enterotomies were then made in the gastric pouch and the Roux limb with the harmonic scalpel and at approximately 2-2-1/2 cm anastomosis was created with a single firing of the 45mm blue load linear stapler. The staple line was inspected and was intact without bleeding. The common enterotomy was then closed with running 2-0 Vicryl begun at either end and tied centrally. The Ewall tube was then easily passed through the anastomosis and an outer anterior layer of running 2-0 Vicryl was placed. The Ewald tube was removed. With the outlet of the gastrojejunostomy clamped and under saline irrigation the assistant performed upper endoscopy and with the gastric pouch tensely distended with air-there was no evidence of leak on this test. The pouch was desufflated. The Vonita Moss defect was closed with running 2-0 silk. The abdomen was inspected for any evidence of bleeding or bowel injury and everything looked fine. The Nathanson retractor was removed under direct vision after coating the anastomosis with Tisseel tissue sealant. All CO2 was evacuated and trochars removed. Skin incisions were closed with 4-0 monocryl in a subcuticular fashion followed by Dermabond. Sponge needle and instrument counts were correct. The patient was taken to the PACU in good condition.    [name]. Andrey Campanile, MD, FACS General, Bariatric, & Minimally Invasive Surgery [facility], [place]

## 2014-09-02 NOTE — Anesthesia Postprocedure Evaluation (Signed)
  Anesthesia Post-op Note  Patient: Jeffrey Molina  Procedure(s) Performed: Procedure(s) (LRB): LAPAROSCOPIC ROUX-EN-Y GASTRIC BYPASS WITH UPPER ENDOSCOPY (N/A)  Patient Location: PACU  Anesthesia Type: General  Level of Consciousness: awake and alert   Airway and Oxygen Therapy: Patient Spontanous Breathing  Post-op Pain: mild  Post-op Assessment: Post-op Vital signs reviewed, Patient's Cardiovascular Status Stable, Respiratory Function Stable, Patent Airway and No signs of Nausea or vomiting  Last Vitals:  Filed Vitals:   09/02/14 1535  BP: 141/83  Pulse: 88  Temp: 36.6 C  Resp: 22    Post-op Vital Signs: stable   Complications: No apparent anesthesia complications

## 2014-09-02 NOTE — Progress Notes (Signed)
Pt placed himself on his home CPAP with 2lpm [place]/PAC pump. Vitals stable no distress noted at this time.

## 2014-09-02 NOTE — Transfer of Care (Signed)
Immediate Anesthesia Transfer of Care Note  Patient: Jeffrey Molina  Procedure(s) Performed: Procedure(s): LAPAROSCOPIC ROUX-EN-Y GASTRIC BYPASS WITH UPPER ENDOSCOPY (N/A)  Patient Location: PACU  Anesthesia Type:General  Level of Consciousness: awake, sedated, patient cooperative and responds to stimulation  Airway & Oxygen Therapy: Patient Spontanous Breathing and Patient connected to face mask oxygen  Post-op Assessment: Report given to RN and Post -op Vital signs reviewed and stable  Post vital signs: Reviewed and stable  Last Vitals:  Filed Vitals:   09/02/14 0515  BP: 166/92  Pulse: 90  Temp: 37.3 C  Resp: 18    Complications: No apparent anesthesia complications

## 2014-09-02 NOTE — Anesthesia Procedure Notes (Signed)
Procedure Name: Intubation Date/Time: 09/02/2014 7:27 AM Performed by: Leroy Libman L Patient Re-evaluated:Patient Re-evaluated prior to inductionOxygen Delivery Method: Circle system utilized Preoxygenation: Pre-oxygenation with 100% oxygen Intubation Type: IV induction Ventilation: Mask ventilation without difficulty and Oral airway inserted - appropriate to patient size Grade View: Grade I Tube type: Oral Tube size: 8.0 mm Number of attempts: 2 Airway Equipment and Method: Video-laryngoscopy and Rigid stylet Placement Confirmation: ETT inserted through vocal cords under direct vision,  breath sounds checked- equal and bilateral and positive ETCO2 Secured at: 22 cm Tube secured with: Tape Dental Injury: Teeth and Oropharynx as per pre-operative assessment  Comments: Attempted Direct Laryngoscopy with Miller 3,  Grade 3,  intubated esophagus. Easy mask ventilation Glidescope grade 1 view.

## 2014-09-02 NOTE — Progress Notes (Signed)
RT in to assess for home CPAP use. Pt alert and oriented, stable on 3L. Pt found on ETCO2 monitor. Placed on home unit, SpO2 monitored, assessed to be stable above 96%. RN aware. Pt aware to call if assistance needed. VSS and NAD noted.

## 2014-09-03 ENCOUNTER — Encounter (HOSPITAL_COMMUNITY): Payer: Self-pay | Admitting: General Surgery

## 2014-09-03 ENCOUNTER — Inpatient Hospital Stay (HOSPITAL_COMMUNITY): Payer: BLUE CROSS/BLUE SHIELD

## 2014-09-03 LAB — CBC WITH DIFFERENTIAL/PLATELET
BASOS ABS: 0 10*3/uL (ref 0.0–0.1)
BASOS PCT: 0 % (ref 0–1)
EOS ABS: 0 10*3/uL (ref 0.0–0.7)
Eosinophils Relative: 0 % (ref 0–5)
HEMATOCRIT: 41.2 % (ref 39.0–52.0)
Hemoglobin: 12.8 g/dL — ABNORMAL LOW (ref 13.0–17.0)
LYMPHS ABS: 1.1 10*3/uL (ref 0.7–4.0)
Lymphocytes Relative: 9 % — ABNORMAL LOW (ref 12–46)
MCH: 28.3 pg (ref 26.0–34.0)
MCHC: 31.1 g/dL (ref 30.0–36.0)
MCV: 91.2 fL (ref 78.0–100.0)
Monocytes Absolute: 1.4 10*3/uL — ABNORMAL HIGH (ref 0.1–1.0)
Monocytes Relative: 12 % (ref 3–12)
Neutro Abs: 9.1 10*3/uL — ABNORMAL HIGH (ref 1.7–7.7)
Neutrophils Relative %: 79 % — ABNORMAL HIGH (ref 43–77)
Platelets: 227 10*3/uL (ref 150–400)
RBC: 4.52 MIL/uL (ref 4.22–5.81)
RDW: 13.9 % (ref 11.5–15.5)
WBC: 11.6 10*3/uL — ABNORMAL HIGH (ref 4.0–10.5)

## 2014-09-03 LAB — COMPREHENSIVE METABOLIC PANEL
ALBUMIN: 3.4 g/dL — AB (ref 3.5–5.0)
ALT: 95 U/L — AB (ref 17–63)
AST: 78 U/L — AB (ref 15–41)
Alkaline Phosphatase: 33 U/L — ABNORMAL LOW (ref 38–126)
Anion gap: 10 (ref 5–15)
BILIRUBIN TOTAL: 0.4 mg/dL (ref 0.3–1.2)
BUN: 9 mg/dL (ref 6–20)
CHLORIDE: 99 mmol/L — AB (ref 101–111)
CO2: 28 mmol/L (ref 22–32)
Calcium: 8.6 mg/dL — ABNORMAL LOW (ref 8.9–10.3)
Creatinine, Ser: 1.03 mg/dL (ref 0.61–1.24)
GFR calc Af Amer: >60 mL/min (ref >60)
Glucose, Bld: 114 mg/dL — ABNORMAL HIGH (ref 65–99)
Potassium: 4.1 mmol/L (ref 3.5–5.1)
SODIUM: 137 mmol/L (ref 135–145)
Total Protein: 6.4 g/dL — ABNORMAL LOW (ref 6.5–8.1)

## 2014-09-03 LAB — HEMOGLOBIN AND HEMATOCRIT, BLOOD
HEMATOCRIT: 40.6 % (ref 39.0–52.0)
HEMOGLOBIN: 12.9 g/dL — AB (ref 13.0–17.0)

## 2014-09-03 MED ORDER — ENOXAPARIN (LOVENOX) PATIENT EDUCATION KIT
PACK | Freq: Once | Status: AC
  Administered 2014-09-04: 09:00:00
  Filled 2014-09-03: qty 1

## 2014-09-03 MED ORDER — ENOXAPARIN (LOVENOX) PATIENT EDUCATION KIT
PACK | Freq: Once | Status: AC
  Filled 2014-09-03: qty 1

## 2014-09-03 MED ORDER — IOHEXOL 300 MG/ML  SOLN
50.0000 mL | Freq: Once | INTRAMUSCULAR | Status: AC | PRN
  Administered 2014-09-03: 50 mL via ORAL

## 2014-09-03 NOTE — Progress Notes (Signed)
Patient alert and oriented, Post op day 1.  Provided support and encouragement.  Encouraged pulmonary toilet, ambulation and small sips of liquids when swallow study returned satisfactorily.  All questions answered.  Will continue to monitor. 

## 2014-09-03 NOTE — Progress Notes (Signed)
1 Day Post-Op  Subjective: Didn't sleep well. No n/v. Pain ok.   Objective: Vital signs in last 24 hours: Temp:  [97.6 F (36.4 C)-98.8 F (37.1 C)] 97.6 F (36.4 C) (06/15 0517) Pulse Rate:  [62-93] 66 (06/15 0517) Resp:  [13-23] 20 (06/15 0517) BP: (120-162)/(48-84) 139/77 mmHg (06/15 0517) SpO2:  [91 %-100 %] 98 % (06/15 0517) Last BM Date: 09/02/14  Intake/Output from previous day: 06/14 0701 - 06/15 0700 In: 4349.2 [P.O.:120; I.V.:4229.2] Out: 1525 [Urine:1475; Blood:50] Intake/Output this shift:    Alert, nad cta b/l Reg Soft, min TTP, incisions c/d/i +SCDs  Lab Results:   Recent Labs  09/02/14 1117 09/03/14 0456  WBC  --  11.6*  HGB 13.6 12.8*  HCT 42.2 41.2  PLT  --  227   BMET  Recent Labs  09/03/14 0456  NA 137  K 4.1  CL 99*  CO2 28  GLUCOSE 114*  BUN 9  CREATININE 1.03  CALCIUM 8.6*   PT/INR No results for input(s): LABPROT, INR in the last 72 hours. ABG No results for input(s): PHART, HCO3 in the last 72 hours.  Invalid input(s): PCO2, PO2  Studies/Results: No results found.  Anti-infectives: Anti-infectives    Start     Dose/Rate Route Frequency Ordered Stop   09/02/14 0516  cefoTEtan in Dextrose 5% (CEFOTAN) IVPB 2 g     2 g Intravenous On call to O.R. 09/02/14 0516 09/02/14 0732      Assessment/Plan: s/p Procedure(s): LAPAROSCOPIC ROUX-EN-Y GASTRIC BYPASS WITH UPPER ENDOSCOPY (N/A)  Held bBlocker bc I wanted to monitor for tachycardia after surgery.  For UGI this am. If ok will start POD 1 diet OOB, pulm toilet, IS Cont chemical VTE prophylaxis Will plan on sending home on extended chemical VTE prophylaxis due to weight - start teaching, case manager Cont CPAP at night  [name]. Andrey Campanile, MD, FACS General, Bariatric, & Minimally Invasive Surgery [facility], [place]   LOS: 1 day    Atilano Ina 09/03/2014

## 2014-09-03 NOTE — Care Management Note (Addendum)
Case Management Note  Patient Details  Name: Justinpaul Salone MRN: [id number] Date of Birth: 1960/04/14  Subjective/Objective:            S/p Laparoscopic Roux-en-Y gastric bypass         Action/Plan: Discharge planning. Lovenox benefits, $17 for Generic Lovenox for 30 day supply. Contacted [facility] in [place] and they have an adequate supply.    Expected Discharge Date:  09/04/14               Expected Discharge Plan:  Home/Self Care  In-House Referral:  NA  Discharge planning Services  CM Consult  Post Acute Care Choice:    Choice offered to:     DME Arranged:    DME Agency:     HH Arranged:    HH Agency:     Status of Service:  Completed, signed off  Medicare Important Message Given:  N/A - LOS <3 / Initial given by admissions Date Medicare IM Given:    Medicare IM give by:    Date Additional Medicare IM Given:    Additional Medicare Important Message give by:     If discussed at Long Length of Stay Meetings, dates discussed:    Additional Comments:  Alexis Goodell, RN 09/03/2014, 7:51 AM

## 2014-09-03 NOTE — Plan of Care (Signed)
Problem: Food- and Nutrition-Related Knowledge Deficit (NB-1.1) Goal: Nutrition education Formal process to instruct or train a patient/client in a skill or to impart knowledge to help patients/clients voluntarily manage or modify food choices and eating behavior to maintain or improve health. Outcome: Completed/Met Date Met:  09/03/14 Nutrition Education Note  Received consult for diet education per DROP protocol.   Discussed 2 week post op diet with pt. Emphasized that liquids must be non carbonated, non caffeinated, and sugar free. Fluid goals discussed. Pt to follow up with outpatient bariatric RD for further diet progression after 2 weeks. Multivitamins and minerals also reviewed. Teach back method used, pt expressed understanding, expect good compliance.   Diet: First 2 Weeks  You will see the nutritionist about two (2) weeks after your surgery. The nutritionist will increase the types of foods you can eat if you are handling liquids well:  If you have severe vomiting or nausea and cannot handle clear liquids lasting longer than 1 day, call your surgeon  Protein Shake  Drink at least 2 ounces of shake 5-6 times per day  Each serving of protein shakes (usually 8 - 12 ounces) should have a minimum of:  15 grams of protein  And no more than 5 grams of carbohydrate  Goal for protein each day:  Men = 80 grams per day  Women = 60 grams per day  Protein powder may be added to fluids such as non-fat milk or Lactaid milk or Soy milk (limit to 35 grams added protein powder per serving)   Hydration  Slowly increase the amount of water and other clear liquids as tolerated (See Acceptable Fluids)  Slowly increase the amount of protein shake as tolerated  Sip fluids slowly and throughout the day  May use sugar substitutes in small amounts (no more than 6 - 8 packets per day; i.e. Splenda)   Fluid Goal  The first goal is to drink at least 8 ounces of protein shake/drink per day (or as directed  by the nutritionist); some examples of protein shakes are [employer], [employer], EAS Edge HP, and Unjury. See handout from pre-op Bariatric Education Class:  Slowly increase the amount of protein shake you drink as tolerated  You may find it easier to slowly sip shakes throughout the day  It is important to get your proteins in first  Your fluid goal is to drink 64 - 100 ounces of fluid daily  It may take a few weeks to build up to this  32 oz (or more) should be clear liquids  And  32 oz (or more) should be full liquids (see below for examples)  Liquids should not contain sugar, caffeine, or carbonation   Clear Liquids:  Water or Sugar-free flavored water (i.e. Fruit H2O, Propel)  Decaffeinated coffee or tea (sugar-free)  Crystal Lite, Wyler's Lite, Minute Maid Lite  Sugar-free Jell-O  Bouillon or broth  Sugar-free Popsicle: *Less than 20 calories each; Limit 1 per day   Full Liquids:  Protein Shakes/Drinks + 2 choices per day of other full liquids  Full liquids must be:  No More Than 12 grams of Carbs per serving  No More Than 3 grams of Fat per serving  Strained low-fat cream soup  Non-Fat milk  Fat-free Lactaid Milk  Sugar-free yogurt (Dannon Lite & Fit, Greek yogurt)     Jeffrey Bibles, MS, RD, LDN Pager: [phone] After Hours Pager: [phone]

## 2014-09-04 LAB — COMPREHENSIVE METABOLIC PANEL
ALBUMIN: 3.4 g/dL — AB (ref 3.5–5.0)
ALK PHOS: 33 U/L — AB (ref 38–126)
ALT: 110 U/L — AB (ref 17–63)
AST: 59 U/L — ABNORMAL HIGH (ref 15–41)
Anion gap: 10 (ref 5–15)
BUN: 7 mg/dL (ref 6–20)
CALCIUM: 8.6 mg/dL — AB (ref 8.9–10.3)
CO2: 26 mmol/L (ref 22–32)
Chloride: 102 mmol/L (ref 101–111)
Creatinine, Ser: 0.84 mg/dL (ref 0.61–1.24)
GFR calc Af Amer: >60 mL/min (ref >60)
GFR calc non Af Amer: >60 mL/min (ref >60)
Glucose, Bld: 109 mg/dL — ABNORMAL HIGH (ref 65–99)
Potassium: 3.8 mmol/L (ref 3.5–5.1)
SODIUM: 138 mmol/L (ref 135–145)
TOTAL PROTEIN: 6.5 g/dL (ref 6.5–8.1)
Total Bilirubin: 1 mg/dL (ref 0.3–1.2)

## 2014-09-04 LAB — CBC WITH DIFFERENTIAL/PLATELET
BASOS ABS: 0 10*3/uL (ref 0.0–0.1)
Basophils Relative: 0 % (ref 0–1)
EOS PCT: 1 % (ref 0–5)
Eosinophils Absolute: 0.1 10*3/uL (ref 0.0–0.7)
HCT: 40.3 % (ref 39.0–52.0)
Hemoglobin: 12.8 g/dL — ABNORMAL LOW (ref 13.0–17.0)
Lymphocytes Relative: 13 % (ref 12–46)
Lymphs Abs: 1.1 10*3/uL (ref 0.7–4.0)
MCH: 28.8 pg (ref 26.0–34.0)
MCHC: 31.8 g/dL (ref 30.0–36.0)
MCV: 90.8 fL (ref 78.0–100.0)
Monocytes Absolute: 1.2 10*3/uL — ABNORMAL HIGH (ref 0.1–1.0)
Monocytes Relative: 14 % — ABNORMAL HIGH (ref 3–12)
Neutro Abs: 5.8 10*3/uL (ref 1.7–7.7)
Neutrophils Relative %: 72 % (ref 43–77)
Platelets: 211 10*3/uL (ref 150–400)
RBC: 4.44 MIL/uL (ref 4.22–5.81)
RDW: 14 % (ref 11.5–15.5)
WBC: 8.1 10*3/uL (ref 4.0–10.5)

## 2014-09-04 MED ORDER — OXYCODONE HCL 5 MG/5ML PO SOLN: 5.0000 mg | ORAL | Status: DC | PRN

## 2014-09-04 MED ORDER — ONDANSETRON 4 MG PO TBDP: 4.0000 mg | ORAL_TABLET | Freq: Three times a day (TID) | ORAL | Status: DC | PRN

## 2014-09-04 MED ORDER — ENOXAPARIN SODIUM 40 MG/0.4ML ~~LOC~~ SOLN: 40.0000 mg | SUBCUTANEOUS | Status: DC

## 2014-09-04 MED ORDER — PANTOPRAZOLE SODIUM 40 MG PO TBEC: 40.0000 mg | DELAYED_RELEASE_TABLET | Freq: Every day | ORAL | Status: DC

## 2014-09-04 MED ORDER — METOPROLOL TARTRATE 25 MG PO TABS: 12.5000 mg | ORAL_TABLET | Freq: Two times a day (BID) | ORAL | Status: DC

## 2014-09-04 NOTE — Discharge Instructions (Signed)

## 2014-09-04 NOTE — Progress Notes (Signed)
2 Days Post-Op  Subjective: Doing well. No n/v. Hasn't really taken any pain meds. Tolerated water. Ambulated 5x yesterday  Objective: Vital signs in last 24 hours: Temp:  [97.9 F (36.6 C)-99.2 F (37.3 C)] 98 F (36.7 C) (06/16 0516) Pulse Rate:  [68-84] 68 (06/16 0516) Resp:  [18-21] 18 (06/16 0516) BP: (142-163)/(75-84) 142/81 mmHg (06/16 0516) SpO2:  [98 %-100 %] 99 % (06/16 0516) Last BM Date: 09/02/14  Intake/Output from previous day: 06/15 0701 - 06/16 0700 In: 3170 [P.O.:120; I.V.:3000; IV Piggyback:50] Out: 275 [Urine:275] Intake/Output this shift:    Alert, nad cta b/l Reg Obese, soft, min TTP, c/d/i No edema  Lab Results:   Recent Labs  09/03/14 0456 09/03/14 1630 09/04/14 0420  WBC 11.6*  --  8.1  HGB 12.8* 12.9* 12.8*  HCT 41.2 40.6 40.3  PLT 227  --  211   BMET  Recent Labs  09/03/14 0456 09/04/14 0420  NA 137 138  K 4.1 3.8  CL 99* 102  CO2 28 26  GLUCOSE 114* 109*  BUN 9 7  CREATININE 1.03 0.84  CALCIUM 8.6* 8.6*   Hepatic Function Latest Ref Rng 09/04/2014 09/03/2014 08/29/2014  Total Protein 6.5 - 8.1 g/dL 6.5 6.4(L) 7.1  Albumin 3.5 - 5.0 g/dL 3.4(L) 3.4(L) 4.0  AST 15 - 41 U/L 59(H) 78(H) 31  ALT 17 - 63 U/L 110(H) 95(H) 37  Alk Phosphatase 38 - 126 U/L 33(L) 33(L) 37(L)  Total Bilirubin 0.3 - 1.2 mg/dL 1.0 0.4 0.6  Bilirubin, Direct 0.0 - 0.3 mg/dL - - -     PT/INR No results for input(s): LABPROT, INR in the last 72 hours. ABG No results for input(s): PHART, HCO3 in the last 72 hours.  Invalid input(s): PCO2, PO2  Studies/Results: Dg Ugi W/water Sol Cm  09/03/2014   CLINICAL DATA:  Postop gastric bypass yesterday.  EXAM: WATER SOLUBLE UPPER GI SERIES  TECHNIQUE: Single-column upper GI series was performed using water soluble contrast.  CONTRAST:  50 mL Omnipaque 300 p.o.  COMPARISON:  04/21/2014  FLUOROSCOPY TIME:  Radiation Exposure Index (as provided by the fluoroscopic device):  If the device does not provide the  exposure index:  Fluoroscopy Time (in minutes and seconds):  35 seconds  Number of Acquired Images:  FINDINGS: Patient swallowed Omnipaque contrast without difficulty. Distal esophagus normal. Gastric pouch is normal and empties readily into the Roux-en-Y. Small bowel is nondilated. No leak identified.  IMPRESSION: Satisfactory upper GI post gastric bypass with Roux-en-Y   Electronically Signed   By: Franchot Gallo M.D.   On: 09/03/2014 10:19    Anti-infectives: Anti-infectives    Start     Dose/Rate Route Frequency Ordered Stop   09/02/14 0516  cefoTEtan in Dextrose 5% (CEFOTAN) IVPB 2 g     2 g Intravenous On call to O.R. 09/02/14 0516 09/02/14 0732      Assessment/Plan: s/p Procedure(s): LAPAROSCOPIC ROUX-EN-Y GASTRIC BYPASS WITH UPPER ENDOSCOPY (N/A)  Looks good.  Adv to pod 2 diet Anticipate dc around lunch time Discussed dc instructions Will need to change his home Toprol dose Discussed need for outpt lovenox for 2 weeks  Leighton Ruff. Redmond Pulling, MD, FACS General, Bariatric, & Minimally Invasive Surgery [facility], [place]    LOS: 2 days    Gayland Curry 09/04/2014

## 2014-09-04 NOTE — Discharge Summary (Signed)
Physician Discharge Summary  Jeffrey Molina ZOX:[id number] DOB: [birth year] DOA: 09/02/2014  PCP: Danise Edge, MD  Admit date: 09/02/2014 Discharge date: 09/04/2014  Recommendations for Outpatient Follow-up:   Follow-up Information    Follow up with Atilano Ina, MD. Go on 09/18/2014.   Specialty:  General Surgery   Why:  For Post-Op Check at 9:30   Contact information:   [address] [place] [address] [phone]      Discharge Diagnoses:  Principal Problem:   Morbid obesity Active Problems:   HTN (hypertension)   DJD (degenerative joint disease) of knee   Hyperlipidemia   OSA on CPAP   Diabetes mellitus type 2 in obese   S/P gastric bypass   Surgical Procedure: Laparoscopic Roux-en-Y gastric bypass, upper endoscopy  Discharge Condition: Good Disposition: Home  Diet recommendation: Postoperative gastric bypass diet  Filed Weights   09/02/14 0515  Weight: 186.428 kg (411 lb)     Hospital Course:  The patient was admitted for a planned laparoscopic Roux-en-Y gastric bypass. Please see operative note. Preoperatively the patient was given 5000 units of subcutaneous heparin for DVT prophylaxis. Postoperative prophylactic Lovenox dosing was started on the morning of postoperative day 1. The patient underwent an upper GI on postoperative day 1 which demonstrated no extravasation of contrast and emptying of the contrast into the Roux limb. The patient was started on ice chips and water which they tolerated. On postoperative day 2 The patient's diet was advanced to protein shakes which they also tolerated. The patient was ambulating without difficulty. Their vital signs are stable without fever or tachycardia. Their hemoglobin had remained stable. The patient was maintained on their home settings for CPAP therapy. The patient had received discharge instructions and counseling. They were deemed stable for discharge.  He was sent home on extended VTE prophylaxis  for 2 weeks. We also changed his Toprol XL due to his gastric bypass  Discharge Instructions     Medication List    STOP taking these medications        doxycycline 100 MG capsule  Commonly known as:  VIBRAMYCIN     ibuprofen 400 MG tablet  Commonly known as:  ADVIL,MOTRIN     metoprolol succinate 25 MG 24 hr tablet  Commonly known as:  TOPROL-XL     sildenafil 20 MG tablet  Commonly known as:  REVATIO     triamcinolone cream 0.1 %  Commonly known as:  KENALOG      TAKE these medications        B-12 5000 MCG Tbdp  Take 1 tablet by mouth daily.     CALCIUM 600 + D PO  Take 1 tablet by mouth 3 (three) times daily.     docusate sodium 100 MG capsule  Commonly known as:  COLACE  Take 100 mg by mouth 2 (two) times daily.     enoxaparin 40 MG/0.4ML injection  Commonly known as:  LOVENOX  Inject 0.4 mLs (40 mg total) into the skin daily.     metoprolol tartrate 25 MG tablet  Commonly known as:  LOPRESSOR  Take 0.5 tablets (12.5 mg total) by mouth 2 (two) times daily.     multivitamin with minerals Tabs tablet  Take 1 tablet by mouth 2 (two) times daily.     mupirocin ointment 2 %  Commonly known as:  BACTROBAN  Place 1 application into the nose 2 (two) times daily.     ondansetron 4 MG disintegrating tablet  Commonly  known as:  ZOFRAN ODT  Take 1 tablet (4 mg total) by mouth every 8 (eight) hours as needed for nausea or vomiting.     oxyCODONE 5 MG/5ML solution  Commonly known as:  ROXICODONE  Take 5-10 mLs (5-10 mg total) by mouth every 4 (four) hours as needed for moderate pain or severe pain.     pantoprazole 40 MG tablet  Commonly known as:  PROTONIX  Take 1 tablet (40 mg total) by mouth daily.           Follow-up Information    Follow up with Atilano Ina, MD. Go on 09/18/2014.   Specialty:  General Surgery   Why:  For Post-Op Check at 9:30   Contact information:   [address] [place] [address] [phone]        The  results of significant diagnostics from this hospitalization (including imaging, microbiology, ancillary and laboratory) are listed below for reference.    Significant Diagnostic Studies: Dg Ugi W/water Sol Cm  09/03/2014   CLINICAL DATA:  Postop gastric bypass yesterday.  EXAM: WATER SOLUBLE UPPER GI SERIES  TECHNIQUE: Single-column upper GI series was performed using water soluble contrast.  CONTRAST:  50 mL Omnipaque 300 p.o.  COMPARISON:  04/21/2014  FLUOROSCOPY TIME:  Radiation Exposure Index (as provided by the fluoroscopic device):  If the device does not provide the exposure index:  Fluoroscopy Time (in minutes and seconds):  35 seconds  Number of Acquired Images:  FINDINGS: Patient swallowed Omnipaque contrast without difficulty. Distal esophagus normal. Gastric pouch is normal and empties readily into the Roux-en-Y. Small bowel is nondilated. No leak identified.  IMPRESSION: Satisfactory upper GI post gastric bypass with Roux-en-Y   Electronically Signed   By: Marlan Palau M.D.   On: 09/03/2014 10:19    Labs: Basic Metabolic Panel:  Recent Labs Lab 08/29/14 0830 09/03/14 0456 09/04/14 0420  NA 137 137 138  K 4.0 4.1 3.8  CL 102 99* 102  CO2 26 28 26   GLUCOSE 105* 114* 109*  BUN 20 9 7   CREATININE 0.94 1.03 0.84  CALCIUM 9.1 8.6* 8.6*   Liver Function Tests:  Recent Labs Lab 08/29/14 0830 09/03/14 0456 09/04/14 0420  AST 31 78* 59*  ALT 37 95* 110*  ALKPHOS 37* 33* 33*  BILITOT 0.6 0.4 1.0  PROT 7.1 6.4* 6.5  ALBUMIN 4.0 3.4* 3.4*    CBC:  Recent Labs Lab 08/29/14 0830 09/02/14 1117 09/03/14 0456 09/03/14 1630 09/04/14 0420  WBC 7.1  --  11.6*  --  8.1  NEUTROABS 4.9  --  9.1*  --  5.8  HGB 14.0 13.6 12.8* 12.9* 12.8*  HCT 44.1 42.2 41.2 40.6 40.3  MCV 92.6  --  91.2  --  90.8  PLT 269  --  227  --  211    CBG:  Recent Labs Lab 09/02/14 0517 09/02/14 1051  GLUCAP 105* 151*    Principal Problem:   Morbid obesity Active Problems:   HTN  (hypertension)   DJD (degenerative joint disease) of knee   Hyperlipidemia   OSA on CPAP   Diabetes mellitus type 2 in obese   S/P gastric bypass   Time coordinating discharge: 15 minutes  Signed:  Atilano Ina, MD [facility], [place] [phone] 09/04/2014, 11:29 AM

## 2014-09-04 NOTE — Progress Notes (Signed)
Patient alert and oriented, pain is controlled. Patient is tolerating fluids,  advanced to protein shake today, patient tolerated well. Reviewed Gastric Bypass discharge instructions with patient and patient is able to articulate understanding. Provided information on BELT program, Support Group and [facility] outpatient pharmacy. All questions answered, will continue to monitor.    

## 2014-09-04 NOTE — Progress Notes (Signed)
Patient had placed self on home CPAP. Patient is resting comfortably. RT will continue to monitor.

## 2014-09-04 NOTE — Progress Notes (Signed)
Patient is alert and oriented, vital signs are stable, incisions are within normal limits, patient is up and ambulating and tolerating his bariatric shakes well, no complaints of pain or discomfort, discharge instructions reviewed with patient and spouse, wife educated regarding lovenox injections for discharge and how to administer properly, will continue to monitor Stanford Breed RN 11:45 AM 09-04-2014

## 2014-09-08 ENCOUNTER — Telehealth (HOSPITAL_COMMUNITY): Payer: Self-pay

## 2014-09-08 NOTE — Telephone Encounter (Signed)
Made discharge phone call to patient per DROP protocol. Asking the following questions.    1. Do you have someone to care for you now that you are home?  yes 2. Are you having pain now that is not relieved by your pain medication?  No, haven't used it 3. Are you able to drink the recommended daily amount of fluids (48 ounces minimum/day) and protein (60-80 grams/day) as prescribed by the dietitian or nutritional counselor?  yes 4. Are you taking the vitamins and minerals as prescribed?  yes 5. Do you have the "on call" number to contact your surgeon if you have a problem or question?  yes 6. Are your incisions free of redness, swelling or drainage? (If steri strips, address that these can fall off, shower as tolerated) yes 7. Have your bowels moved since your surgery?  If not, are you passing gas?  yes 8. Are you up and walking 3-4 times per day?  yes    1. Do you have an appointment made to see your surgeon in the next month?  yes 2. Were you provided your discharge medications before your surgery or before you were discharged from the hospital and are you taking them without problem?  yes 3. Were you provided phone numbers to the clinic/surgeon's office?  yes 4. Did you watch the patient education video module in the (clinic, surgeon's office, etc.) before your surgery? yes 5. Do you have a discharge checklist that was provided to you in the hospital to reference with instructions on how to take care of yourself after surgery?  yes 6. Did you see a dietitian or nutritional counselor while you were in the hospital?  yes 7. Do you have an appointment to see a dietitian or nutritional counselor in the next month?  yes

## 2014-09-16 ENCOUNTER — Encounter: Payer: BLUE CROSS/BLUE SHIELD | Attending: General Surgery

## 2014-09-16 DIAGNOSIS — Z713 Dietary counseling and surveillance: Secondary | ICD-10-CM | POA: Insufficient documentation

## 2014-09-16 DIAGNOSIS — Z6841 Body Mass Index (BMI) 40.0 and over, adult: Secondary | ICD-10-CM | POA: Insufficient documentation

## 2014-09-16 NOTE — Progress Notes (Signed)
  Follow-up visit:  2  Weeks Post-Operative RYGB Surgery  Medical Nutrition Therapy:  Appt start time: 1520 end time:  1550.  Primary concerns today: Post-operative Bariatric Surgery Nutrition Management. Returns with a 40.5 lbs weight loss. Feeling a bit weak sometimes. Tolerating liquids, eggs, low fat cheese, grilled shrimp, and grilled chicken from [employer].   Surgery date: 09/02/14 Surgery type: RYGB Start weight at [facility]: 433.5 lbs on 05/24/14 Weight today: 393.0  Weight change: 40.5 lbs  TANITA  BODY COMP RESULTS  07/14/14 09/16/14   BMI (kg/m^2) 57.2 51.8   Fat Mass (lbs) 125.5 113.0   Fat Free Mass (lbs) 308 280.0   Total Body Water (lbs) 225.5 205.0    Preferred Learning Style:   No preference indicated   Learning Readiness:   Ready  24-hr recall: B (AM): Premier Protein (30 g) Snk (AM): cheese stick (6 g)  L (PM): 4 grilled chicken nuggets (10 g) Snk (PM): yoplait whipped (9 g) D (PM): strained soup or half omelette (3-9 g) Snk (PM): SF popsicle or protein shake (0-30 g)  Fluid intake: 1-2 protein shakes, crystal light, at least 64 oz water with flavor Estimated total protein intake: 58-88 g  Medications: see list  Supplementation: taking  Using straws: No Drinking while eating: sips Hair loss: No Carbonated beverages: No N/V/D/C: has had constipation and taking dulcolax  Dumping syndrome: No  Recent physical activity:  Walking every day for 30 minutes  Progress Towards Goal(s):  In progress.  Handouts given during visit include:  Phase 3A High Protein    Nutritional Diagnosis:  [place]-3.3 Overweight/obesity related to past poor dietary habits and physical inactivity as evidenced by patient w/ recent RYGB surgery following dietary guidelines for continued weight loss.    Intervention:  Nutrition education/diet advancement. The following the learning objectives were met by the patient during this course:  Identifies Phase 3A (Soft, High Proteins)  Dietary Goals and will begin from 2 weeks post-operatively to 2 months post-operatively  Identifies appropriate sources of fluids and proteins   States protein recommendations and appropriate sources post-operatively  Identifies the need for appropriate texture modifications, mastication, and bite sizes when consuming solids  Identifies appropriate multivitamin and calcium sources post-operatively  Describes the need for physical activity post-operatively and will follow MD recommendations  States when to call healthcare provider regarding medication questions or post-operative complications  Teaching Method Utilized:  Visual Auditory Hands on  Barriers to learning/adherence to lifestyle change: none  Demonstrated degree of understanding via:  Teach Back   Monitoring/Evaluation:  Dietary intake, exercise, and body weight. Follow up in 6 weeks for 2 month post-op visit.

## 2014-10-28 ENCOUNTER — Encounter: Payer: Self-pay | Admitting: Dietician

## 2014-10-28 ENCOUNTER — Encounter: Payer: BLUE CROSS/BLUE SHIELD | Attending: General Surgery | Admitting: Dietician

## 2014-10-28 DIAGNOSIS — Z713 Dietary counseling and surveillance: Secondary | ICD-10-CM | POA: Diagnosis not present

## 2014-10-28 DIAGNOSIS — Z6841 Body Mass Index (BMI) 40.0 and over, adult: Secondary | ICD-10-CM | POA: Diagnosis not present

## 2014-10-28 NOTE — Progress Notes (Signed)
  Follow-up visit:  8 Weeks Post-Operative RYGB Surgery  Medical Nutrition Therapy:  Appt start time: 500 end time:  515  Primary concerns today: Post-operative Bariatric Surgery Nutrition Management. Jeffrey Molina returns having lost 27 pounds since post op class. He states that things have been going well. He has not had any vomiting or diarrhea. He is able to tolerate all recommended foods and many carbohydrates like creamsicles and baked beans. Can eat more than 3 oz of meat at a time. He has noticed a lot of itching and is also unable to sleep throughout the night.   Surgery date: 09/02/14 Surgery type: RYGB Start weight at [facility]: 433.5 lbs on 05/24/14 Weight today: 366 lbs Weight change: 27 lbs Total weight lost: 67.5 lbs  TANITA  BODY COMP RESULTS  07/14/14 09/16/14 10/28/14   BMI (kg/m^2) 57.2 51.8 48.3   Fat Mass (lbs) 125.5 113.0 84.5   Fat Free Mass (lbs) 308 280.0 281.5   Total Body Water (lbs) 225.5 205.0 206    Preferred Learning Style:  No preference indicated   Learning Readiness:   Ready  24-hr recall: B (AM): cheese (14g) Snk (AM): yogurt (12g) L (PM): 6 chicken nuggets from [employer] (14g) Snk (PM): 4 peanut butter crackers (2g)  D (PM): barbecue and baked beans OR 2 lean hot dogs with no bun with chili and slaw (28g) Snk (PM): creamsicle  Fluid intake: water, water with Crystal Light (at least 64 oz per patient) Estimated total protein intake: 80 grams per patient; ~70g based on diet recall  Medications: see list Supplementation: taking  Using straws: no Drinking while eating: sometimes sips Hair loss: none Carbonated beverages: 2 sips of diet Mtn Dew and did not like it N/V/D/C: some constipation Dumping syndrome: none  Recent physical activity:  No cartilage in knees, walks a lot throughout the day  Progress Towards Goal(s):  In progress.  Handouts given during visit include:  Phase 3B lean protein + non starchy vegetables   Nutritional Diagnosis:   [place]-3.3 Overweight/obesity related to past poor dietary habits and physical inactivity as evidenced by patient w/ recent RYGB surgery following dietary guidelines for continued weight loss.     Intervention:  Nutrition counseling provided.  Teaching Method Utilized:  Visual Auditory Hands on  Barriers to learning/adherence to lifestyle change: knee pain  Demonstrated degree of understanding via:  Teach Back   Monitoring/Evaluation:  Dietary intake, exercise, and body weight. Follow up in 2 months for 4 month post-op visit.

## 2014-10-28 NOTE — Patient Instructions (Addendum)
Goals:  Follow Phase 3B: High Protein + Non-Starchy Vegetables  Eat 3-6 small meals/snacks, every 3-5 hrs  Increase lean protein foods to meet 80g goal  Increase fluid intake to 64oz +  Avoid drinking 15 minutes before, during and 30 minutes after eating  Aim for >30 min of physical activity daily  TANITA  BODY COMP RESULTS  07/14/14 09/16/14 10/28/14   BMI (kg/m^2) 57.2 51.8 48.3   Fat Mass (lbs) 125.5 113.0 84.5   Fat Free Mass (lbs) 308 280.0 281.5   Total Body Water (lbs) 225.5 205.0 206

## 2014-11-06 ENCOUNTER — Ambulatory Visit (INDEPENDENT_AMBULATORY_CARE_PROVIDER_SITE_OTHER): Payer: BLUE CROSS/BLUE SHIELD | Admitting: Family Medicine

## 2014-11-06 ENCOUNTER — Encounter: Payer: Self-pay | Admitting: Family Medicine

## 2014-11-06 VITALS — BP 132/84 | HR 76 | Temp 98.0°F | Ht 73.0 in | Wt 356.0 lb

## 2014-11-06 DIAGNOSIS — R000 Tachycardia, unspecified: Secondary | ICD-10-CM

## 2014-11-06 DIAGNOSIS — E669 Obesity, unspecified: Secondary | ICD-10-CM

## 2014-11-06 DIAGNOSIS — E119 Type 2 diabetes mellitus without complications: Secondary | ICD-10-CM

## 2014-11-06 DIAGNOSIS — I10 Essential (primary) hypertension: Secondary | ICD-10-CM

## 2014-11-06 DIAGNOSIS — N390 Urinary tract infection, site not specified: Secondary | ICD-10-CM

## 2014-11-06 DIAGNOSIS — Z9989 Dependence on other enabling machines and devices: Secondary | ICD-10-CM

## 2014-11-06 DIAGNOSIS — G4733 Obstructive sleep apnea (adult) (pediatric): Secondary | ICD-10-CM

## 2014-11-06 DIAGNOSIS — E1169 Type 2 diabetes mellitus with other specified complication: Secondary | ICD-10-CM

## 2014-11-06 NOTE — Patient Instructions (Signed)
Probiotics daily [employer]age or [employer] or online at [employer], [employer], 10 strain probiotic  Urinary Tract Infection Urinary tract infections (UTIs) can develop anywhere along your urinary tract. Your urinary tract is your body's drainage system for removing wastes and extra water. Your urinary tract includes two kidneys, two ureters, a bladder, and a urethra. Your kidneys are a pair of bean-shaped organs. Each kidney is about the size of your fist. They are located below your ribs, one on each side of your spine. CAUSES Infections are caused by microbes, which are microscopic organisms, including fungi, viruses, and bacteria. These organisms are so small that they can only be seen through a microscope. Bacteria are the microbes that most commonly cause UTIs. SYMPTOMS  Symptoms of UTIs may vary by age and gender of the patient and by the location of the infection. Symptoms in young women typically include a frequent and intense urge to urinate and a painful, burning feeling in the bladder or urethra during urination. Older women and men are more likely to be tired, shaky, and weak and have muscle aches and abdominal pain. A fever may mean the infection is in your kidneys. Other symptoms of a kidney infection include pain in your back or sides below the ribs, nausea, and vomiting. DIAGNOSIS To diagnose a UTI, your caregiver will ask you about your symptoms. Your caregiver also will ask to provide a urine sample. The urine sample will be tested for bacteria and white blood cells. White blood cells are made by your body to help fight infection. TREATMENT  Typically, UTIs can be treated with medication. Because most UTIs are caused by a bacterial infection, they usually can be treated with the use of antibiotics. The choice of antibiotic and length of treatment depend on your symptoms and the type of bacteria causing your infection. HOME CARE INSTRUCTIONS  If you were  prescribed antibiotics, take them exactly as your caregiver instructs you. Finish the medication even if you feel better after you have only taken some of the medication.  Drink enough water and fluids to keep your urine clear or pale yellow.  Avoid caffeine, tea, and carbonated beverages. They tend to irritate your bladder.  Empty your bladder often. Avoid holding urine for long periods of time.  Empty your bladder before and after sexual intercourse.  After a bowel movement, women should cleanse from front to back. Use each tissue only once. SEEK MEDICAL CARE IF:   You have back pain.  You develop a fever.  Your symptoms do not begin to resolve within 3 days. SEEK IMMEDIATE MEDICAL CARE IF:   You have severe back pain or lower abdominal pain.  You develop chills.  You have nausea or vomiting.  You have continued burning or discomfort with urination. MAKE SURE YOU:   Understand these instructions.  Will watch your condition.  Will get help right away if you are not doing well or get worse. Document Released: 12/15/2004 Document Revised: 09/06/2011 Document Reviewed: 04/15/2011 [facility] Patient Information 2015 [place], [place]. This information is not intended to replace advice given to you by your health care provider. Make sure you discuss any questions you have with your health care provider.

## 2014-11-06 NOTE — Progress Notes (Signed)
Pre visit review using our clinic review tool, if applicable. No additional management support is needed unless otherwise documented below in the visit note. 

## 2014-11-07 LAB — URINALYSIS
Bilirubin Urine: NEGATIVE
Hgb urine dipstick: NEGATIVE
Leukocytes, UA: NEGATIVE
Nitrite: NEGATIVE
PH: 5.5 (ref 5.0–8.0)
Total Protein, Urine: NEGATIVE
UROBILINOGEN UA: 0.2 (ref 0.0–1.0)
Urine Glucose: NEGATIVE

## 2014-11-07 LAB — MICROALBUMIN / CREATININE URINE RATIO
Creatinine,U: 359.4 mg/dL
Microalb Creat Ratio: 0.3 mg/g (ref 0.0–30.0)
Microalb, Ur: 1.2 mg/dL (ref 0.0–1.9)

## 2014-11-08 LAB — URINE CULTURE
Colony Count: NO GROWTH
ORGANISM ID, BACTERIA: NO GROWTH

## 2014-11-16 ENCOUNTER — Encounter: Payer: Self-pay | Admitting: Family Medicine

## 2014-11-16 NOTE — Assessment & Plan Note (Signed)
hgba1c acceptable, minimize simple carbs. Increase exercise as tolerated. Continue current meds, numbers improved s/p bypass surgery

## 2014-11-16 NOTE — Assessment & Plan Note (Signed)
Good weight loss s/p bypass. Encouraged DASH diet, decrease po intake and increase exercise as tolerated. Needs 7-8 hours of sleep nightly. Avoid trans fats, eat small, frequent meals every 4-5 hours with lean proteins, complex carbs and healthy fats. Minimize simple carbs, GMO foods.

## 2014-11-16 NOTE — Assessment & Plan Note (Signed)
Well controlled, no changes to meds. Encouraged heart healthy diet such as the DASH diet and exercise as tolerated.  °

## 2014-11-16 NOTE — Assessment & Plan Note (Signed)
Uses CPAP nightly 

## 2014-11-16 NOTE — Assessment & Plan Note (Signed)
RRR today 

## 2014-11-16 NOTE — Progress Notes (Signed)
Subjective:    Patient ID: Jeffrey Molina, male    DOB: 17-Apr-1960, 54 y.o.   MRN: [id number]  Chief Complaint  Patient presents with  . Follow-up    HPI Patient is in today for follow up on numerous conditions. Has undergone bypass and is doing very well. Just finishing a course of Cipro for a UTI and feels well. Denies polyuria and polydipsia. No other recent illness. Denies CP/palp/SOB/HA/congestion/fevers/GI or GU c/o. Taking meds as prescribed  Past Medical History  Diagnosis Date  . Hypertension   . Chicken pox as a child  . Low testosterone 10/28/2013  . Cutaneous skin tags 10/28/2013  . Peripheral artery disease 11/03/2013  . Diabetes mellitus type 2 in obese 01/27/2014  . Tachycardia 05/15/2014  . Sleep apnea     has c-pap machine  . GERD (gastroesophageal reflux disease)   . Arthritis     both knees  . Anemia 10/28/2013    pt. stated "no" at preop appt. 08-29-14    Past Surgical History  Procedure Laterality Date  . Colonscopy      x 2  . 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 Fibromyalgia Mother   . Cancer Father     skin cancer  . Cataracts Father   . Arthritis Father   . Diabetes Maternal Grandfather     Social History   Social History  . Marital Status: Married    Spouse Name: N/A  . Number of Children: N/A  . Years of Education: N/A   Occupational History  . Manager [employer]   Social History Main Topics  . Smoking status: Never Smoker   . Smokeless tobacco: Never Used  . Alcohol Use: No     Comment: very rare. special occasions  . Drug Use: No  . Sexual Activity: Yes     Comment: lives with wife and children, works for [employer]   Other Topics Concern  . Not on file   Social History Narrative    Outpatient  Prescriptions Prior to Visit  Medication Sig Dispense Refill  . Calcium Carb-Cholecalciferol (CALCIUM 600 + D PO) Take 1 tablet by mouth 3 (three) times daily.    . Methylcobalamin (B-12) 5000 MCG TBDP Take 1 tablet by mouth daily.    . metoprolol tartrate (LOPRESSOR) 25 MG tablet Take 0.5 tablets (12.5 mg total) by mouth 2 (two) times daily. 60 tablet 1  . Multiple Vitamin (MULTIVITAMIN WITH MINERALS) TABS tablet Take 1 tablet by mouth 2 (two) times daily.    Marland Kitchen docusate sodium (COLACE) 100 MG capsule Take 100 mg by mouth 2 (two) times daily.    Marland Kitchen enoxaparin (LOVENOX) 40 MG/0.4ML injection Inject 0.4 mLs (40 mg total) into the skin daily. 14 Syringe 0  . mupirocin ointment (BACTROBAN) 2 % Place 1 application into the nose 2 (two) times daily. 22 g 0  . ondansetron (ZOFRAN ODT) 4 MG disintegrating tablet Take 1 tablet (4 mg total) by mouth every 8 (eight) hours as needed for nausea or vomiting. 20 tablet 0  . oxyCODONE (ROXICODONE) 5 MG/5ML solution Take 5-10 mLs (5-10 mg total) by mouth every 4 (four) hours as needed for moderate pain or severe pain.  0  . pantoprazole (PROTONIX) 40 MG tablet Take 1  tablet (40 mg total) by mouth daily. 30 tablet 0   No facility-administered medications prior to visit.    Allergies  Allergen Reactions  . Lisinopril-Hydrochlorothiazide Swelling    Lip swollen    Review of Systems  Constitutional: Negative for fever and malaise/fatigue.  HENT: Negative for congestion.   Eyes: Negative for discharge.  Respiratory: Negative for shortness of breath.   Cardiovascular: Negative for chest pain, palpitations and leg swelling.  Gastrointestinal: Negative for nausea and abdominal pain.  Genitourinary: Negative for dysuria.  Musculoskeletal: Negative for falls.  Skin: Negative for rash.  Neurological: Negative for loss of consciousness and headaches.  Endo/Heme/Allergies: Negative for environmental allergies.  Psychiatric/Behavioral: Negative for depression. The  patient is not nervous/anxious.        Objective:    Physical Exam  Constitutional: He is oriented to person, place, and time. He appears well-developed and well-nourished. No distress.  HENT:  Head: Normocephalic and atraumatic.  Nose: Nose normal.  Eyes: Right eye exhibits no discharge. Left eye exhibits no discharge.  Neck: Normal range of motion. Neck supple.  Cardiovascular: Normal rate and regular rhythm.   No murmur heard. Pulmonary/Chest: Effort normal and breath sounds normal.  Abdominal: Soft. Bowel sounds are normal. There is no tenderness.  Musculoskeletal: He exhibits no edema.  Neurological: He is alert and oriented to person, place, and time.  Skin: Skin is warm and dry.  Psychiatric: He has a normal mood and affect.  Nursing note and vitals reviewed.   BP 132/84 mmHg  Pulse 76  Temp(Src) 98 F (36.7 C) (Oral)  Ht 6\' 1"  (1.854 m)  Wt 356 lb (161.481 kg)  BMI 46.98 kg/m2  SpO2 98% Wt Readings from Last 3 Encounters:  11/06/14 356 lb (161.481 kg)  10/28/14 366 lb (166.017 kg)  09/16/14 393 lb (178.264 kg)     Lab Results  Component Value Date   WBC 8.1 09/04/2014   HGB 12.8* 09/04/2014   HCT 40.3 09/04/2014   PLT 211 09/04/2014   GLUCOSE 109* 09/04/2014   CHOL 172 07/07/2014   TRIG 177.0* 07/07/2014   HDL 29.70* 07/07/2014   LDLCALC 107* 07/07/2014   ALT 110* 09/04/2014   AST 59* 09/04/2014   NA 138 09/04/2014   K 3.8 09/04/2014   CL 102 09/04/2014   CREATININE 0.84 09/04/2014   BUN 7 09/04/2014   CO2 26 09/04/2014   TSH 5.05* 07/07/2014   HGBA1C 6.3 07/07/2014   MICROALBUR 1.2 11/07/2014    Lab Results  Component Value Date   TSH 5.05* 07/07/2014   Lab Results  Component Value Date   WBC 8.1 09/04/2014   HGB 12.8* 09/04/2014   HCT 40.3 09/04/2014   MCV 90.8 09/04/2014   PLT 211 09/04/2014   Lab Results  Component Value Date   NA 138 09/04/2014   K 3.8 09/04/2014   CO2 26 09/04/2014   GLUCOSE 109* 09/04/2014   BUN 7  09/04/2014   CREATININE 0.84 09/04/2014   BILITOT 1.0 09/04/2014   ALKPHOS 33* 09/04/2014   AST 59* 09/04/2014   ALT 110* 09/04/2014   PROT 6.5 09/04/2014   ALBUMIN 3.4* 09/04/2014   CALCIUM 8.6* 09/04/2014   ANIONGAP 10 09/04/2014   GFR 82.40 07/07/2014   Lab Results  Component Value Date   CHOL 172 07/07/2014   Lab Results  Component Value Date   HDL 29.70* 07/07/2014   Lab Results  Component Value Date   LDLCALC 107* 07/07/2014   Lab Results  Component  Value Date   TRIG 177.0* 07/07/2014   Lab Results  Component Value Date   CHOLHDL 6 07/07/2014   Lab Results  Component Value Date   HGBA1C 6.3 07/07/2014       Assessment & Plan:   Problem List Items Addressed This Visit    Tachycardia    RRR today      OSA on CPAP    Uses CPAP nightly      Morbid obesity (Chronic)    Good weight loss s/p bypass. Encouraged DASH diet, decrease po intake and increase exercise as tolerated. Needs 7-8 hours of sleep nightly. Avoid trans fats, eat small, frequent meals every 4-5 hours with lean proteins, complex carbs and healthy fats. Minimize simple carbs, GMO foods.      HTN (hypertension)    Well controlled, no changes to meds. Encouraged heart healthy diet such as the DASH diet and exercise as tolerated.       Diabetes mellitus type 2 in obese    hgba1c acceptable, minimize simple carbs. Increase exercise as tolerated. Continue current meds, numbers improved s/p bypass surgery      Relevant Orders   Urinalysis (Completed)   Microalbumin / creatinine urine ratio (Completed)   Urine culture (Completed)    Other Visit Diagnoses    Urinary tract infection without hematuria, site unspecified    -  Primary    Relevant Orders    Urinalysis (Completed)    Microalbumin / creatinine urine ratio (Completed)    Urine culture (Completed)       I have discontinued Mr. Kirschenmann's mupirocin ointment, docusate sodium, oxyCODONE, ondansetron, pantoprazole, and enoxaparin. I  am also having him maintain his B-12, Calcium Carb-Cholecalciferol (CALCIUM 600 + D PO), multivitamin with minerals, and metoprolol tartrate.  No orders of the defined types were placed in this encounter.     Danise Edge, MD

## 2014-12-09 ENCOUNTER — Encounter: Payer: Self-pay | Admitting: Family Medicine

## 2014-12-12 ENCOUNTER — Ambulatory Visit (INDEPENDENT_AMBULATORY_CARE_PROVIDER_SITE_OTHER): Payer: BLUE CROSS/BLUE SHIELD | Admitting: Physician Assistant

## 2014-12-12 ENCOUNTER — Encounter: Payer: Self-pay | Admitting: Physician Assistant

## 2014-12-12 VITALS — BP 110/74 | HR 83 | Temp 97.8°F | Resp 16 | Ht 73.0 in | Wt 326.4 lb

## 2014-12-12 DIAGNOSIS — R1032 Left lower quadrant pain: Secondary | ICD-10-CM

## 2014-12-12 DIAGNOSIS — R197 Diarrhea, unspecified: Secondary | ICD-10-CM

## 2014-12-12 LAB — COMPREHENSIVE METABOLIC PANEL WITH GFR
ALT: 14 U/L (ref 0–53)
AST: 15 U/L (ref 0–37)
Albumin: 3.6 g/dL (ref 3.5–5.2)
Alkaline Phosphatase: 46 U/L (ref 39–117)
BUN: 11 mg/dL (ref 6–23)
CO2: 31 meq/L (ref 19–32)
Calcium: 9.1 mg/dL (ref 8.4–10.5)
Chloride: 99 meq/L (ref 96–112)
Creatinine, Ser: 1.02 mg/dL (ref 0.40–1.50)
GFR: 81.33 mL/min
Glucose, Bld: 110 mg/dL — ABNORMAL HIGH (ref 70–99)
Potassium: 3.4 meq/L — ABNORMAL LOW (ref 3.5–5.1)
Sodium: 138 meq/L (ref 135–145)
Total Bilirubin: 0.5 mg/dL (ref 0.2–1.2)
Total Protein: 7.2 g/dL (ref 6.0–8.3)

## 2014-12-12 LAB — CBC WITH DIFFERENTIAL/PLATELET
BASOS ABS: 0 10*3/uL (ref 0.0–0.1)
Basophils Relative: 0.4 % (ref 0.0–3.0)
Eosinophils Absolute: 0.2 10*3/uL (ref 0.0–0.7)
Eosinophils Relative: 2.8 % (ref 0.0–5.0)
HCT: 43.6 % (ref 39.0–52.0)
Hemoglobin: 14.2 g/dL (ref 13.0–17.0)
Lymphocytes Relative: 19.3 % (ref 12.0–46.0)
Lymphs Abs: 1.4 10*3/uL (ref 0.7–4.0)
MCHC: 32.5 g/dL (ref 30.0–36.0)
MCV: 88.9 fl (ref 78.0–100.0)
MONO ABS: 1.5 10*3/uL — AB (ref 0.1–1.0)
Monocytes Relative: 21.5 % — ABNORMAL HIGH (ref 3.0–12.0)
NEUTROS PCT: 56 % (ref 43.0–77.0)
Neutro Abs: 3.9 10*3/uL (ref 1.4–7.7)
Platelets: 391 10*3/uL (ref 150.0–400.0)
RBC: 4.9 Mil/uL (ref 4.22–5.81)
RDW: 16 % — ABNORMAL HIGH (ref 11.5–15.5)
WBC: 7 10*3/uL (ref 4.0–10.5)

## 2014-12-12 MED ORDER — CIPROFLOXACIN HCL 500 MG PO TABS: 500.0000 mg | ORAL_TABLET | Freq: Two times a day (BID) | ORAL | Status: DC

## 2014-12-12 NOTE — Patient Instructions (Signed)
Please stay well hydrated and get plenty of rest. Try to eat bland foods (follow diet recommendations below). Finish the [place]yl given by the Urgent Care. They will be calling you with your stool results as soon as they are in.   Please go to the lab for blood work. I will call you with your results. Because pain is mild and very rare, and there is no tenderness on exam, I am going to hold off on imaging or other medications for now until results are in.  Food Choices to Help Relieve Diarrhea When you have diarrhea, the foods you eat and your eating habits are very important. Choosing the right foods and drinks can help relieve diarrhea. Also, because diarrhea can last up to 7 days, you need to replace lost fluids and electrolytes (such as sodium, potassium, and chloride) in order to help prevent dehydration.  WHAT GENERAL GUIDELINES DO I NEED TO FOLLOW?  Slowly drink 1 cup (8 oz) of fluid for each episode of diarrhea. If you are getting enough fluid, your urine will be clear or pale yellow.  Eat starchy foods. Some good choices include white rice, white toast, pasta, low-fiber cereal, baked potatoes (without the skin), saltine crackers, and bagels.  Avoid large servings of any cooked vegetables.  Limit fruit to two servings per day. A serving is  cup or 1 small piece.  Choose foods with less than 2 g of fiber per serving.  Limit fats to less than 8 tsp (38 g) per day.  Avoid fried foods.  Eat foods that have probiotics in them. Probiotics can be found in certain dairy products.  Avoid foods and beverages that may increase the speed at which food moves through the stomach and intestines (gastrointestinal tract). Things to avoid include:  High-fiber foods, such as dried fruit, raw fruits and vegetables, nuts, seeds, and whole grain foods.  Spicy foods and high-fat foods.  Foods and beverages sweetened with high-fructose corn syrup, honey, or sugar alcohols such as xylitol, sorbitol,  and mannitol. WHAT FOODS ARE RECOMMENDED? Grains White rice. White, [place], or pita breads (fresh or toasted), including plain rolls, buns, or bagels. White pasta. Saltine, soda, or graham crackers. Pretzels. Low-fiber cereal. Cooked cereals made with water (such as cornmeal, farina, or cream cereals). Plain muffins. Matzo. Melba toast. Zwieback.  Vegetables Potatoes (without the skin). Strained tomato and vegetable juices. Most well-cooked and canned vegetables without seeds. Tender lettuce. Fruits Cooked or canned applesauce, apricots, cherries, fruit cocktail, grapefruit, peaches, pears, or plums. Fresh bananas, apples without skin, cherries, grapes, cantaloupe, grapefruit, peaches, oranges, or plums.  Meat and Other Protein Products Baked or boiled chicken. Eggs. Tofu. Fish. Seafood. Smooth peanut butter. Ground or well-cooked tender beef, ham, veal, lamb, pork, or poultry.  Dairy Plain yogurt, kefir, and unsweetened liquid yogurt. Lactose-free milk, buttermilk, or soy milk. Plain hard cheese. Beverages Sport drinks. Clear broths. Diluted fruit juices (except prune). Regular, caffeine-free sodas such as ginger ale. Water. Decaffeinated teas. Oral rehydration solutions. Sugar-free beverages not sweetened with sugar alcohols. Other Bouillon, broth, or soups made from recommended foods.  The items listed above may not be a complete list of recommended foods or beverages. Contact your dietitian for more options. WHAT FOODS ARE NOT RECOMMENDED? Grains Whole grain, whole wheat, bran, or rye breads, rolls, pastas, crackers, and cereals. Wild or brown rice. Cereals that contain more than 2 g of fiber per serving. Corn tortillas or taco shells. Cooked or dry oatmeal. Granola. Popcorn. Vegetables Raw vegetables. Cabbage,  broccoli, Brussels sprouts, artichokes, baked beans, beet greens, corn, kale, legumes, peas, sweet potatoes, and yams. Potato skins. Cooked spinach and cabbage. Fruits Dried fruit,  including raisins and dates. Raw fruits. Stewed or dried prunes. Fresh apples with skin, apricots, mangoes, pears, raspberries, and strawberries.  Meat and Other Protein Products Chunky peanut butter. Nuts and seeds. Beans and lentils. Tomasa Blase.  Dairy High-fat cheeses. Milk, chocolate milk, and beverages made with milk, such as milk shakes. Cream. Ice cream. Sweets and Desserts Sweet rolls, doughnuts, and sweet breads. Pancakes and waffles. Fats and Oils Butter. Cream sauces. Margarine. Salad oils. Plain salad dressings. Olives. Avocados.  Beverages Caffeinated beverages (such as coffee, tea, soda, or energy drinks). Alcoholic beverages. Fruit juices with pulp. Prune juice. Soft drinks sweetened with high-fructose corn syrup or sugar alcohols. Other Coconut. Hot sauce. Chili powder. Mayonnaise. Gravy. Cream-based or milk-based soups.  The items listed above may not be a complete list of foods and beverages to avoid. Contact your dietitian for more information. WHAT SHOULD I DO IF I BECOME DEHYDRATED? Diarrhea can sometimes lead to dehydration. Signs of dehydration include dark urine and dry mouth and skin. If you think you are dehydrated, you should rehydrate with an oral rehydration solution. These solutions can be purchased at pharmacies, retail stores, or online.  Drink -1 cup (120-240 mL) of oral rehydration solution each time you have an episode of diarrhea. If drinking this amount makes your diarrhea worse, try drinking smaller amounts more often. For example, drink 1-3 tsp (5-15 mL) every 5-10 minutes.  A general rule for staying hydrated is to drink 1-2 L of fluid per day. Talk to your health care Aleecia Tapia about the specific amount you should be drinking each day. Drink enough fluids to keep your urine clear or pale yellow. Document Released: 05/28/2003 Document Revised: 03/12/2013 Document Reviewed: 01/28/2013 [facility] Patient Information 2015 [place], [place]. This information is not  intended to replace advice given to you by your health care Ronee Ranganathan. Make sure you discuss any questions you have with your health care Laurieann Friddle.

## 2014-12-12 NOTE — Progress Notes (Signed)
Patient presents to clinic today c/o diarrhea with 12 pound weight loss over the past 9-10 days. Endorses some mild LLQ pain and low-grade fever at onset of symptoms. Was evaluated at [facility] and given Rx Flagyl to take for possible colitis versus mild c. Diff. C. Diff PCR and labs obtained. Reviewed UC labs in office. Blood work unremarkable. C. Diff pending. Patient endorses fever and pain have resolved but still having frequent loose, non-bloody stools. Is able to tolerate PO fluids and foods. Is concerned mainly about weight loss. Recently underwent gastric bypass surgery so have been losing weight weekly before symptoms began.  Past Medical History  Diagnosis Date  . Hypertension   . Chicken pox as a child  . Low testosterone 10/28/2013  . Cutaneous skin tags 10/28/2013  . Peripheral artery disease 11/03/2013  . Diabetes mellitus type 2 in obese 01/27/2014  . Tachycardia 05/15/2014  . Sleep apnea     has c-pap machine  . GERD (gastroesophageal reflux disease)   . Arthritis     both knees  . Anemia 10/28/2013    pt. stated "no" at preop appt. 08-29-14    Current Outpatient Prescriptions on File Prior to Visit  Medication Sig Dispense Refill  . Calcium Carb-Cholecalciferol (CALCIUM 600 + D PO) Take 1 tablet by mouth 3 (three) times daily.    . Methylcobalamin (B-12) 5000 MCG TBDP Take 1 tablet by mouth daily.    . metoprolol tartrate (LOPRESSOR) 25 MG tablet Take 0.5 tablets (12.5 mg total) by mouth 2 (two) times daily. 60 tablet 1  . Multiple Vitamin (MULTIVITAMIN WITH MINERALS) TABS tablet Take 1 tablet by mouth 2 (two) times daily.     No current facility-administered medications on file prior to visit.    Allergies  Allergen Reactions  . Lisinopril-Hydrochlorothiazide Swelling    Lip swollen    Family History  Problem Relation Age of Onset  . Fibromyalgia Mother   . Cancer Father     skin cancer  . Cataracts Father   . Arthritis Father   . Diabetes Maternal Grandfather      Social History   Social History  . Marital Status: Married    Spouse Name: N/A  . Number of Children: N/A  . Years of Education: N/A   Occupational History  . Manager [employer]   Social History Main Topics  . Smoking status: Never Smoker   . Smokeless tobacco: Never Used  . Alcohol Use: No     Comment: very rare. special occasions  . Drug Use: No  . Sexual Activity: Yes     Comment: lives with wife and children, works for [employer]   Other Topics Concern  . None   Social History Narrative    Review of Systems - See HPI.  All other ROS are negative.  BP 110/74 mmHg  Pulse 83  Temp(Src) 97.8 F (36.6 C) (Oral)  Resp 16  Ht 6' 1" (1.854 m)  Wt 326 lb 6 oz (148.043 kg)  BMI 43.07 kg/m2  SpO2 98%  Physical Exam  Constitutional: He is oriented to person, place, and time and well-developed, well-nourished, and in no distress.  HENT:  Head: Normocephalic and atraumatic.  Eyes: Conjunctivae are normal.  Cardiovascular: Normal rate, regular rhythm, normal heart sounds and intact distal pulses.   Pulmonary/Chest: Effort normal and breath sounds normal. No respiratory distress. He has no wheezes. He has no rales. He exhibits no tenderness.  Abdominal: Soft. Bowel  sounds are normal. He exhibits no distension and no mass. There is no tenderness. There is no rebound and no guarding.  Neurological: He is alert and oriented to person, place, and time.  Skin: Skin is warm and dry. No rash noted.  Psychiatric: Affect normal.  Vitals reviewed.   Recent Results (from the past 2160 hour(s))  Urinalysis     Status: Abnormal   Collection Time: 11/07/14 12:18 PM  Result Value Ref Range   Color, Urine YELLOW Yellow;Lt. Yellow   APPearance CLOUDY (A) Clear   Specific Gravity, Urine >=1.030 (A) 1.000 - 1.030   pH 5.5 5.0 - 8.0   Total Protein, Urine NEGATIVE Negative   Urine Glucose NEGATIVE Negative   Ketones, ur TRACE (A) Negative   Bilirubin Urine  NEGATIVE Negative   Hgb urine dipstick NEGATIVE Negative   Urobilinogen, UA 0.2 0.0 - 1.0   Leukocytes, UA NEGATIVE Negative   Nitrite NEGATIVE Negative  Microalbumin / creatinine urine ratio     Status: None   Collection Time: 11/07/14 12:18 PM  Result Value Ref Range   Microalb, Ur 1.2 0.0 - 1.9 mg/dL   Creatinine,U 359.4 mg/dL   Microalb Creat Ratio 0.3 0.0 - 30.0 mg/g  Urine culture     Status: None   Collection Time: 11/07/14 12:18 PM  Result Value Ref Range   Colony Count NO GROWTH    Organism ID, Bacteria NO GROWTH   CBC w/Diff     Status: Abnormal   Collection Time: 12/12/14 11:31 AM  Result Value Ref Range   WBC 7.0 4.0 - 10.5 K/uL   RBC 4.90 4.22 - 5.81 Mil/uL   Hemoglobin 14.2 13.0 - 17.0 g/dL   HCT 43.6 39.0 - 52.0 %   MCV 88.9 78.0 - 100.0 fl   MCHC 32.5 30.0 - 36.0 g/dL   RDW 16.0 (H) 11.5 - 15.5 %   Platelets 391.0 150.0 - 400.0 K/uL   Neutrophils Relative % 56.0 43.0 - 77.0 %   Lymphocytes Relative 19.3 12.0 - 46.0 %   Monocytes Relative 21.5 Repeated and verified X2. (H) 3.0 - 12.0 %   Eosinophils Relative 2.8 0.0 - 5.0 %   Basophils Relative 0.4 0.0 - 3.0 %   Neutro Abs 3.9 1.4 - 7.7 K/uL   Lymphs Abs 1.4 0.7 - 4.0 K/uL   Monocytes Absolute 1.5 (H) 0.1 - 1.0 K/uL   Eosinophils Absolute 0.2 0.0 - 0.7 K/uL   Basophils Absolute 0.0 0.0 - 0.1 K/uL  Comp Met (CMET)     Status: Abnormal   Collection Time: 12/12/14 11:31 AM  Result Value Ref Range   Sodium 138 135 - 145 mEq/L   Potassium 3.4 (L) 3.5 - 5.1 mEq/L   Chloride 99 96 - 112 mEq/L   CO2 31 19 - 32 mEq/L   Glucose, Bld 110 (H) 70 - 99 mg/dL   BUN 11 6 - 23 mg/dL   Creatinine, Ser 1.02 0.40 - 1.50 mg/dL   Total Bilirubin 0.5 0.2 - 1.2 mg/dL   Alkaline Phosphatase 46 39 - 117 U/L   AST 15 0 - 37 U/L   ALT 14 0 - 53 U/L   Total Protein 7.2 6.0 - 8.3 g/dL   Albumin 3.6 3.5 - 5.2 g/dL   Calcium 9.1 8.4 - 10.5 mg/dL   GFR 81.33 >60.00 mL/min    Assessment/Plan: Diarrhea Mild without fever of  pain. Tolerating Flagyl well. Abdominal exam without distention, abnormal bowel sounds or any palpable  tenderness. Will repeat CBC and CMP. Patient to keep tabs with UC regarding c. Diff and stool study results. No need to repeat in our office. Increase fluids. BRAT diet discussed. Weight loss is more pronounced due to obesity and recent gastric bypass surgery. If not continuing to improve, will proceed with CT and GI referral.

## 2014-12-13 DIAGNOSIS — R197 Diarrhea, unspecified: Secondary | ICD-10-CM | POA: Insufficient documentation

## 2014-12-13 NOTE — Assessment & Plan Note (Signed)
Mild without fever of pain. Tolerating Flagyl well. Abdominal exam without distention, abnormal bowel sounds or any palpable tenderness. Will repeat CBC and CMP. Patient to keep tabs with UC regarding c. Diff and stool study results. No need to repeat in our office. Increase fluids. BRAT diet discussed. Weight loss is more pronounced due to obesity and recent gastric bypass surgery. If not continuing to improve, will proceed with CT and GI referral.

## 2014-12-15 ENCOUNTER — Telehealth: Payer: Self-pay | Admitting: *Deleted

## 2014-12-15 NOTE — Telephone Encounter (Signed)
Spoke with patient RE: his UC visit with ABX px and follow-up on his condition; pt reports that he is improving with the medication given, including that he is better today & "not going to the bathroom [diarrhea] as much". Patient does report that he is having muscle/joint pain & soreness in Neck, Wrist & Ankles; informed pt this information would be forwarded to PCP/SLS Please Advise.

## 2014-12-15 NOTE — Telephone Encounter (Signed)
He is likely having an inflammatory arthritic response. Cannot take steroids it will make his infection worse but he could have Tramadol 50 tabs 1 tab po tid prn disp #40 and warn patient it might make him sleepy. #40

## 2014-12-16 MED ORDER — TRAMADOL HCL 50 MG PO TABS: 50.0000 mg | ORAL_TABLET | Freq: Three times a day (TID) | ORAL | Status: DC | PRN

## 2014-12-16 NOTE — Telephone Encounter (Signed)
LMOM with contact name and number [for return call, if needed] RE: new Rx for patient's pain discussed via telephone on [phone] and further provider response & instructions; informed will sent Rx to pharmacy w/instructions and to call back if he has any further questions and/or concerns; Rx printed for PCP signature, to be faxed/SLS

## 2014-12-17 ENCOUNTER — Encounter: Payer: Self-pay | Admitting: Family Medicine

## 2014-12-17 ENCOUNTER — Other Ambulatory Visit: Payer: Self-pay | Admitting: Family Medicine

## 2014-12-17 MED ORDER — CIPROFLOXACIN HCL 500 MG PO TABS: 500.0000 mg | ORAL_TABLET | Freq: Two times a day (BID) | ORAL | Status: DC

## 2014-12-29 ENCOUNTER — Encounter: Payer: BLUE CROSS/BLUE SHIELD | Attending: General Surgery | Admitting: Dietician

## 2014-12-29 DIAGNOSIS — Z6841 Body Mass Index (BMI) 40.0 and over, adult: Secondary | ICD-10-CM | POA: Diagnosis not present

## 2014-12-29 DIAGNOSIS — Z713 Dietary counseling and surveillance: Secondary | ICD-10-CM | POA: Insufficient documentation

## 2014-12-29 NOTE — Progress Notes (Signed)
  Follow-up visit:  4 months Post-Operative RYGB Surgery  Medical Nutrition Therapy:  Appt start time: 430 end time:  500  Primary concerns today: Post-operative Bariatric Surgery Nutrition Management. Waymon returns having lost another 33 pounds since last visit. He states he has had a UTI and salmonella since last visit. Started taking a probiotic. Taking a Men's multivitamin but iron levels are within normal range.   Surgery date: 09/02/14 Surgery type: RYGB Start weight at [facility]: 433.5 lbs on 05/24/14 Weight today: 333 lbs Weight change: 33 lbs Total weight lost: 100.5 lbs  TANITA  BODY COMP RESULTS  07/14/14 09/16/14 10/28/14 12/29/14   BMI (kg/m^2) 57.2 51.8 48.3 43.9   Fat Mass (lbs) 125.5 113.0 84.5 67   Fat Free Mass (lbs) 308 280.0 281.5 266   Total Body Water (lbs) 225.5 205.0 206 194.5    Preferred Learning Style:  No preference indicated   Learning Readiness:   Ready  24-hr recall: B (AM): sausage patty and hashbrown from [employer] (7g) Snk (AM): Premier protein shake (30g) L (PM): steak and pineapple (21g) Snk (PM): 4 peanut butter crackers (2g)  D (PM): chicken tenders (21g) Snk (PM): creamsicle  Fluid intake: water, water with Crystal Light, G2 (at least 64 oz per patient) Estimated total protein intake: ~80 grams per day  Medications: see list Supplementation: taking  Using straws: no Drinking while eating: sometimes sips Hair loss: none Carbonated beverages: none N/V/D/C: none Dumping syndrome: none  Recent physical activity:  No cartilage in knees, walks a lot throughout the day  Progress Towards Goal(s):  In progress.   Nutritional Diagnosis:  [place]-3.3 Overweight/obesity related to past poor dietary habits and physical inactivity as evidenced by patient w/ recent RYGB surgery following dietary guidelines for continued weight loss.     Intervention:  Nutrition counseling provided.  Teaching Method Utilized:  Visual Auditory Hands on  Barriers  to learning/adherence to lifestyle change: knee pain  Demonstrated degree of understanding via:  Teach Back   Monitoring/Evaluation:  Dietary intake, exercise, and body weight. Follow up in 6 months for 10 month post-op visit.

## 2014-12-29 NOTE — Patient Instructions (Addendum)
Goals:  Follow Phase 3B: High Protein + Non-Starchy Vegetables  Eat 3-6 small meals/snacks, every 3-5 hrs  Increase lean protein foods to meet 80g goal  Increase fluid intake to 64oz +  Avoid drinking 15 minutes before, during and 30 minutes after eating  Aim for >30 min of physical activity daily  TANITA  BODY COMP RESULTS  07/14/14 09/16/14 10/28/14 12/29/14   BMI (kg/m^2) 57.2 51.8 48.3 43.9   Fat Mass (lbs) 125.5 113.0 84.5 67   Fat Free Mass (lbs) 308 280.0 281.5 266   Total Body Water (lbs) 225.5 205.0 206 194.5

## 2014-12-30 ENCOUNTER — Ambulatory Visit (HOSPITAL_BASED_OUTPATIENT_CLINIC_OR_DEPARTMENT_OTHER)
Admission: RE | Admit: 2014-12-30 | Discharge: 2014-12-30 | Disposition: A | Discharge status: Home / Self Care | Payer: BLUE CROSS/BLUE SHIELD | Source: Ambulatory Visit | Attending: Family Medicine | Admitting: Family Medicine

## 2014-12-30 ENCOUNTER — Encounter: Payer: Self-pay | Admitting: Dietician

## 2014-12-30 ENCOUNTER — Encounter: Payer: Self-pay | Admitting: Family Medicine

## 2014-12-30 ENCOUNTER — Ambulatory Visit (INDEPENDENT_AMBULATORY_CARE_PROVIDER_SITE_OTHER): Payer: BLUE CROSS/BLUE SHIELD | Admitting: Family Medicine

## 2014-12-30 VITALS — BP 114/72 | HR 74 | Temp 98.4°F | Ht 73.0 in | Wt 332.2 lb

## 2014-12-30 DIAGNOSIS — E1169 Type 2 diabetes mellitus with other specified complication: Secondary | ICD-10-CM

## 2014-12-30 DIAGNOSIS — M7989 Other specified soft tissue disorders: Secondary | ICD-10-CM

## 2014-12-30 DIAGNOSIS — I10 Essential (primary) hypertension: Secondary | ICD-10-CM

## 2014-12-30 DIAGNOSIS — E669 Obesity, unspecified: Secondary | ICD-10-CM

## 2014-12-30 DIAGNOSIS — M79644 Pain in right finger(s): Secondary | ICD-10-CM | POA: Insufficient documentation

## 2014-12-30 DIAGNOSIS — E119 Type 2 diabetes mellitus without complications: Secondary | ICD-10-CM | POA: Diagnosis not present

## 2014-12-30 DIAGNOSIS — M79641 Pain in right hand: Secondary | ICD-10-CM

## 2014-12-30 LAB — RHEUMATOID FACTOR: Rhuematoid fact SerPl-aCnc: <10 IU/mL (ref <=14)

## 2014-12-30 MED ORDER — METHYLPREDNISOLONE 4 MG PO TABS: ORAL_TABLET | ORAL | Status: DC

## 2014-12-30 NOTE — Patient Instructions (Signed)
Gout Gout is an inflammatory arthritis caused by a buildup of uric acid crystals in the joints. Uric acid is a chemical that is normally present in the blood. When the level of uric acid in the blood is too high it can form crystals that deposit in your joints and tissues. This causes joint redness, soreness, and swelling (inflammation). Repeat attacks are common. Over time, uric acid crystals can form into masses (tophi) near a joint, destroying bone and causing disfigurement. Gout is treatable and often preventable. CAUSES  The disease begins with elevated levels of uric acid in the blood. Uric acid is produced by your body when it breaks down a naturally found substance called purines. Certain foods you eat, such as meats and fish, contain high amounts of purines. Causes of an elevated uric acid level include:  Being passed down from parent to child (heredity).  Diseases that cause increased uric acid production (such as obesity, psoriasis, and certain cancers).  Excessive alcohol use.  Diet, especially diets rich in meat and seafood.  Medicines, including certain cancer-fighting medicines (chemotherapy), water pills (diuretics), and aspirin.  Chronic kidney disease. The kidneys are no longer able to remove uric acid well.  Problems with metabolism. Conditions strongly associated with gout include:  Obesity.  High blood pressure.  High cholesterol.  Diabetes. Not everyone with elevated uric acid levels gets gout. It is not understood why some people get gout and others do not. Surgery, joint injury, and eating too much of certain foods are some of the factors that can lead to gout attacks. SYMPTOMS   An attack of gout comes on quickly. It causes intense pain with redness, swelling, and warmth in a joint.  Fever can occur.  Often, only one joint is involved. Certain joints are more commonly involved:  Base of the big toe.  Knee.  Ankle.  Wrist.  Finger. Without  treatment, an attack usually goes away in a few days to weeks. Between attacks, you usually will not have symptoms, which is different from many other forms of arthritis. DIAGNOSIS  Your caregiver will suspect gout based on your symptoms and exam. In some cases, tests may be recommended. The tests may include:  Blood tests.  Urine tests.  X-rays.  Joint fluid exam. This exam requires a needle to remove fluid from the joint (arthrocentesis). Using a microscope, gout is confirmed when uric acid crystals are seen in the joint fluid. TREATMENT  There are two phases to gout treatment: treating the sudden onset (acute) attack and preventing attacks (prophylaxis).  Treatment of an Acute Attack.  Medicines are used. These include anti-inflammatory medicines or steroid medicines.  An injection of steroid medicine into the affected joint is sometimes necessary.  The painful joint is rested. Movement can worsen the arthritis.  You may use warm or cold treatments on painful joints, depending which works best for you.  Treatment to Prevent Attacks.  If you suffer from frequent gout attacks, your caregiver may advise preventive medicine. These medicines are started after the acute attack subsides. These medicines either help your kidneys eliminate uric acid from your body or decrease your uric acid production. You may need to stay on these medicines for a very long time.  The early phase of treatment with preventive medicine can be associated with an increase in acute gout attacks. For this reason, during the first few months of treatment, your caregiver may also advise you to take medicines usually used for acute gout treatment. Be sure you   understand your caregiver's directions. Your caregiver may make several adjustments to your medicine dose before these medicines are effective.  Discuss dietary treatment with your caregiver or dietitian. Alcohol and drinks high in sugar and fructose and foods  such as meat, poultry, and seafood can increase uric acid levels. Your caregiver or dietitian can advise you on drinks and foods that should be limited. HOME CARE INSTRUCTIONS   Do not take aspirin to relieve pain. This raises uric acid levels.  Only take over-the-counter or prescription medicines for pain, discomfort, or fever as directed by your caregiver.  Rest the joint as much as possible. When in bed, keep sheets and blankets off painful areas.  Keep the affected joint raised (elevated).  Apply warm or cold treatments to painful joints. Use of warm or cold treatments depends on which works best for you.  Use crutches if the painful joint is in your leg.  Drink enough fluids to keep your urine clear or pale yellow. This helps your body get rid of uric acid. Limit alcohol, sugary drinks, and fructose drinks.  Follow your dietary instructions. Pay careful attention to the amount of protein you eat. Your daily diet should emphasize fruits, vegetables, whole grains, and fat-free or low-fat milk products. Discuss the use of coffee, vitamin C, and cherries with your caregiver or dietitian. These may be helpful in lowering uric acid levels.  Maintain a healthy body weight. SEEK MEDICAL CARE IF:   You develop diarrhea, vomiting, or any side effects from medicines.  You do not feel better in 24 hours, or you are getting worse. SEEK IMMEDIATE MEDICAL CARE IF:   Your joint becomes suddenly more tender, and you have chills or a fever. MAKE SURE YOU:   Understand these instructions.  Will watch your condition.  Will get help right away if you are not doing well or get worse.   This information is not intended to replace advice given to you by your health care provider. Make sure you discuss any questions you have with your health care provider.   Document Released: 03/04/2000 Document Revised: 03/28/2014 Document Reviewed: 10/19/2011 Elsevier Interactive Patient Education 2016  [employer] Polymyalgia Rheumatica Polymyalgia rheumatica (also called PMR or polymyalgia) is a rheumatologic (arthritic) condition that causes pain and morning stiffness in your neck, shoulders, and hips. It is an inflammatory condition. In some people, inflammation of certain structures in the shoulder, hips, or other joints can be seen on special testing. It does not cause joint destruction, as occurs in other arthritic conditions. It usually occurs after 52 years of age, and is more common as you age. It can be confused with several other diseases, but it is usually easily treated. People with PMR often have, or can develop, a more severe rheumatologic condition called giant cell arteritis (also called CGA or temporal arteritis).  CAUSES  The exact cause of PMR is not known.   There are genetic factors involved.  Viruses have been suspected in the cause of PMR. This has not been proven. SYMPTOMS   Aching, pain, and morning stiffness your neck, both shoulders, or both hips.  Symptoms usually start slowly and build gradually.  Morning stiffness usually lasts at least 30 minutes.  Swelling and tenderness in other joints of the arms, hands, legs, and feet may occur.  Swelling and inflammation in the wrists can cause nerve inflammation at the wrist (carpal tunnel syndrome).  You may also have low grade fever, fatigue, weakness, decreased appetite and weight loss.  DIAGNOSIS   Your caregiver may suspect that you have PMR based on your description of your symptoms and on your exam.  Your caregiver will examine you to be sure you do not have diseases that can be confused with PMR. These diseases include rheumatoid arthritis, fibromyalgia, or thyroid disease.  Your caregiver should check for signs of giant cell arteritis. This can cause serious complications such as blindness.  Lab tests can help confirm that you have PMR and not other diseases, but are sometimes inconclusive.  X-rays  cannot show PMR. However, it can identify other diseases like rheumatoid arthritis. Your caregiver may have you see a specialist in arthritis and inflammatory diseases (rheumatologist). TREATMENT  The goal of treatment is relief of symptoms. Treatment does not shorten the course of the illness or prevent complications. With proper treatment, you usually feel better almost right away.   The initial treatment of PMR is usually a cortisone (steroid) medication. Your caregiver will help determine a starting dose. The dose is gradually reduced every few weeks to months. Treatment usually lasts one to three years.  Other stronger medications are rarely needed. They will only be prescribed if your symptoms do not get better on cortisone medication alone, or if they recur as the dose is reduced.  Cortisone medication can have different side effects. With the doses of cortisone needed for PMR, the side effects can affect bones and joints, blood sugar control in diabetes, and mood changes. Discuss this with your caregiver.  Your caregiver will evaluate you regularly during your treatment. They will do this in order to assess progress and to check for complications of the illness or treatment.  Physical therapy is sometimes useful. This is especially true if your joints are still stiff after other symptoms have improved. HOME CARE INSTRUCTIONS   Follow your caregiver's instructions. Do not change your dose of cortisone medication on your own.  Keep your appointments for follow-up lab tests and caregiver visits. Your lab tests need to be monitored. You must get checked periodically for giant cell arteritis.  Follow your caregiver's guidance regarding physical activity (usually no restrictions are needed) or physical therapy.  Your caregiver may have instructions to prevent or check for side effects from cortisone medication (including bone density testing or treatment). Follow their instructions  carefully. SEEK MEDICAL CARE IF:   You develop any side effects from treatment. Side effects can include:  Elevated blood pressure.  High blood sugar (or worsening of diabetes, if you are diabetic).  Difficulty fighting off infections.  Weight gain.  Weakness of the bones (osteoporosis).  Your aches, pains, morning stiffness, or other symptoms get worse with time. This is especially true after your dose of cortisone is reduced.  You develop new joint symptoms (pain, swelling, etc.) SEEK IMMEDIATE MEDICAL CARE IF:   You develop a severe headache.  You start vomiting.  You have problems with your vision.  You have an oral temperature above 102 F (38.9 C), not controlled by medicine.   This information is not intended to replace advice given to you by your health care provider. Make sure you discuss any questions you have with your health care provider.   Document Released: 04/14/2004 Document Revised: 02/22/2012 Document Reviewed: 09/17/2014 Elsevier Interactive Patient Education [employer]

## 2014-12-30 NOTE — Progress Notes (Signed)
Pre visit review using our clinic review tool, if applicable. No additional management support is needed unless otherwise documented below in the visit note. 

## 2014-12-31 LAB — CBC
HEMATOCRIT: 39.1 % (ref 39.0–52.0)
Hemoglobin: 12.8 g/dL — ABNORMAL LOW (ref 13.0–17.0)
MCHC: 32.7 g/dL (ref 30.0–36.0)
MCV: 90.4 fl (ref 78.0–100.0)
Platelets: 324 10*3/uL (ref 150.0–400.0)
RBC: 4.32 Mil/uL (ref 4.22–5.81)
RDW: 16.5 % — AB (ref 11.5–15.5)
WBC: 7.2 10*3/uL (ref 4.0–10.5)

## 2014-12-31 LAB — URIC ACID: Uric Acid, Serum: 4.9 mg/dL (ref 4.0–7.8)

## 2014-12-31 LAB — SEDIMENTATION RATE: Sed Rate: 29 mm/hr — ABNORMAL HIGH (ref 0–22)

## 2015-01-04 ENCOUNTER — Encounter: Payer: Self-pay | Admitting: Family Medicine

## 2015-01-04 DIAGNOSIS — M79641 Pain in right hand: Secondary | ICD-10-CM | POA: Insufficient documentation

## 2015-01-04 NOTE — Assessment & Plan Note (Signed)
Notable swelling of right thumb, uric acid normal. No trauma. Labs unremarkable. Will try topical treatments and Tylenol prn. Report if worsens

## 2015-01-04 NOTE — Assessment & Plan Note (Signed)
Encouraged DASH diet, decrease po intake and increase exercise as tolerated. Needs 7-8 hours of sleep nightly. Avoid trans fats, eat small, frequent meals every 4-5 hours with lean proteins, complex carbs and healthy fats. Minimize simple carbs, GMO foods. 

## 2015-01-04 NOTE — Assessment & Plan Note (Signed)
Well controlled, no changes to meds. Encouraged heart healthy diet such as the DASH diet and exercise as tolerated.  °

## 2015-01-04 NOTE — Assessment & Plan Note (Signed)
hgba1c acceptable, minimize simple carbs. Increase exercise as tolerated. Continue current meds 

## 2015-01-04 NOTE — Progress Notes (Signed)
Subjective:    Patient ID: Jeffrey Molina, male    DOB: Nov 11, 1960, 54 y.o.   MRN: [id number]  Chief Complaint  Patient presents with  . Hand Pain    right thumb swollen/painful.    HPI Patient is in today for evaluation of swelling and pain in right hand. He's had trouble this past week and actually today the pain is improved over the last few days. The pain is most notable in his thumb although his other fingers in the right hand have had some symptoms. It is worse at bedtime and in the morning. No redness or warmth. No trauma. No polyuria or polydipsia. No change in exercise diet or fluid intake lately. Denies CP/palp/SOB/HA/congestion/fevers/GI or GU c/o. Taking meds as prescribed  Past Medical History  Diagnosis Date  . Hypertension   . Chicken pox as a child  . Low testosterone 10/28/2013  . Cutaneous skin tags 10/28/2013  . Peripheral artery disease ([place]) 11/03/2013  . Diabetes mellitus type 2 in obese ([place]) 01/27/2014  . Tachycardia 05/15/2014  . Sleep apnea     has c-pap machine  . GERD (gastroesophageal reflux disease)   . Arthritis     both knees  . Anemia 10/28/2013    pt. stated "no" at preop appt. 08-29-14    Past Surgical History  Procedure Laterality Date  . Colonscopy      x 2  . 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 Fibromyalgia Mother   . Cancer Father     skin cancer  . Cataracts Father   . Arthritis Father   . Diabetes Maternal Grandfather     Social History   Social History  . Marital Status: Married    Spouse Name: N/A  . Number of Children: N/A  . Years of Education: N/A   Occupational History  . Manager [employer]   Social History Main Topics  . Smoking status: Never Smoker   . Smokeless tobacco: Never Used  . Alcohol Use: No      Comment: very rare. special occasions  . Drug Use: No  . Sexual Activity: Yes     Comment: lives with wife and children, works for [employer]   Other Topics Concern  . Not on file   Social History Narrative    Outpatient Prescriptions Prior to Visit  Medication Sig Dispense Refill  . Calcium Carb-Cholecalciferol (CALCIUM 600 + D PO) Take 1 tablet by mouth 3 (three) times daily.    . Methylcobalamin (B-12) 5000 MCG TBDP Take 1 tablet by mouth daily.    . metoprolol tartrate (LOPRESSOR) 25 MG tablet Take 0.5 tablets (12.5 mg total) by mouth 2 (two) times daily. 60 tablet 1  . Multiple Vitamin (MULTIVITAMIN WITH MINERALS) TABS tablet Take 1 tablet by mouth 2 (two) times daily.    . Probiotic Product (PROBIOTIC & ACIDOPHILUS EX ST PO) Take by mouth.    . ciprofloxacin (CIPRO) 500 MG tablet Take 1 tablet (500 mg total) by mouth 2 (two) times daily. 10 tablet 0  . metroNIDAZOLE (FLAGYL) 500 MG tablet Take 500 mg by mouth 3 (three) times daily.  0  . ranitidine (ZANTAC) 150 MG tablet Take 150 mg by mouth 2 (two) times daily.  0  . traMADol (ULTRAM)  50 MG tablet Take 1 tablet (50 mg total) by mouth 3 (three) times daily as needed for moderate pain or severe pain. (Patient not taking: Reported on 12/30/2014) 40 tablet 0   No facility-administered medications prior to visit.    Allergies  Allergen Reactions  . Lisinopril-Hydrochlorothiazide Swelling    Lip swollen    Review of Systems  Constitutional: Negative for fever and malaise/fatigue.  HENT: Negative for congestion.   Eyes: Negative for discharge.  Respiratory: Negative for shortness of breath.   Cardiovascular: Negative for chest pain, palpitations and leg swelling.  Gastrointestinal: Negative for nausea and abdominal pain.  Genitourinary: Negative for dysuria.  Musculoskeletal: Positive for joint pain. Negative for falls.  Skin: Negative for rash.  Neurological: Negative for loss of consciousness and  headaches.  Endo/Heme/Allergies: Negative for environmental allergies.  Psychiatric/Behavioral: Negative for depression. The patient is not nervous/anxious.        Objective:    Physical Exam  Constitutional: He is oriented to person, place, and time. He appears well-developed and well-nourished. No distress.  HENT:  Head: Normocephalic and atraumatic.  Nose: Nose normal.  Eyes: Right eye exhibits no discharge. Left eye exhibits no discharge.  Neck: Normal range of motion. Neck supple.  Cardiovascular: Normal rate and regular rhythm.   No murmur heard. Pulmonary/Chest: Effort normal and breath sounds normal.  Abdominal: Soft. Bowel sounds are normal. There is no tenderness.  Musculoskeletal: He exhibits edema.  Right thumb is swollen diffusely but no erythema or warmth. Fingers on right hand are slightly larger thatn on left.  Neurological: He is alert and oriented to person, place, and time.  Skin: Skin is warm and dry.  Psychiatric: He has a normal mood and affect.  Nursing note and vitals reviewed.   BP 114/72 mmHg  Pulse 74  Temp(Src) 98.4 F (36.9 C) (Oral)  Ht _0  (1.854 m)  Wt 332 lb 4 oz (150.708 kg)  BMI 43.84 kg/m2  SpO2 96% Wt Readings from Last 3 Encounters:  12/30/14 332 lb 4 oz (150.708 kg)  12/30/14 333 lb (151.048 kg)  12/12/14 326 lb 6 oz (148.043 kg)     Lab Results  Component Value Date   WBC 7.2 12/30/2014   HGB 12.8* 12/30/2014   HCT 39.1 12/30/2014   PLT 324.0 12/30/2014   GLUCOSE 110* 12/12/2014   CHOL 172 07/07/2014   TRIG 177.0* 07/07/2014   HDL 29.70* 07/07/2014   LDLCALC 107* 07/07/2014   ALT 14 12/12/2014   AST 15 12/12/2014   NA 138 12/12/2014   K 3.4* 12/12/2014   CL 99 12/12/2014   CREATININE 1.02 12/12/2014   BUN 11 12/12/2014   CO2 31 12/12/2014   TSH 5.05* 07/07/2014   HGBA1C 6.3 07/07/2014   MICROALBUR 1.2 11/07/2014    Lab Results  Component Value Date   TSH 5.05* 07/07/2014   Lab Results  Component Value  Date   WBC 7.2 12/30/2014   HGB 12.8* 12/30/2014   HCT 39.1 12/30/2014   MCV 90.4 12/30/2014   PLT 324.0 12/30/2014   Lab Results  Component Value Date   NA 138 12/12/2014   K 3.4* 12/12/2014   CO2 31 12/12/2014   GLUCOSE 110* 12/12/2014   BUN 11 12/12/2014   CREATININE 1.02 12/12/2014   BILITOT 0.5 12/12/2014   ALKPHOS 46 12/12/2014   AST 15 12/12/2014   ALT 14 12/12/2014   PROT 7.2 12/12/2014   ALBUMIN 3.6 12/12/2014   CALCIUM 9.1 12/12/2014   ANIONGAP  10 09/04/2014   GFR 81.33 12/12/2014   Lab Results  Component Value Date   CHOL 172 07/07/2014   Lab Results  Component Value Date   HDL 29.70* 07/07/2014   Lab Results  Component Value Date   LDLCALC 107* 07/07/2014   Lab Results  Component Value Date   TRIG 177.0* 07/07/2014   Lab Results  Component Value Date   CHOLHDL 6 07/07/2014   Lab Results  Component Value Date   HGBA1C 6.3 07/07/2014       Assessment & Plan:   Problem List Items Addressed This Visit    Pain of right hand - Primary    Notable swelling of right thumb, uric acid normal. No trauma. Labs unremarkable. Will try topical treatments and Tylenol prn. Report if worsens      Relevant Medications   methylPREDNISolone (MEDROL) 4 MG tablet   Other Relevant Orders   CBC (Completed)   Sed Rate (ESR) (Completed)   Rheumatoid Factor (Completed)   Uric acid (Completed)   DG Hand Complete Right (Completed)   Morbid obesity ([facility]) (Chronic)    Encouraged DASH diet, decrease po intake and increase exercise as tolerated. Needs 7-8 hours of sleep nightly. Avoid trans fats, eat small, frequent meals every 4-5 hours with lean proteins, complex carbs and healthy fats. Minimize simple carbs, GMO foods.      HTN (hypertension)    Well controlled, no changes to meds. Encouraged heart healthy diet such as the DASH diet and exercise as tolerated.       Diabetes mellitus type 2 in obese ([facility])    hgba1c acceptable, minimize simple carbs. Increase  exercise as tolerated. Continue current meds       Other Visit Diagnoses    Swelling of right hand        Relevant Medications    methylPREDNISolone (MEDROL) 4 MG tablet    Other Relevant Orders    CBC (Completed)    Sed Rate (ESR) (Completed)    Rheumatoid Factor (Completed)    Uric acid (Completed)    DG Hand Complete Right (Completed)       I have discontinued Mr. Spiker's metroNIDAZOLE, ranitidine, and ciprofloxacin. I am also having him start on methylPREDNISolone. Additionally, I am having him maintain his B-12, Calcium Carb-Cholecalciferol (CALCIUM 600 + D PO), multivitamin with minerals, metoprolol tartrate, Probiotic Product (PROBIOTIC & ACIDOPHILUS EX ST PO), and traMADol.  Meds ordered this encounter  Medications  . methylPREDNISolone (MEDROL) 4 MG tablet    Sig: 6 tabs po x 1 d then 5 tabs po qd x 1 d then 4 tabs po x 1 d then 3 tabs po x 1d then 2 tabs po x 1 d then 1 tab po x 1 d then stop    Dispense:  21 tablet    Refill:  1     Manju Kulkarni, MD

## 2015-02-02 ENCOUNTER — Encounter: Payer: Self-pay | Admitting: Family Medicine

## 2015-02-03 ENCOUNTER — Other Ambulatory Visit: Payer: Self-pay | Admitting: Family Medicine

## 2015-02-03 MED ORDER — METOPROLOL TARTRATE 25 MG PO TABS: 12.5000 mg | ORAL_TABLET | Freq: Two times a day (BID) | ORAL | Status: DC

## 2015-02-17 ENCOUNTER — Telehealth: Payer: Self-pay | Admitting: Family Medicine

## 2015-02-17 NOTE — Telephone Encounter (Signed)
Called patient to schedule Flu Shot shot 02/17/2015. Patient declined because he had his Flu Shot at work.

## 2015-02-19 ENCOUNTER — Ambulatory Visit (INDEPENDENT_AMBULATORY_CARE_PROVIDER_SITE_OTHER): Payer: BLUE CROSS/BLUE SHIELD | Admitting: Family Medicine

## 2015-02-19 ENCOUNTER — Encounter: Payer: Self-pay | Admitting: Family Medicine

## 2015-02-19 ENCOUNTER — Ambulatory Visit (HOSPITAL_BASED_OUTPATIENT_CLINIC_OR_DEPARTMENT_OTHER)
Admission: RE | Admit: 2015-02-19 | Discharge: 2015-02-19 | Disposition: A | Discharge status: Home / Self Care | Payer: BLUE CROSS/BLUE SHIELD | Source: Ambulatory Visit | Attending: Family Medicine | Admitting: Family Medicine

## 2015-02-19 VITALS — BP 118/74 | HR 68 | Temp 98.6°F | Ht 73.0 in | Wt 319.1 lb

## 2015-02-19 DIAGNOSIS — Z302 Encounter for sterilization: Secondary | ICD-10-CM

## 2015-02-19 DIAGNOSIS — R000 Tachycardia, unspecified: Secondary | ICD-10-CM

## 2015-02-19 DIAGNOSIS — M899 Disorder of bone, unspecified: Secondary | ICD-10-CM

## 2015-02-19 DIAGNOSIS — M898X8 Other specified disorders of bone, other site: Secondary | ICD-10-CM

## 2015-02-19 DIAGNOSIS — I10 Essential (primary) hypertension: Secondary | ICD-10-CM

## 2015-02-19 NOTE — Progress Notes (Signed)
Pre visit review using our clinic review tool, if applicable. No additional management support is needed unless otherwise documented below in the visit note. 

## 2015-02-19 NOTE — Progress Notes (Signed)
Jeffrey Molina [id number] 1960-10-21 02/19/2015      Patient Progress Note   Subjective  Chief Complaint  Chief Complaint  Patient presents with  . Cyst    HPI  54 year old male presents with a "knot in his chest" that he began to feel two weeks ago. It has not been painful. He has noticed small changes in size, and thinks it may be smaller in size today compared to two weeks. It has always been slightly soft and "doughy". He has never had anything like this before. He had the roux-en-y surgery in June and has recovered well from it. He is also content with the results from it (lost 120 lbs). Mass is rounded and continuous with sternum. Patient denies shortness of breath, chest pain,changes in urination, GI issues, recent fevers or illnesses    Past Medical History  Diagnosis Date  . Hypertension   . Chicken pox as a child  . Low testosterone 10/28/2013  . Cutaneous skin tags 10/28/2013  . Peripheral artery disease ([facility]) 11/03/2013  . Diabetes mellitus type 2 in obese ([facility]) 01/27/2014  . Tachycardia 05/15/2014  . Sleep apnea     has c-pap machine  . GERD (gastroesophageal reflux disease)   . Arthritis     both knees  . Anemia 10/28/2013    pt. stated "no" at preop appt. 08-29-14    Past Surgical History  Procedure Laterality Date  . Colonscopy      x 2  . 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 Fibromyalgia Mother   . Cancer Father     skin cancer  . Cataracts Father   . Arthritis Father   . Diabetes Maternal Grandfather     Social History   Social History  . Marital Status: Married    Spouse Name: N/A  . Number of Children: N/A  . Years of Education: N/A   Occupational History  . Manager [employer]   Social History Main Topics  . Smoking status:  Never Smoker   . Smokeless tobacco: Never Used  . Alcohol Use: No     Comment: very rare. special occasions  . Drug Use: No  . Sexual Activity: Yes     Comment: lives with wife and children, works for [employer]   Other Topics Concern  . Not on file   Social History Narrative    Current Outpatient Prescriptions on File Prior to Visit  Medication Sig Dispense Refill  . Calcium Carb-Cholecalciferol (CALCIUM 600 + D PO) Take 1 tablet by mouth 3 (three) times daily.    . Methylcobalamin (B-12) 5000 MCG TBDP Take 1 tablet by mouth daily.    . methylPREDNISolone (MEDROL) 4 MG tablet 6 tabs po x 1 d then 5 tabs po qd x 1 d then 4 tabs po x 1 d then 3 tabs po x 1d then 2 tabs po x 1 d then 1 tab po x 1 d then stop 21 tablet 1  . metoprolol tartrate (LOPRESSOR) 25 MG tablet Take 0.5 tablets (12.5 mg total) by mouth 2 (two) times daily. 60 tablet 8  . Multiple Vitamin (MULTIVITAMIN WITH MINERALS) TABS tablet Take 1 tablet by mouth 2 (two) times daily.    . Probiotic Product (PROBIOTIC & ACIDOPHILUS EX  ST PO) Take by mouth.    . traMADol (ULTRAM) 50 MG tablet Take 1 tablet (50 mg total) by mouth 3 (three) times daily as needed for moderate pain or severe pain. 40 tablet 0   No current facility-administered medications on file prior to visit.    Allergies  Allergen Reactions  . Lisinopril-Hydrochlorothiazide Swelling    Lip swollen    Review of Systems   Constitutional: Negative for fever and malaise/fatigue.  HENT: Negative for congestion.  Eyes: Negative for discharge.  Respiratory: Negative for shortness of breath.  Cardiovascular: Negative for chest pain, palpitations and leg swelling.  Gastrointestinal: Negative for nausea, abdominal pain and diarrhea.  Genitourinary: Negative for dysuria and urgency, hematuria and flank pain.  Musculoskeletal: Negative for myalgias and falls.  Skin: Negative for rash.  Neurological: Negative for loss of consciousness and  headaches.  Endo/Heme/Allergies: Negative for polydipsia.  Psychiatric/Behavioral: Negative for depression and suicidal ideas. The patient is not nervous/anxious and does not have insomnia.   Objective  BP 118/74 mmHg  Pulse 68  Temp(Src) 98.6 F (37 C) (Oral)  Ht 6\' 1"  (1.854 m)  Wt 319 lb 2 oz (144.754 kg)  BMI 42.11 kg/m2  SpO2 97%  Physical Exam   Constitutional: Oriented to person, place, and time.Appears well-nourished. No distress.  Head: Normocephalic, atraumatic Eyes: EOM are normal. Pupils are equal, round, and reactive to light.  Ears: tympanic membrane clears Cardiovascular: Normal rate and regular rhythm.  Pulmonary/Chest: Breath sounds normal.  Hard knot palpable at right inferior border of sternum.  Abdominal: Soft. Bowel sounds are normal.  Lymphadenopathy:   No cervical adenopathy.  Neurological: Alert and oriented to person, place, and time. Has normal reflexes. No cranial nerve deficit. No abnormal affect   Assessment & Plan  Chest Mass -Same consistency as adjacent sternum -Most likely continuous with sternum and not noticed until after weight loss surgery -Will monitor for growth/changes -Chest x ray

## 2015-02-19 NOTE — Patient Instructions (Addendum)
Vasectomy    A vasectomy is tying (with or without cutting) the tube that collects the sperm from the testicle (vas deferens). The vasectomy blocks the sperm from going through the vas deferens and penis so that during sexual intercourse, the sperm does not go into the vagina. Vasectomy is safe, with very rare complications. It does not affect your sexual desire or performance. A vasectomy does not prevent sexually transmitted diseases.  Because vasectomy is considered permanent, you should not have it done until you are sure you do not want any more children. You and your partner should be in full agreement to have the procedure. Your decision to have a vasectomy should not be made during a stressful situation. This includes loss of a pregnancy, illness, death of a spouse, or divorce. There are other means of contraception that can be used until you are completely sure you want this procedure done.    LET YOUR HEALTH CARE PROVIDER KNOW ABOUT:    · Any allergies you have.  · All medicines you are taking, including vitamins, herbs, eye drops, creams, and over-the-counter medicines.  · Previous problems you or members of your family have had with the use of anesthetics.  · Any blood disorders you have.  · Previous surgeries you have had.  · Medical conditions you have.  RISKS AND COMPLICATIONS  Generally, vasectomy is a safe procedure. However, as with any procedure, complications can occur. Possible complications include:  · Failure of the procedure to cause infertility. This means you would still be able to get a male pregnant. Even after sterilization has been achieved, there is a 1 in 10,000 chance that the two cut ends may reconnect (recanalization).   · Infection. A germ starts growing in the wound. This can usually be treated with antibiotic medicine(s).  · An allergic reaction to the anesthetic or other medicine given.  · Bleeding. Blood may seep under the skin so that the scrotum and penis appear to be  bruised. Sometimes the scrotum can swell and get the size of a grapefruit. This usually disappears without treatment within a week or two.  BEFORE THE PROCEDURE  · Do not take aspirin or aspirin-containing products for 7 days prior to your procedure.  · Do not take nonsteroidal anti-inflammatory products for 7 days prior to your procedure.  · You may be instructed to wash with soap before coming in for your procedure.  PROCEDURE  · The scrotum is cleaned with bacteria-killing soap, and the health care provider finds the vas deferens.  · Each side of the scrotum is numbed.  · A very small cut (incision) is made, and the vas deferens are pulled out of the scrotum. The vas deferens are then tied off, cut, or may be burned (cauterized) at the ends.  · Sometimes the vas deferens are pulled out from the scrotum through a puncture wound. This is done with a special instrument without an incision.  · The vas deferens are then put back into the scrotum, and the incision or puncture wound is closed. Absorbable suture material that will dissolve and not need to be removed is commonly used.  · After surgery, sperm may still be left in the vas deferens for 1-3 months. Because of this, other means of contraception should be used until your health care provider examines you and finds there are no sperm in your seminal fluid.  AFTER THE PROCEDURE  After the procedure, you will be taken to the recovery area. Your progress will be   watched and checked. Once you are awake, stable, and taking fluids well, you will be allowed to go home as long as there are no problems.       This information is not intended to replace advice given to you by your health care provider. Make sure you discuss any questions you have with your health care provider.     Document Released: 05/28/2002 Document Revised: 03/12/2013 Document Reviewed: 09/24/2012  [employer]vier Interactive Patient Education ©2016 [employer]   

## 2015-02-22 ENCOUNTER — Encounter: Payer: Self-pay | Admitting: Family Medicine

## 2015-02-22 DIAGNOSIS — M898X8 Other specified disorders of bone, other site: Secondary | ICD-10-CM | POA: Insufficient documentation

## 2015-02-22 NOTE — Progress Notes (Signed)
Subjective:    Patient ID: Jeffrey Molina, male    DOB: 1960-09-24, 54 y.o.   MRN: [id number]  Chief Complaint  Patient presents with  . Cyst    HPI Patient is in today for evaluation of not on his chest. It is nontender but he does note a prominent at the base of his sternum which he thinks his new endocrinologist is likely notable because of his weight loss. He's had no injury or fall. He denies any back pain. He has no GI or GU complaints and otherwise feels well. Denies CP/palp/SOB/HA/congestion/fevers/GI or GU c/o. Taking meds as prescribed  Past Medical History  Diagnosis Date  . Hypertension   . Chicken pox as a child  . Low testosterone 10/28/2013  . Cutaneous skin tags 10/28/2013  . Peripheral artery disease (HCC) 11/03/2013  . Diabetes mellitus type 2 in obese (HCC) 01/27/2014  . Tachycardia 05/15/2014  . Sleep apnea     has c-pap machine  . GERD (gastroesophageal reflux disease)   . Arthritis     both knees  . Anemia 10/28/2013    pt. stated "no" at preop appt. 08-29-14    Past Surgical History  Procedure Laterality Date  . Colonscopy      x 2  . 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 Fibromyalgia Mother   . Cancer Father     skin cancer  . Cataracts Father   . Arthritis Father   . Diabetes Maternal Grandfather     Social History   Social History  . Marital Status: Married    Spouse Name: N/A  . Number of Children: N/A  . Years of Education: N/A   Occupational History  . Manager [employer]   Social History Main Topics  . Smoking status: Never Smoker   . Smokeless tobacco: Never Used  . Alcohol Use: No     Comment: very rare. special occasions  . Drug Use: No  . Sexual Activity: Yes     Comment: lives with wife and children, works for  [employer]   Other Topics Concern  . Not on file   Social History Narrative    Outpatient Prescriptions Prior to Visit  Medication Sig Dispense Refill  . Calcium Carb-Cholecalciferol (CALCIUM 600 + D PO) Take 1 tablet by mouth 3 (three) times daily.    . Methylcobalamin (B-12) 5000 MCG TBDP Take 1 tablet by mouth daily.    . methylPREDNISolone (MEDROL) 4 MG tablet 6 tabs po x 1 d then 5 tabs po qd x 1 d then 4 tabs po x 1 d then 3 tabs po x 1d then 2 tabs po x 1 d then 1 tab po x 1 d then stop 21 tablet 1  . metoprolol tartrate (LOPRESSOR) 25 MG tablet Take 0.5 tablets (12.5 mg total) by mouth 2 (two) times daily. 60 tablet 8  . Multiple Vitamin (MULTIVITAMIN WITH MINERALS) TABS tablet Take 1 tablet by mouth 2 (two) times daily.    . Probiotic Product (PROBIOTIC & ACIDOPHILUS EX ST PO) Take by mouth.    . traMADol (ULTRAM) 50 MG tablet Take 1 tablet (50 mg total) by mouth 3 (three) times daily as needed for moderate pain or severe pain. 40 tablet 0   No  facility-administered medications prior to visit.    Allergies  Allergen Reactions  . Lisinopril-Hydrochlorothiazide Swelling    Lip swollen    Review of Systems  Constitutional: Negative for fever and malaise/fatigue.  HENT: Negative for congestion.   Eyes: Negative for discharge.  Respiratory: Negative for shortness of breath.   Cardiovascular: Negative for chest pain, palpitations and leg swelling.  Gastrointestinal: Negative for nausea and abdominal pain.  Genitourinary: Negative for dysuria.  Musculoskeletal: Negative for falls.  Skin: Negative for rash.  Neurological: Negative for loss of consciousness and headaches.  Endo/Heme/Allergies: Negative for environmental allergies.  Psychiatric/Behavioral: Negative for depression. The patient is not nervous/anxious.        Objective:    Physical Exam  Constitutional: He is oriented to person, place, and time. He appears well-developed and well-nourished. No  distress.  HENT:  Head: Normocephalic and atraumatic.  Nose: Nose normal.  Eyes: Right eye exhibits no discharge. Left eye exhibits no discharge.  Neck: Normal range of motion. Neck supple.  Cardiovascular: Normal rate and regular rhythm.   No murmur heard. Pulmonary/Chest: Effort normal and breath sounds normal.  Abdominal: Soft. Bowel sounds are normal. There is no tenderness.  Musculoskeletal: He exhibits no edema.  Base of sternum is prominent, nontender  Neurological: He is alert and oriented to person, place, and time.  Skin: Skin is warm and dry.  Psychiatric: He has a normal mood and affect.  Nursing note and vitals reviewed.   BP 118/74 mmHg  Pulse 68  Temp(Src) 98.6 F (37 C) (Oral)  Ht  (1.854 m)  Wt 319 lb 2 oz (144.754 kg)  BMI 42.11 kg/m2  SpO2 97% Wt Readings from Last 3 Encounters:  02/19/15 319 lb 2 oz (144.754 kg)  12/30/14 332 lb 4 oz (150.708 kg)  12/30/14 333 lb (151.048 kg)     Lab Results  Component Value Date   WBC 7.2 12/30/2014   HGB 12.8* 12/30/2014   HCT 39.1 12/30/2014   PLT 324.0 12/30/2014   GLUCOSE 110* 12/12/2014   CHOL 172 07/07/2014   TRIG 177.0* 07/07/2014   HDL 29.70* 07/07/2014   LDLCALC 107* 07/07/2014   ALT 14 12/12/2014   AST 15 12/12/2014   NA 138 12/12/2014   K 3.4* 12/12/2014   CL 99 12/12/2014   CREATININE 1.02 12/12/2014   BUN 11 12/12/2014   CO2 31 12/12/2014   TSH 5.05* 07/07/2014   HGBA1C 6.3 07/07/2014   MICROALBUR 1.2 11/07/2014    Lab Results  Component Value Date   TSH 5.05* 07/07/2014   Lab Results  Component Value Date   WBC 7.2 12/30/2014   HGB 12.8* 12/30/2014   HCT 39.1 12/30/2014   MCV 90.4 12/30/2014   PLT 324.0 12/30/2014   Lab Results  Component Value Date   NA 138 12/12/2014   K 3.4* 12/12/2014   CO2 31 12/12/2014   GLUCOSE 110* 12/12/2014   BUN 11 12/12/2014   CREATININE 1.02 12/12/2014   BILITOT 0.5 12/12/2014   ALKPHOS 46 12/12/2014   AST 15 12/12/2014   ALT 14  12/12/2014   PROT 7.2 12/12/2014   ALBUMIN 3.6 12/12/2014   CALCIUM 9.1 12/12/2014   ANIONGAP 10 09/04/2014   GFR 81.33 12/12/2014   Lab Results  Component Value Date   CHOL 172 07/07/2014   Lab Results  Component Value Date   HDL 29.70* 07/07/2014   Lab Results  Component Value Date   LDLCALC 107* 07/07/2014   Lab Results  Component  Value Date   TRIG 177.0* 07/07/2014   Lab Results  Component Value Date   CHOLHDL 6 07/07/2014   Lab Results  Component Value Date   HGBA1C 6.3 07/07/2014       Assessment & Plan:   Problem List Items Addressed This Visit    HTN (hypertension)    Well controlled, no changes to meds. Encouraged heart healthy diet such as the DASH diet and exercise as tolerated.       Mass of sternum - Primary    At base of sternum, likely a bony prominence of no significance brought to light by rapid weight loss. 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 No further work up unless changes occur      Relevant Orders   DG Chest 2 View (Completed)   Morbid obesity (HCC) (Chronic)    Great weight loss since his surgery, feeling well and eating better      Tachycardia    RRR today       Other Visit Diagnoses    Encounter for sterilization        Relevant Orders    Ambulatory referral to Urology       I am having Mr. Quinter maintain his B-12, Calcium Carb-Cholecalciferol (CALCIUM 600 + D PO), multivitamin with minerals, Probiotic Product (PROBIOTIC & ACIDOPHILUS EX ST PO), traMADol, methylPREDNISolone, and metoprolol tartrate.  No orders of the defined types were placed in this encounter.     Danise EdgeBLYTH, STACEY, MD

## 2015-02-22 NOTE — Assessment & Plan Note (Signed)
RRR today 

## 2015-02-22 NOTE — Assessment & Plan Note (Signed)
At base of sternum, likely a bony prominence of no significance brought to light by rapid weight loss. 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 No further work up unless changes occur

## 2015-02-22 NOTE — Assessment & Plan Note (Signed)
Great weight loss since his surgery, feeling well and eating better

## 2015-02-22 NOTE — Assessment & Plan Note (Signed)
Well controlled, no changes to meds. Encouraged heart healthy diet such as the DASH diet and exercise as tolerated.  °

## 2015-03-06 ENCOUNTER — Ambulatory Visit: Payer: BLUE CROSS/BLUE SHIELD | Admitting: Family Medicine

## 2015-03-12 ENCOUNTER — Encounter: Payer: Self-pay | Admitting: Family Medicine

## 2015-04-14 ENCOUNTER — Encounter: Payer: Self-pay | Admitting: Family Medicine

## 2015-04-15 ENCOUNTER — Telehealth: Payer: Self-pay | Admitting: Family Medicine

## 2015-04-15 NOTE — Telephone Encounter (Signed)
e

## 2015-04-16 ENCOUNTER — Other Ambulatory Visit: Payer: Self-pay

## 2015-04-16 DIAGNOSIS — G4733 Obstructive sleep apnea (adult) (pediatric): Secondary | ICD-10-CM

## 2015-04-22 NOTE — Telephone Encounter (Signed)
Recd flu shot Sept/Oct 2016 at work

## 2015-04-24 ENCOUNTER — Encounter: Payer: Self-pay | Admitting: Family Medicine

## 2015-04-27 ENCOUNTER — Telehealth: Payer: Self-pay

## 2015-04-27 ENCOUNTER — Other Ambulatory Visit: Payer: Self-pay

## 2015-04-27 DIAGNOSIS — G4733 Obstructive sleep apnea (adult) (pediatric): Secondary | ICD-10-CM

## 2015-04-27 DIAGNOSIS — Z9989 Dependence on other enabling machines and devices: Principal | ICD-10-CM

## 2015-04-27 NOTE — Telephone Encounter (Signed)
Call pt on cell phone # [phone]

## 2015-04-27 NOTE — Telephone Encounter (Signed)
Spoke with patient and he has not a recent sleep study since 2006, referred him to pulmonology due to recent changes with weight loss and need to switch to new home care agency for CPAP supplies, per agency requesting a sleep study. Please advise?

## 2015-04-27 NOTE — Telephone Encounter (Signed)
So I have pended the order for the CPAP it states he was previously on 3 liters so i attached a note in the order for the amount of pressure. I spoke with [facility] other than the pressure they also need a copy of the sleep study faxed over.  I do not see a recent sleep study awaiting patient to return my call to verify a recent sleep study.

## 2015-04-27 NOTE — Telephone Encounter (Signed)
So since his sleep study is so old we will need to get a new study to order more equipment. If appt for sleep study is too far away may need to move sleep study referral over to another division.

## 2015-04-28 NOTE — Telephone Encounter (Signed)
Check with Marj she knows how to check on this

## 2015-04-28 NOTE — Telephone Encounter (Signed)
Has appt with pulmonology 05/11/2015

## 2015-04-28 NOTE — Telephone Encounter (Signed)
Dr. Abner Greenspan I spoke to [facility] , she went ahead and scheduled this patient an appointment with Dr. Sherene Sires on 05/11/2015 at 9:15AM to address this request..  The patient is aware of appointment and ok with this.

## 2015-04-28 NOTE — Telephone Encounter (Signed)
Ok so I had already put a order in for a refferal to pulmonology, is this were they do the sleep studys and if so should I call and let them know he needs a apt asap or what would be the other division idea.

## 2015-05-04 ENCOUNTER — Other Ambulatory Visit: Payer: Self-pay | Admitting: Urology

## 2015-05-11 ENCOUNTER — Institutional Professional Consult (permissible substitution): Payer: BLUE CROSS/BLUE SHIELD | Admitting: Internal Medicine

## 2015-05-21 ENCOUNTER — Ambulatory Visit (INDEPENDENT_AMBULATORY_CARE_PROVIDER_SITE_OTHER): Payer: BLUE CROSS/BLUE SHIELD | Admitting: Pulmonary Disease

## 2015-05-21 ENCOUNTER — Encounter: Payer: Self-pay | Admitting: Pulmonary Disease

## 2015-05-21 ENCOUNTER — Ambulatory Visit: Payer: BLUE CROSS/BLUE SHIELD | Admitting: Family Medicine

## 2015-05-21 VITALS — BP 118/78 | HR 63 | Ht 73.0 in | Wt 310.0 lb

## 2015-05-21 DIAGNOSIS — G4733 Obstructive sleep apnea (adult) (pediatric): Secondary | ICD-10-CM | POA: Diagnosis not present

## 2015-05-21 DIAGNOSIS — R0609 Other forms of dyspnea: Secondary | ICD-10-CM | POA: Diagnosis not present

## 2015-05-21 DIAGNOSIS — Z9989 Dependence on other enabling machines and devices: Principal | ICD-10-CM

## 2015-05-21 NOTE — Assessment & Plan Note (Signed)
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 He has lost 120 pounds. Weight reduction

## 2015-05-21 NOTE — Patient Instructions (Signed)
1. We will facilitate switching your DME from [facility] to [facility]. If you do not hear anything from os in the next 2 weeks, he's given [place] a call. 2. Continue CPAP use as discussed.   Return to clinic in 1 yr  J. Alexis Frock, MD [facility] Pager [phone] After 3pm or if no response, call [phone] Office: [phone], Fax: [phone]

## 2015-05-21 NOTE — Progress Notes (Signed)
Subjective:    Patient ID: Jeffrey Molina, male    DOB: 07/14/1960, 55 y.o.   MRN: [id number]  HPI    This is the case of Jeffrey Molina, 55 y.o. Male, who was referred by Dr. Reuel Derby in consultation regarding OSA. .   As you very well know, patient was diagnosed with sleep apnea 12 years ago. He had one study overnight at [facility]. Severe sleep apnea by his recollection. He is currently on a machine which is around 5-15 years old. He has been using CPAP machine, feels better using it. In June of last year, he had gastric bypass. He has lost 120 pounds. He has been using [facility] but he is not happy with their service. He wants to switch DME companies. There might be some mask issues. She wants to be refitted.       Review of Systems  Constitutional: Negative.  Negative for fever and unexpected weight change.       Lost 120 lbs since 08/2014.   HENT: Negative.  Negative for congestion, dental problem, ear pain, nosebleeds, postnasal drip, rhinorrhea, sinus pressure, sneezing, sore throat and trouble swallowing.   Eyes: Negative.  Negative for redness and itching.  Respiratory: Negative.  Negative for cough, chest tightness, shortness of breath and wheezing.   Cardiovascular: Positive for leg swelling. Negative for palpitations.  Gastrointestinal: Negative.  Negative for nausea and vomiting.  Endocrine: Negative.   Genitourinary: Negative.  Negative for dysuria.  Musculoskeletal: Negative.  Negative for joint swelling.  Skin: Negative.  Negative for rash.  Allergic/Immunologic: Negative.   Neurological: Negative.  Negative for headaches.  Hematological: Negative.  Does not bruise/bleed easily.  Psychiatric/Behavioral: Negative.  Negative for dysphoric mood. The patient is not nervous/anxious.   All other systems reviewed and are negative.  Past Medical History  Diagnosis Date  . Hypertension   . Chicken pox as a child  . Low testosterone 10/28/2013  . Cutaneous skin tags  10/28/2013  . Peripheral artery disease (HCC) 11/03/2013  . Diabetes mellitus type 2 in obese (HCC) 01/27/2014  . Tachycardia 05/15/2014  . Sleep apnea     has c-pap machine  . GERD (gastroesophageal reflux disease)   . Arthritis     both knees  . Anemia 10/28/2013    pt. stated "no" at preop appt. 08-29-14   (-) asthma/coipd/CA/DVT  Family History  Problem Relation Age of Onset  . Fibromyalgia Mother   . Cancer Father     skin cancer  . Cataracts Father   . Arthritis Father   . Diabetes Maternal Grandfather      Past Surgical History  Procedure Laterality Date  . Colonscopy      x 2  . 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 Marital Status: Married    Spouse Name: N/A  . Number of Children: N/A  . Years of Education: N/A   Occupational History  . Manager [employer]   Social History Main Topics  . Smoking status: Never Smoker   . Smokeless tobacco: Never Used  . Alcohol Use: No     Comment: very rare. special occasions  . Drug Use: No  . Sexual Activity: Yes     Comment: lives with wife and children, works for [employer]  Other Topics Concern  . Not on file   Social History Narrative   Married with 2 children. Non smoker. Occasional ETOH.   Allergies  Allergen Reactions  . Lisinopril-Hydrochlorothiazide Swelling    Lip swollen     Outpatient Prescriptions Prior to Visit  Medication Sig Dispense Refill  . Calcium Carb-Cholecalciferol (CALCIUM 600 + D PO) Take 1 tablet by mouth 3 (three) times daily.    . Methylcobalamin (B-12) 5000 MCG TBDP Take 1 tablet by mouth daily.    . metoprolol tartrate (LOPRESSOR) 25 MG tablet Take 0.5 tablets (12.5 mg total) by mouth 2 (two) times daily. 60 tablet 8  . Multiple Vitamin (MULTIVITAMIN WITH MINERALS) TABS  tablet Take 1 tablet by mouth 2 (two) times daily.    . Probiotic Product (PROBIOTIC & ACIDOPHILUS EX ST PO) Take by mouth.    . traMADol (ULTRAM) 50 MG tablet Take 1 tablet (50 mg total) by mouth 3 (three) times daily as needed for moderate pain or severe pain. 40 tablet 0  . methylPREDNISolone (MEDROL) 4 MG tablet 6 tabs po x 1 d then 5 tabs po qd x 1 d then 4 tabs po x 1 d then 3 tabs po x 1d then 2 tabs po x 1 d then 1 tab po x 1 d then stop 21 tablet 1   No facility-administered medications prior to visit.   No orders of the defined types were placed in this encounter.          Objective:   Physical Exam Vitals:  Filed Vitals:   05/21/15 0905  BP: 118/78  Pulse: 63  Height: 6\' 1"  (1.854 m)  Weight: 310 lb (140.615 kg)  SpO2: 98%    Constitutional/General:  Pleasant, well-nourished, well-developed, not in any distress,  Comfortably seating.  Well kempt  Body mass index is 40.91 kg/(m^2). Wt Readings from Last 3 Encounters:  05/21/15 310 lb (140.615 kg)  02/19/15 319 lb 2 oz (144.754 kg)  12/30/14 332 lb 4 oz (150.708 kg)    Neck circumference:  18 inches  HEENT: Pupils equal and reactive to light and accommodation. Anicteric sclerae. Normal nasal mucosa.   No oral  lesions,  mouth clear,  oropharynx clear, no postnasal drip. (-) Oral thrush. No dental caries.  Airway - Mallampati class III  Neck: No masses. Midline trachea. No JVD, (-) LAD. (-) bruits appreciated.  Respiratory/Chest: Grossly normal chest. (-) deformity. (-) Accessory muscle use.  Symmetric expansion. (-) Tenderness on palpation.  Resonant on percussion.  Diminished BS on both lower lung zones. (-) wheezing, crackles, rhonchi (-) egophony  Cardiovascular: Regular rate and  rhythm, heart sounds normal, no murmur or gallops, (+) peripheral edema Gr 3  Gastrointestinal:  Normal bowel sounds. Soft, non-tender. No hepatosplenomegaly.  (-) masses.   Musculoskeletal:  Normal muscle tone. Normal  gait.   Extremities: Grossly normal. (-) clubbing, cyanosis.  (+) dddddddddddssssssss2 edema  Skin: (-) rash,lesions seen.   Neurological/Psychiatric : alert, oriented to time, place, person. Normal mood and affect             Assessment & Plan:  OSA on CPAP Patient is here to establish care regarding his CPAP machine and sleep apnea. He was diagnosed with sleep apnea 12 years ago. Likely moderate to severe. It seems he is on auto CPAP. His current machine is around 51-37 years old. He feels better using the CPAP machine. More energy, less sleepiness. Significantly improved since using CPAP. Patient had gastric bypass in June  2016. He has lost 120 pounds since that time. He feels current CPAP settings are still comfortable. He does not feel it's too much pressure. He continues to feel benefit of CPAP. Plan: 1. He wants to switch DME's [facility] to [facility]. He is not happy with [facility] service. We will facilitate transferring DME's. We need to call [facility] to let them send patient's original sleep study done 12 years ago to [facility]. 2. Patient will need a mask fitting session. He will need mass, tubings, filter. 3. We will get a 3 month download. Most likely he is on auto CPAP. 4. Current machine is working fine. Told him to give [place] a call if it's starting to malfunction. We will try to get a new one during that time. 5. Discussed with him sleep hygiene.  Morbid obesity Status post gastric bypass June 2016. He has lost 120 pounds. Weight reduction  Dyspnea on exertion Wit exertional dyspnea. Significantly better since the bypass. Likely related to weight. Will observe for now. Patient with chronic lymphedema. Cardiac workup was negative per patient.     Thank you very much for letting me participate in this patient's care. Please do not hesitate to give me a call if you have any questions or concerns regarding the treatment plan.   Patient will follow up  with me in 1 yr. Sooner if with issues.     Pollie Meyer, MD Pulmonary and Critical Care Medicine [facility] Pager: [phone] Office: [phone], Fax: [phone]

## 2015-05-21 NOTE — Assessment & Plan Note (Signed)
Patient is here to establish care regarding his CPAP machine and sleep apnea. He was diagnosed with sleep apnea 12 years ago. Likely moderate to severe. It seems he is on auto CPAP. His current machine is around 42-53 years old. He feels better using the CPAP machine. More energy, less sleepiness. Significantly improved since using CPAP. Patient had gastric bypass in June 2016. He has lost 120 pounds since that time. He feels current CPAP settings are still comfortable. He does not feel it's too much pressure. He continues to feel benefit of CPAP. Plan: 1. He wants to switch D[facility]'s [facility]. He is not happy with [facility] service. We will facilitate transferring DME's. We need to call [facility] to let them send patient's original sleep study done 12 years ago to [facility]. 2. Patient will need a mask fitting session. He will need mass, tubings, filter. 3. We will get a 3 month download. Most likely he is on auto CPAP. 4. Current machine is working fine. Told him to give [place] a call if it's starting to malfunction. We will try to get a new one during that time. 5. Discussed with him sleep hygiene.

## 2015-05-21 NOTE — Assessment & Plan Note (Signed)
Wit exertional dyspnea. Significantly better since the bypass. Likely related to weight. Will observe for now. Patient with chronic lymphedema. Cardiac workup was negative per patient.

## 2015-06-05 ENCOUNTER — Encounter (HOSPITAL_BASED_OUTPATIENT_CLINIC_OR_DEPARTMENT_OTHER): Payer: Self-pay

## 2015-06-05 ENCOUNTER — Ambulatory Visit (HOSPITAL_BASED_OUTPATIENT_CLINIC_OR_DEPARTMENT_OTHER): Admit: 2015-06-05 | Payer: BLUE CROSS/BLUE SHIELD | Admitting: Urology

## 2015-06-05 SURGERY — VASECTOMY
Anesthesia: Choice | Laterality: Bilateral

## 2015-06-24 ENCOUNTER — Other Ambulatory Visit (HOSPITAL_BASED_OUTPATIENT_CLINIC_OR_DEPARTMENT_OTHER): Payer: BLUE CROSS/BLUE SHIELD

## 2015-06-29 ENCOUNTER — Encounter: Payer: BLUE CROSS/BLUE SHIELD | Attending: General Surgery | Admitting: Dietician

## 2015-06-29 ENCOUNTER — Encounter: Payer: Self-pay | Admitting: Dietician

## 2015-06-29 DIAGNOSIS — Z029 Encounter for administrative examinations, unspecified: Secondary | ICD-10-CM | POA: Insufficient documentation

## 2015-06-29 NOTE — Patient Instructions (Addendum)
Goals:  Follow Phase 3B: High Protein + Non-Starchy Vegetables  Eat 3-6 small meals/snacks, every 3-5 hrs  Increase lean protein foods to meet 80g goal  Increase fluid intake to 64oz +  Avoid drinking 15 minutes before, during and 30 minutes after eating  Work on being consistent with physical activity  Work on cutting down on fried foods  Surgery date: 09/02/14 Surgery type[facility]GB [facility]5 lbs on 05/24/14 (highest weight 436 lbs per patient) Weight today: 312.5 lbs Weight change: 21 lbs Total weight lost: 123.5 lbs

## 2015-06-29 NOTE — Progress Notes (Signed)
  Follow-up visit:  10 months Post-Operative RYGB Surgery  Medical Nutrition Therapy:  Appt start time: 415 end time: 430  Primary concerns today: Post-operative Bariatric Surgery Nutrition Management. Jeffrey Molina returns having lost another 21 pounds since last visit. Patient reports that he has noticed that his weight has stalled in the past few months. His lowest weight at home has been 306 lbs. Continues to avoid [facility]. Has been having some starches and fried foods. Does not have any cartilage in either knee and is not able to be as active as he would like. Notices more energy and smaller pant sizes. Would like to lose another 20-25 lbs.   Surgery date: 09/02/14 Surgery type: RYGB [facility] 433.5 lbs on 05/24/14 (highest weight 436 lbs per patient) Weight today: 312.5 lbs Weight change: 21 lbs Total weight lost: 123.5 lbs  TANITA  BODY COMP RESULTS  07/14/14 09/16/14 10/28/14 12/29/14 06/29/15   BMI (kg/m^2) 57.2 51.8 48.3 43.9 43.6   Fat Mass (lbs) 125.5 113.0 84.5 67 69.5   Fat Free Mass (lbs) 308 280.0 281.5 266 243   Total Body Water (lbs) 225.5 205.0 206 194.5 178    Preferred Learning Style:  No preference indicated   Learning Readiness:   Ready  24-hr recall: B (AM): sausage patty and hashbrown from [employer]  Snk (AM):  L (PM): fried Chickfila sandwich Snk (PM): pack of peanuts D (PM): fried shrimp Snk (PM): creamsicle  Fluid intake: water, water with Crystal Light, G2 (at least 64 oz per patient) Estimated total protein intake: 80+ grams per day  Medications: see list Supplementation: taking  Using straws: no Drinking while eating: sometimes sips during a meal and drinks within 30 minutes after Hair loss: none Carbonated beverages: none N/V/D/C: none Dumping syndrome: none  Recent physical activity:  Walking, inconsistent  Progress Towards Goal(s):  In progress.   Nutritional Diagnosis:  [place]-3.3 Overweight/obesity related to past poor dietary  habits and physical inactivity as evidenced by patient w/ recent RYGB surgery following dietary guidelines for continued weight loss.     Intervention:  Nutrition counseling provided. Goals:  Follow Phase 3B: High Protein + Non-Starchy Vegetables  Eat 3-6 small meals/snacks, every 3-5 hrs  Increase lean protein foods to meet 80g goal  Increase fluid intake to 64oz +  Avoid drinking 15 minutes before, during and 30 minutes after eating  Work on being consistent with physical activity  Work on cutting down on fried foods  Teaching Method Utilized:  Scientific laboratory technicianVisual Auditory Hands on  Barriers to learning/adherence to lifestyle change: knee pain  Demonstrated degree of understanding via:  Teach Back   Monitoring/Evaluation:  Dietary intake, exercise, and body weight. Follow up in 6 months for 16 month post-op visit.

## 2015-07-16 ENCOUNTER — Other Ambulatory Visit (INDEPENDENT_AMBULATORY_CARE_PROVIDER_SITE_OTHER): Payer: BLUE CROSS/BLUE SHIELD

## 2015-07-16 ENCOUNTER — Telehealth: Payer: Self-pay | Admitting: Family Medicine

## 2015-07-16 ENCOUNTER — Other Ambulatory Visit: Payer: Self-pay

## 2015-07-16 DIAGNOSIS — E669 Obesity, unspecified: Secondary | ICD-10-CM

## 2015-07-16 DIAGNOSIS — I10 Essential (primary) hypertension: Secondary | ICD-10-CM | POA: Diagnosis not present

## 2015-07-16 DIAGNOSIS — E1169 Type 2 diabetes mellitus with other specified complication: Secondary | ICD-10-CM

## 2015-07-16 DIAGNOSIS — D649 Anemia, unspecified: Secondary | ICD-10-CM | POA: Diagnosis not present

## 2015-07-16 DIAGNOSIS — E785 Hyperlipidemia, unspecified: Secondary | ICD-10-CM

## 2015-07-16 DIAGNOSIS — E119 Type 2 diabetes mellitus without complications: Secondary | ICD-10-CM

## 2015-07-16 LAB — COMPREHENSIVE METABOLIC PANEL
ALBUMIN: 3.8 g/dL (ref 3.5–5.2)
ALK PHOS: 59 U/L (ref 39–117)
ALT: 14 U/L (ref 0–53)
AST: 11 U/L (ref 0–37)
BUN: 14 mg/dL (ref 6–23)
CHLORIDE: 103 meq/L (ref 96–112)
CO2: 32 mEq/L (ref 19–32)
Calcium: 9.2 mg/dL (ref 8.4–10.5)
Creatinine, Ser: 0.77 mg/dL (ref 0.40–1.50)
GFR: 112.25 mL/min (ref >60.00)
GLUCOSE: 108 mg/dL — AB (ref 70–99)
POTASSIUM: 3.7 meq/L (ref 3.5–5.1)
SODIUM: 140 meq/L (ref 135–145)
TOTAL PROTEIN: 6.9 g/dL (ref 6.0–8.3)
Total Bilirubin: 0.5 mg/dL (ref 0.2–1.2)

## 2015-07-16 LAB — CBC WITH DIFFERENTIAL/PLATELET
BASOS ABS: 0.1 10*3/uL (ref 0.0–0.1)
Basophils Relative: 0.9 % (ref 0.0–3.0)
EOS ABS: 0.2 10*3/uL (ref 0.0–0.7)
Eosinophils Relative: 3.2 % (ref 0.0–5.0)
HEMATOCRIT: 42.2 % (ref 39.0–52.0)
HEMOGLOBIN: 14.1 g/dL (ref 13.0–17.0)
LYMPHS PCT: 27.1 % (ref 12.0–46.0)
Lymphs Abs: 1.6 10*3/uL (ref 0.7–4.0)
MCHC: 33.3 g/dL (ref 30.0–36.0)
MCV: 90.7 fl (ref 78.0–100.0)
Monocytes Absolute: 0.5 10*3/uL (ref 0.1–1.0)
Monocytes Relative: 9.2 % (ref 3.0–12.0)
NEUTROS ABS: 3.5 10*3/uL (ref 1.4–7.7)
Neutrophils Relative %: 59.6 % (ref 43.0–77.0)
PLATELETS: 290 10*3/uL (ref 150.0–400.0)
RBC: 4.66 Mil/uL (ref 4.22–5.81)
RDW: 13.6 % (ref 11.5–15.5)
WBC: 5.9 10*3/uL (ref 4.0–10.5)

## 2015-07-16 LAB — LIPID PANEL
Cholesterol: 138 mg/dL (ref 0–200)
HDL: 34 mg/dL — ABNORMAL LOW
LDL Cholesterol: 78 mg/dL (ref 0–99)
NonHDL: 104.43
Total CHOL/HDL Ratio: 4
Triglycerides: 133 mg/dL (ref 0.0–149.0)
VLDL: 26.6 mg/dL (ref 0.0–40.0)

## 2015-07-16 LAB — HEMOGLOBIN A1C: HEMOGLOBIN A1C: 5.6 % (ref 4.6–6.5)

## 2015-07-16 LAB — VITAMIN B12: Vitamin B-12: >1500 pg/mL — ABNORMAL HIGH (ref 211–911)

## 2015-07-16 NOTE — Telephone Encounter (Signed)
Labs ordered in Orders Only encounter.

## 2015-07-16 NOTE — Telephone Encounter (Signed)
Patient called to schedule lab appointment for labs requested by Dr. Andrey CampanileWilson (surgeon with [facility]). He needs a CMP, Lipid Panel, Vit B12, HbA1c, CBC w/diff. Dr. Abner GreenspanBlyth are you willing to order these labs for patient to have drawn at our location?  Patient placed on Lab schedule for 2:45, told him we would call if there was an issue

## 2015-07-16 NOTE — Telephone Encounter (Signed)
Ok to order requested labs for hyperlipidemia, diabetes 2, htn, anemia

## 2015-07-17 ENCOUNTER — Telehealth: Payer: Self-pay | Admitting: Family Medicine

## 2015-07-17 NOTE — Telephone Encounter (Signed)
Pt is returning CMA's call    CB:  [phone]

## 2015-07-17 NOTE — Telephone Encounter (Signed)
Patient informed of lab results dated 07/16/15.

## 2015-12-29 ENCOUNTER — Ambulatory Visit: Payer: BLUE CROSS/BLUE SHIELD | Admitting: Dietician

## 2016-01-22 DIAGNOSIS — G4733 Obstructive sleep apnea (adult) (pediatric): Secondary | ICD-10-CM | POA: Diagnosis not present

## 2016-02-05 IMAGING — CR DG TIBIA/FIBULA 2V*R*
2 series · 2 of 2 positions shown · non-contrast
Comparison: None.

CLINICAL DATA: Right lower leg pain

EXAM:
RIGHT TIBIA AND FIBULA - 2 VIEW

[AP]
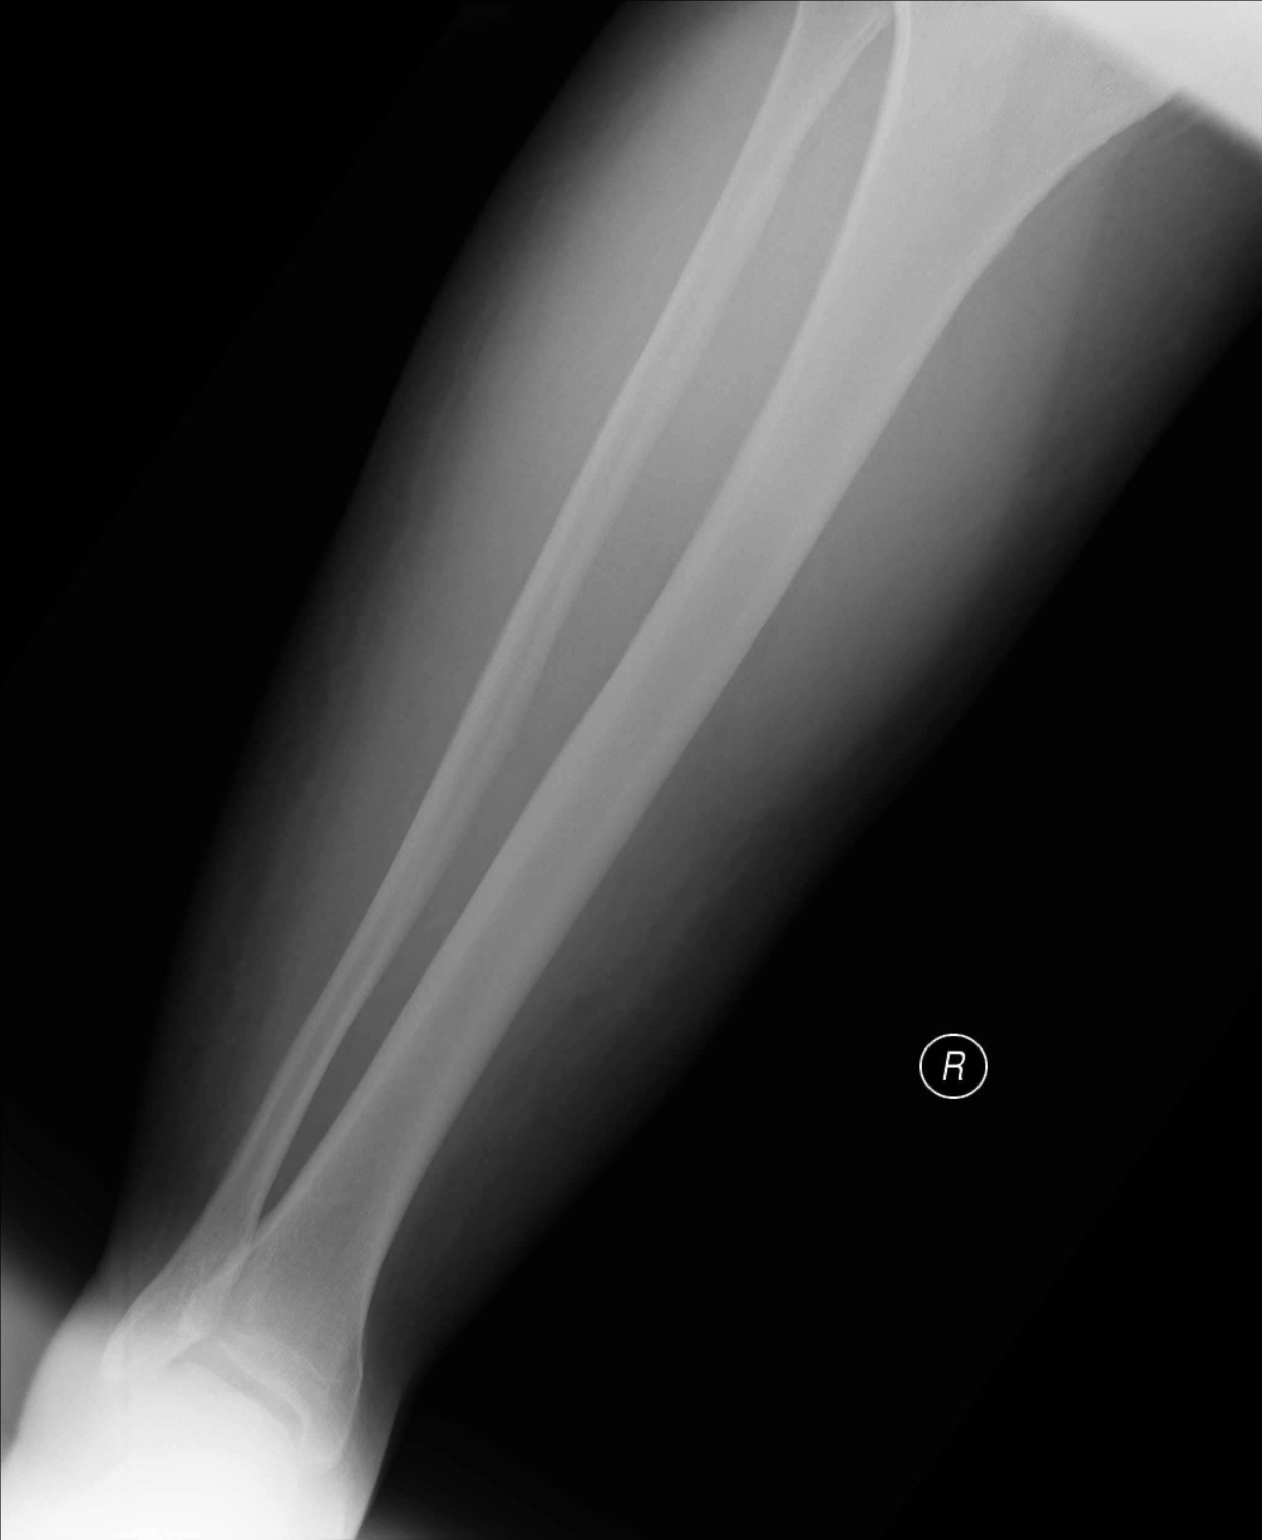

[lateral]
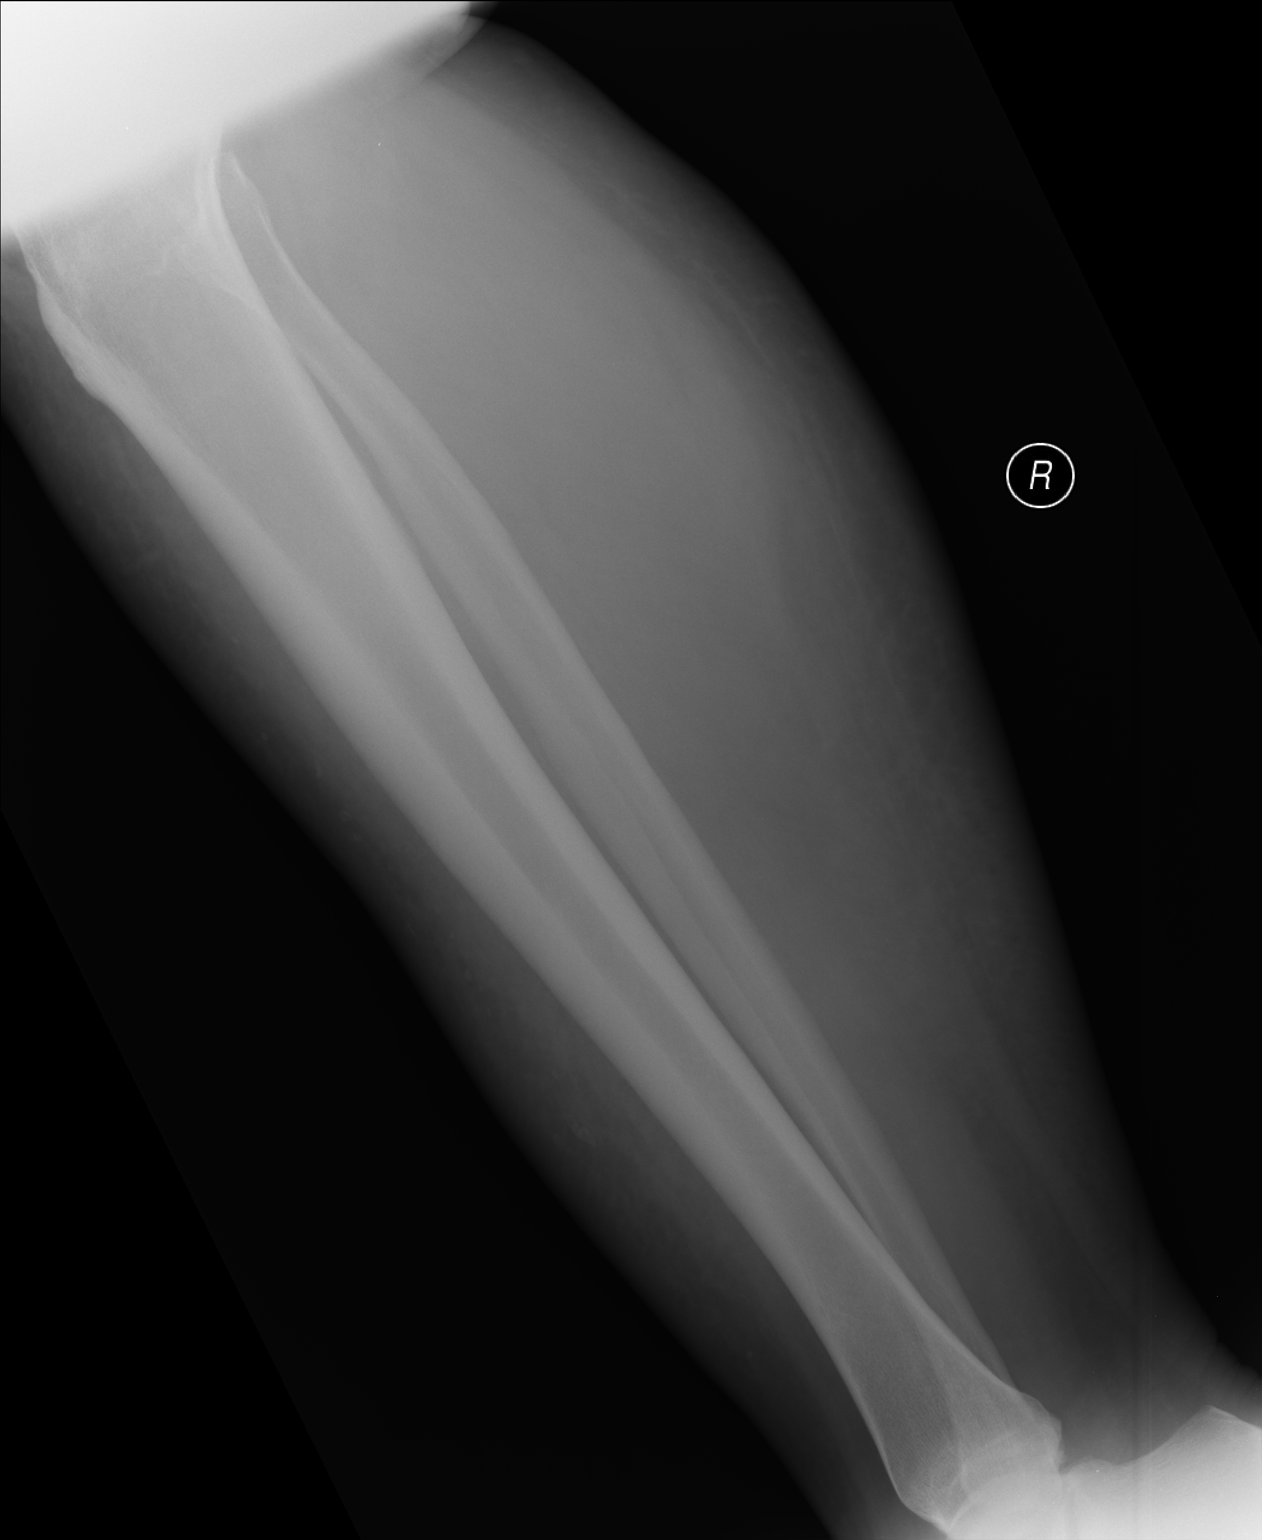

[2 of 2 positions shown; findings below may reference images not displayed]

FINDINGS: The proximal portions and distal portions of the right tibia and
fibula are omitted from the field of view. Allowing for this, no
displaced fracture is identified. No soft tissue abnormality or
radiopaque foreign body.
IMPRESSION: Allowing for partial omission of the proximal and distal tibia and
fibula, no fracture identified.

## 2016-02-24 ENCOUNTER — Encounter: Payer: Self-pay | Admitting: Family Medicine

## 2016-03-01 ENCOUNTER — Other Ambulatory Visit: Payer: Self-pay | Admitting: Family Medicine

## 2016-03-01 DIAGNOSIS — J0100 Acute maxillary sinusitis, unspecified: Secondary | ICD-10-CM | POA: Diagnosis not present

## 2016-03-28 ENCOUNTER — Other Ambulatory Visit: Payer: Self-pay

## 2016-03-28 MED ORDER — METOPROLOL TARTRATE 25 MG PO TABS: ORAL_TABLET | ORAL | 0 refills | Status: DC

## 2016-04-01 ENCOUNTER — Other Ambulatory Visit: Payer: Self-pay

## 2016-04-05 ENCOUNTER — Encounter (HOSPITAL_COMMUNITY): Payer: Self-pay

## 2016-06-16 ENCOUNTER — Encounter: Payer: Self-pay | Admitting: Medical

## 2016-06-16 ENCOUNTER — Ambulatory Visit (INDEPENDENT_AMBULATORY_CARE_PROVIDER_SITE_OTHER): Payer: BLUE CROSS/BLUE SHIELD | Admitting: Medical

## 2016-06-16 VITALS — BP 134/82 | HR 70 | Temp 98.1°F | Ht 71.0 in | Wt 327.6 lb

## 2016-06-16 DIAGNOSIS — I10 Essential (primary) hypertension: Secondary | ICD-10-CM

## 2016-06-16 DIAGNOSIS — Z9884 Bariatric surgery status: Secondary | ICD-10-CM

## 2016-06-16 DIAGNOSIS — Z9889 Other specified postprocedural states: Secondary | ICD-10-CM | POA: Diagnosis not present

## 2016-06-16 LAB — COMPREHENSIVE METABOLIC PANEL
ALBUMIN: 3.7 g/dL (ref 3.6–5.1)
ALK PHOS: 57 U/L (ref 40–115)
ALT: 12 U/L (ref 9–46)
AST: 10 U/L (ref 10–35)
BUN: 14 mg/dL (ref 7–25)
CALCIUM: 8.5 mg/dL — AB (ref 8.6–10.3)
CHLORIDE: 105 mmol/L (ref 98–110)
CO2: 24 mmol/L (ref 20–31)
Creat: 0.96 mg/dL (ref 0.70–1.33)
Glucose, Bld: 93 mg/dL (ref 65–99)
POTASSIUM: 3.8 mmol/L (ref 3.5–5.3)
Sodium: 141 mmol/L (ref 135–146)
TOTAL PROTEIN: 6.3 g/dL (ref 6.1–8.1)
Total Bilirubin: 0.5 mg/dL (ref 0.2–1.2)

## 2016-06-16 LAB — VITAMIN B12: VITAMIN B 12: 808 pg/mL (ref 200–1100)

## 2016-06-16 MED ORDER — METOPROLOL TARTRATE 25 MG PO TABS: ORAL_TABLET | ORAL | 3 refills | Status: DC

## 2016-06-16 NOTE — Progress Notes (Signed)
Pre visit review using our clinic tool,if applicable. No additional management support is needed unless otherwise documented below in the visit note.  

## 2016-06-16 NOTE — Progress Notes (Signed)
Subjective:    Patient ID: Jeffrey Molina, male    DOB: 1960/04/09, 56 y.o.   MRN: [id number]  HPI  Pt in for request to get mail order rx.  Pt states he wants med sent mail order for his bp med. Pt states his bp really tight controlled in the past. Today little high than usual but stressful situation right before he left work.  No cardiac or neurologic signs or symptoms.  Pt has been taking the metoprolol 25 mg 1 tab po q day. Although sig states 1/2 twice a day.  Pt used to be on b-12 post GI bypass surgery. He had high levels and then stopped. About 11 months ago except for rare tab the other day. He wants to check b12 level today.    Review of Systems  Constitutional: Negative for chills, fatigue and fever.  HENT: Negative for congestion, ear pain, mouth sores, postnasal drip, sinus pain, sinus pressure and sneezing.   Respiratory: Negative for cough, choking, shortness of breath and wheezing.   Cardiovascular: Negative for chest pain and palpitations.  Gastrointestinal: Negative for abdominal pain, blood in stool, constipation, diarrhea, nausea and vomiting.  Musculoskeletal: Negative for back pain and neck pain.  Neurological: Negative for dizziness, weakness, numbness and headaches.  Hematological: Negative for adenopathy. Does not bruise/bleed easily.  Psychiatric/Behavioral: Negative for behavioral problems, confusion and sleep disturbance. The patient is not nervous/anxious.    Past Medical History:  Diagnosis Date  . Anemia 10/28/2013   pt. stated "no" at preop appt. 08-29-14  . Arthritis    both knees  . Chicken pox as a child  . Cutaneous skin tags 10/28/2013  . Diabetes mellitus type 2 in obese (HCC) 01/27/2014  . GERD (gastroesophageal reflux disease)   . Hypertension   . Low testosterone 10/28/2013  . Peripheral artery disease (HCC) 11/03/2013  . Sleep apnea    has c-pap machine  . Tachycardia 05/15/2014     Social History   Social History  . Marital  status: Married    Spouse name: N/A  . Number of children: N/A  . Years of education: N/A   Occupational History  . Manager [employer]   Social History Main Topics  . Smoking status: Never Smoker  . Smokeless tobacco: Never Used  . Alcohol use No     Comment: very rare. special occasions  . Drug use: No  . Sexual activity: Yes     Comment: lives with wife and children, works for [employer]   Other Topics Concern  . Not on file   Social History Narrative  . No narrative on file    Past Surgical History:  Procedure Laterality Date  . colonscopy     x 2  . 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 Fibromyalgia Mother   . Cancer Father     skin cancer  . Cataracts Father   . Arthritis Father   . Diabetes Maternal Grandfather     Allergies  Allergen Reactions  . Lisinopril-Hydrochlorothiazide Swelling    Lip swollen    Current Outpatient Prescriptions on File Prior to Visit  Medication Sig Dispense Refill  . Calcium Carb-Cholecalciferol (CALCIUM 600 + D PO) Take 1 tablet by mouth 3 (three) times daily.    Marland Kitchen  Multiple Vitamin (MULTIVITAMIN WITH MINERALS) TABS tablet Take 1 tablet by mouth 2 (two) times daily.    . Probiotic Product (PROBIOTIC & ACIDOPHILUS EX ST PO) Take by mouth.    . Methylcobalamin (B-12) 5000 MCG TBDP Take 1 tablet by mouth daily.     No current facility-administered medications on file prior to visit.     BP 134/82   Pulse 70   Temp 98.1 F (36.7 C) (Oral)   Ht  (1.803 m)   Wt (!) 327 lb 9.6 oz (148.6 kg)   SpO2 97%   BMI 45.69 kg/m       Objective:   Physical Exam  General Mental Status- Alert. General Appearance- Not in acute distress.   Skin General: Color- Normal Color. Moisture- Normal  Moisture.  Neck Carotid Arteries- Normal color. Moisture- Normal Moisture. No carotid bruits. No JVD.  Chest and Lung Exam Auscultation: Breath Sounds:-Normal.  Cardiovascular Auscultation:Rythm- Regular. Murmurs & Other Heart Sounds:Auscultation of the heart reveals- No Murmurs.   Neurologic Cranial Nerve exam:- CN III-XII intact(No nystagmus), symmetric smile. .Finger to Nose:- Normal/Intact Strength:- 5/5 equal and symmetric strength both upper and lower extremities.     Assessment & Plan:  Your bp is well controlled today although little higher than usual. I [facility] your metoprolol [facility]ill [facility]. Number to  Pharmacy is [phone].(recommend call on Monday)  We will check cmp today to check kidney function and order b12 level since you have not been on b12 for months.  Follow up as regularly scheduled with pcp or as needed  Kamesha Herne, Ramon Dredge, PA-C

## 2016-06-16 NOTE — Patient Instructions (Signed)
Your bp is well controlled today although little higher than usual. I sent your [facility]rol[facility]. Number to  Pharmacy is [phone].(recommend call on Monday)  We will check cmp today to check kidney function and order b12 level since you have not been on b12 for months.  Follow up as regularly scheduled with pcp or as needed

## 2016-06-21 ENCOUNTER — Encounter: Payer: Self-pay | Admitting: *Deleted

## 2016-06-21 NOTE — Progress Notes (Signed)
Letter mailed/SLS 04/03

## 2016-06-29 ENCOUNTER — Encounter: Payer: Self-pay | Admitting: Family Medicine

## 2016-08-30 DIAGNOSIS — J0100 Acute maxillary sinusitis, unspecified: Secondary | ICD-10-CM | POA: Diagnosis not present

## 2016-08-31 ENCOUNTER — Encounter: Payer: Self-pay | Admitting: Family Medicine

## 2016-09-01 ENCOUNTER — Telehealth: Payer: Self-pay | Admitting: *Deleted

## 2016-09-01 NOTE — Telephone Encounter (Signed)
Received Physician Orders from [facility], forwarded to provider/SLS 06/14

## 2016-09-12 DIAGNOSIS — Z86010 Personal history of colonic polyps: Secondary | ICD-10-CM | POA: Diagnosis not present

## 2016-09-23 ENCOUNTER — Telehealth: Payer: Self-pay | Admitting: *Deleted

## 2016-09-23 NOTE — Telephone Encounter (Signed)
Received Physician Orders for Statement of Medical Necessity [facility], forwarded to provider/SLS 07/06

## 2016-09-26 DIAGNOSIS — G4733 Obstructive sleep apnea (adult) (pediatric): Secondary | ICD-10-CM | POA: Diagnosis not present

## 2016-10-04 ENCOUNTER — Encounter: Payer: BLUE CROSS/BLUE SHIELD | Admitting: Family Medicine

## 2016-10-18 DIAGNOSIS — K5140 Inflammatory polyps of colon without complications: Secondary | ICD-10-CM | POA: Diagnosis not present

## 2016-10-18 DIAGNOSIS — Z86010 Personal history of colonic polyps: Secondary | ICD-10-CM | POA: Diagnosis not present

## 2016-10-19 DIAGNOSIS — K5140 Inflammatory polyps of colon without complications: Secondary | ICD-10-CM | POA: Diagnosis not present

## 2016-10-25 ENCOUNTER — Encounter: Payer: Self-pay | Admitting: Family Medicine

## 2016-10-25 ENCOUNTER — Ambulatory Visit (INDEPENDENT_AMBULATORY_CARE_PROVIDER_SITE_OTHER): Payer: BLUE CROSS/BLUE SHIELD | Admitting: Family Medicine

## 2016-10-25 VITALS — BP 120/86 | HR 63 | Temp 98.1°F | Resp 18 | Ht 73.0 in | Wt 333.4 lb

## 2016-10-25 DIAGNOSIS — Z8619 Personal history of other infectious and parasitic diseases: Secondary | ICD-10-CM | POA: Diagnosis not present

## 2016-10-25 DIAGNOSIS — M25561 Pain in right knee: Secondary | ICD-10-CM | POA: Diagnosis not present

## 2016-10-25 DIAGNOSIS — E669 Obesity, unspecified: Secondary | ICD-10-CM | POA: Diagnosis not present

## 2016-10-25 DIAGNOSIS — Z9884 Bariatric surgery status: Secondary | ICD-10-CM

## 2016-10-25 DIAGNOSIS — R7989 Other specified abnormal findings of blood chemistry: Secondary | ICD-10-CM

## 2016-10-25 DIAGNOSIS — E1169 Type 2 diabetes mellitus with other specified complication: Secondary | ICD-10-CM | POA: Diagnosis not present

## 2016-10-25 DIAGNOSIS — I10 Essential (primary) hypertension: Secondary | ICD-10-CM | POA: Diagnosis not present

## 2016-10-25 DIAGNOSIS — Z0000 Encounter for general adult medical examination without abnormal findings: Secondary | ICD-10-CM

## 2016-10-25 DIAGNOSIS — M25562 Pain in left knee: Secondary | ICD-10-CM

## 2016-10-25 DIAGNOSIS — N529 Male erectile dysfunction, unspecified: Secondary | ICD-10-CM

## 2016-10-25 DIAGNOSIS — E782 Mixed hyperlipidemia: Secondary | ICD-10-CM | POA: Diagnosis not present

## 2016-10-25 NOTE — Patient Instructions (Signed)
Shingrix is the new shingles shot, 2 shots over six months to prevent shingles, call insurance and confirm they cover, if you get it at the phamacy have them forward [place] a copy Preventive Care 40-64 Years, Male Preventive care refers to lifestyle choices and visits with your health care provider that can promote health and wellness. What does preventive care include?  A yearly physical exam. This is also called an annual well check.  Dental exams once or twice a year.  Routine eye exams. Ask your health care provider how often you should have your eyes checked.  Personal lifestyle choices, including: ? Daily care of your teeth and gums. ? Regular physical activity. ? Eating a healthy diet. ? Avoiding tobacco and drug use. ? Limiting alcohol use. ? Practicing safe sex. ? Taking low-dose aspirin every day starting at age 46. What happens during an annual well check? The services and screenings done by your health care provider during your annual well check will depend on your age, overall health, lifestyle risk factors, and family history of disease. Counseling Your health care provider may ask you questions about your:  Alcohol use.  Tobacco use.  Drug use.  Emotional well-being.  Home and relationship well-being.  Sexual activity.  Eating habits.  Work and work Statistician.  Screening You may have the following tests or measurements:  Height, weight, and BMI.  Blood pressure.  Lipid and cholesterol levels. These may be checked every 5 years, or more frequently if you are over 45 years old.  Skin check.  Lung cancer screening. You may have this screening every year starting at age 11 if you have a 30-pack-year history of smoking and currently smoke or have quit within the past 15 years.  Fecal occult blood test (FOBT) of the stool. You may have this test every year starting at age 55.  Flexible sigmoidoscopy or colonoscopy. You may have a sigmoidoscopy every 5  years or a colonoscopy every 10 years starting at age 87.  Prostate cancer screening. Recommendations will vary depending on your family history and other risks.  Hepatitis C blood test.  Hepatitis B blood test.  Sexually transmitted disease (STD) testing.  Diabetes screening. This is done by checking your blood sugar (glucose) after you have not eaten for a while (fasting). You may have this done every 1-3 years.  Discuss your test results, treatment options, and if necessary, the need for more tests with your health care provider. Vaccines Your health care provider may recommend certain vaccines, such as:  Influenza vaccine. This is recommended every year.  Tetanus, diphtheria, and acellular pertussis (Tdap, Td) vaccine. You may need a Td booster every 10 years.  Varicella vaccine. You may need this if you have not been vaccinated.  Zoster vaccine. You may need this after age 80.  Measles, mumps, and rubella (MMR) vaccine. You may need at least one dose of MMR if you were born in 1957 or later. You may also need a second dose.  Pneumococcal 13-valent conjugate (PCV13) vaccine. You may need this if you have certain conditions and have not been vaccinated.  Pneumococcal polysaccharide (PPSV23) vaccine. You may need one or two doses if you smoke cigarettes or if you have certain conditions.  Meningococcal vaccine. You may need this if you have certain conditions.  Hepatitis A vaccine. You may need this if you have certain conditions or if you travel or work in places where you may be exposed to hepatitis A.  Hepatitis B  vaccine. You may need this if you have certain conditions or if you travel or work in places where you may be exposed to hepatitis B.  Haemophilus influenzae type b (Hib) vaccine. You may need this if you have certain risk factors.  Talk to your health care provider about which screenings and vaccines you need and how often you need them. This information is not  intended to replace advice given to you by your health care provider. Make sure you discuss any questions you have with your health care provider. Document Released: 04/03/2015 Document Revised: 11/25/2015 Document Reviewed: 01/06/2015 Elsevier Interactive Patient Education  2017 [employer].

## 2016-10-25 NOTE — Assessment & Plan Note (Signed)
Check PSA. ?

## 2016-10-25 NOTE — Assessment & Plan Note (Signed)
Well controlled, no changes to meds. Encouraged heart healthy diet such as the DASH diet and exercise as tolerated.  °

## 2016-10-25 NOTE — Progress Notes (Signed)
Subjective:  I acted as a Neurosurgeonscribe for Dr. Abner GreenspanBlyth. [place], [place]  Patient ID: Jeffrey Molina, male    DOB: October 30, 1960, 56 y.o.   MRN: [id number]  No chief complaint on file.   HPI  Patient is in today for an annual exam. Patient c/o both knees giving him problems. He states he does not have cartilage in either knees and possibly wants a knee replacement. No recent febrile illness or acute hospitalizations. Denies CP/palp/SOB/HA/congestion/fevers/GI or GU c/o. Taking meds as prescribed. He is frustrated with his recent weight gain after undergoing gastric bypass. He denies polyuria or polydipsia. He continues to work long hours. Is doing well with his ADLs.    Patient Care Team: Bradd CanaryBlyth, Teniqua Marron A, MD as PCP - General (Family Medicine)   Past Medical History:  Diagnosis Date  . Anemia 10/28/2013   pt. stated "no" at preop appt. 08-29-14  . Arthritis    both knees  . Chicken pox as a child  . Cutaneous skin tags 10/28/2013  . Diabetes mellitus type 2 in obese (HCC) 01/27/2014  . GERD (gastroesophageal reflux disease)   . History of chicken pox   . Hypertension   . Low testosterone 10/28/2013  . Peripheral artery disease (HCC) 11/03/2013  . Preventative health care 10/26/2016  . Sleep apnea    has c-pap machine  . Tachycardia 05/15/2014    Past Surgical History:  Procedure Laterality Date  . colonscopy     x 2  . GASTRIC ROUX-EN-Y N/A 09/02/2014   Procedure: LAPAROSCOPIC ROUX-EN-Y GASTRIC BYPASS WITH UPPER ENDOSCOPY;  Surgeon: Gaynelle AduEric Wilson, MD;  Location: [facility];  Service: General;  Laterality: N/A;    Family History  Problem Relation Age of Onset  . Fibromyalgia Mother   . Cancer Father        skin cancer  . Cataracts Father   . Arthritis Father   . Diabetes Maternal Grandfather     Social History   Social History  . Marital status: Married    Spouse name: N/A  . Number of children: N/A  . Years of education: N/A   Occupational History  . Manager [employer]    Social History Main Topics  . Smoking status: Never Smoker  . Smokeless tobacco: Never Used  . Alcohol use No  . Drug use: No  . Sexual activity: Yes     Comment: lives with wife and children, works for [employer]   Other Topics Concern  . Not on file   Social History Narrative  . No narrative on file    Outpatient Medications Prior to Visit  Medication Sig Dispense Refill  . Calcium Carb-Cholecalciferol (CALCIUM 600 + D PO) Take 1 tablet by mouth 3 (three) times daily.    . Methylcobalamin (B-12) 5000 MCG TBDP Take 1 tablet by mouth daily.    . metoprolol tartrate (LOPRESSOR) 25 MG tablet TAKE 1/2 TABLETS (12.5 MG TOTAL) BY MOUTH 2 (TWO) TIMES DAILY. 90 tablet 3  . Multiple Vitamin (MULTIVITAMIN WITH MINERALS) TABS tablet Take 1 tablet by mouth 2 (two) times daily.    . Probiotic Product (PROBIOTIC & ACIDOPHILUS EX ST PO) Take by mouth.     No facility-administered medications prior to visit.     Allergies  Allergen Reactions  . Lisinopril-Hydrochlorothiazide Swelling    Lip swollen    Review of Systems  Constitutional: Negative for fever and malaise/fatigue.  HENT: Negative for congestion.   Eyes: Negative for blurred vision.  Respiratory:  Negative for cough and shortness of breath.   Cardiovascular: Negative for chest pain, palpitations and leg swelling.  Gastrointestinal: Negative for vomiting.  Musculoskeletal: Negative for back pain.  Skin: Negative for rash.  Neurological: Negative for loss of consciousness and headaches.       Objective:    Physical Exam  Constitutional: He is oriented to person, place, and time. He appears well-developed and well-nourished. No distress.  HENT:  Head: Normocephalic and atraumatic.  Eyes: Conjunctivae are normal.  Neck: Normal range of motion. No thyromegaly present.  Cardiovascular: Normal rate and regular rhythm.   Pulmonary/Chest: Effort normal and breath sounds normal. He has no wheezes.  Abdominal:  Soft. Bowel sounds are normal. There is no tenderness.  Musculoskeletal: Normal range of motion. He exhibits no edema or deformity.  Neurological: He is alert and oriented to person, place, and time.  Skin: Skin is warm and dry. He is not diaphoretic.  Psychiatric: He has a normal mood and affect.    BP 120/86 (BP Location: Left Arm, Patient Position: Sitting, Cuff Size: Normal)   Pulse 63   Temp 98.1 F (36.7 C) (Oral)   Resp 18   Ht 6\' 1"  (1.854 m)   Wt (!) 333 lb 6.4 oz (151.2 kg)   SpO2 97%   BMI 43.99 kg/m  Wt Readings from Last 3 Encounters:  10/25/16 (!) 333 lb 6.4 oz (151.2 kg)  06/16/16 (!) 327 lb 9.6 oz (148.6 kg)  06/29/15 (!) 312 lb 8 oz (141.7 kg)   BP Readings from Last 3 Encounters:  10/25/16 120/86  06/16/16 134/82  05/21/15 118/78     Immunization History  Administered Date(s) Administered  . Influenza-Unspecified 01/20/2014, 11/21/2014    Health Maintenance  Topic Date Due  . Hepatitis C Screening  Oct 31, 1960  . PNEUMOCOCCAL POLYSACCHARIDE VACCINE (1) 05/14/1964  . FOOT EXAM  05/14/1972  . HIV Screening  05/14/1977  . TETANUS/TDAP  05/14/1981  . OPHTHALMOLOGY EXAM  09/19/2015  . URINE MICROALBUMIN  11/07/2015  . HEMOGLOBIN A1C  01/15/2016  . INFLUENZA VACCINE  10/19/2016  . COLONOSCOPY  05/29/2021    Lab Results  Component Value Date   WBC 5.3 10/25/2016   HGB 14.1 10/25/2016   HCT 42.8 10/25/2016   PLT 259.0 10/25/2016   GLUCOSE 94 10/25/2016   CHOL 145 10/25/2016   TRIG 102.0 10/25/2016   HDL 34.30 (L) 10/25/2016   LDLCALC 90 10/25/2016   ALT 13 10/25/2016   AST 13 10/25/2016   NA 138 10/25/2016   K 4.0 10/25/2016   CL 104 10/25/2016   CREATININE 0.84 10/25/2016   BUN 13 10/25/2016   CO2 33 (H) 10/25/2016   TSH 2.18 10/25/2016   HGBA1C 5.7 10/25/2016   MICROALBUR 1.2 11/07/2014    Lab Results  Component Value Date   TSH 2.18 10/25/2016   Lab Results  Component Value Date   WBC 5.3 10/25/2016   HGB 14.1 10/25/2016   HCT  42.8 10/25/2016   MCV 93.2 10/25/2016   PLT 259.0 10/25/2016   Lab Results  Component Value Date   NA 138 10/25/2016   K 4.0 10/25/2016   CO2 33 (H) 10/25/2016   GLUCOSE 94 10/25/2016   BUN 13 10/25/2016   CREATININE 0.84 10/25/2016   BILITOT 0.4 10/25/2016   ALKPHOS 60 10/25/2016   AST 13 10/25/2016   ALT 13 10/25/2016   PROT 6.8 10/25/2016   ALBUMIN 3.8 10/25/2016   CALCIUM 8.9 10/25/2016   ANIONGAP 10 09/04/2014  GFR 101.04 10/25/2016   Lab Results  Component Value Date   CHOL 145 10/25/2016   Lab Results  Component Value Date   HDL 34.30 (L) 10/25/2016   Lab Results  Component Value Date   LDLCALC 90 10/25/2016   Lab Results  Component Value Date   TRIG 102.0 10/25/2016   Lab Results  Component Value Date   CHOLHDL 4 10/25/2016   Lab Results  Component Value Date   HGBA1C 5.7 10/25/2016         Assessment & Plan:   Problem List Items Addressed This Visit    Morbid obesity (HCC) (Chronic)    Encouraged DASH diet, decrease po intake and increase exercise as tolerated. Needs 7-8 hours of sleep nightly. Avoid trans fats, eat small, frequent meals every 4-5 hours with lean proteins, complex carbs and healthy fats. Minimize simple carbs, referred to bariatric practice      HTN (hypertension)    Well controlled, no changes to meds. Encouraged heart healthy diet such as the DASH diet and exercise as tolerated.       Relevant Orders   CBC (Completed)   Comprehensive metabolic panel (Completed)   TSH (Completed)   History of chicken pox    shingrix encouraged       Hyperlipidemia   Relevant Orders   Lipid panel (Completed)   Erectile dysfunction    Check PSA      Low testosterone    Declines testing       Knee pain, bilateral    Has previously seen sports med and been told. Referred to [facility]      Diabetes mellitus type 2 in obese [facility])    hgba1c acceptable, minimize simple carbs. Increase exercise as tolerated. Continue current  meds      Relevant Orders   Hemoglobin A1c (Completed)   S/P gastric bypass - Primary    Check vitamin b studies, vitamin D, zinc and magnesium. Continue supplements      Relevant Orders   Vitamin B12 (Completed)   VITAMIN D 25 Hydroxy (Vit-D Deficiency, Fractures) (Completed)   Magnesium (Completed)   Zinc   Folate (Completed)   Vitamin B1   Preventative health care    Patient encouraged to maintain heart healthy diet, regular exercise, adequate sleep. Consider daily probiotics. Take medications as prescribed. Given and reviewed copy of ACP documents from [employer] and encouraged to complete and return.         I am having Mr. Helmers maintain his B-12, Calcium Carb-Cholecalciferol (CALCIUM 600 + D PO), multivitamin with minerals, Probiotic Product (PROBIOTIC & ACIDOPHILUS EX ST PO), and metoprolol tartrate.  No orders of the defined types were placed in this encounter.   CMA served as Neurosurgeon during this visit. History, Physical and Plan performed by medical provider. Documentation and orders reviewed and attested to.  Danise Edge, MD

## 2016-10-25 NOTE — Assessment & Plan Note (Signed)
hgba1c acceptable, minimize simple carbs. Increase exercise as tolerated. Continue current meds 

## 2016-10-25 NOTE — Assessment & Plan Note (Signed)
shingrix encouraged

## 2016-10-25 NOTE — Assessment & Plan Note (Signed)
Declines testing.

## 2016-10-25 NOTE — Assessment & Plan Note (Signed)
Encouraged DASH diet, decrease po intake and increase exercise as tolerated. Needs 7-8 hours of sleep nightly. Avoid trans fats, eat small, frequent meals every 4-5 hours with lean proteins, complex carbs and healthy fats. Minimize simple carbs, referred to bariatric practice

## 2016-10-25 NOTE — Assessment & Plan Note (Addendum)
Has previously seen sports med and been told. Referred to [facility]

## 2016-10-26 ENCOUNTER — Encounter: Payer: Self-pay | Admitting: Family Medicine

## 2016-10-26 DIAGNOSIS — Z0000 Encounter for general adult medical examination without abnormal findings: Secondary | ICD-10-CM

## 2016-10-26 HISTORY — DX: Encounter for general adult medical examination without abnormal findings: Z00.00

## 2016-10-26 LAB — COMPREHENSIVE METABOLIC PANEL
ALK PHOS: 60 U/L (ref 39–117)
ALT: 13 U/L (ref 0–53)
AST: 13 U/L (ref 0–37)
Albumin: 3.8 g/dL (ref 3.5–5.2)
BILIRUBIN TOTAL: 0.4 mg/dL (ref 0.2–1.2)
BUN: 13 mg/dL (ref 6–23)
CO2: 33 mEq/L — ABNORMAL HIGH (ref 19–32)
Calcium: 8.9 mg/dL (ref 8.4–10.5)
Chloride: 104 mEq/L (ref 96–112)
Creatinine, Ser: 0.84 mg/dL (ref 0.40–1.50)
GFR: 101.04 mL/min (ref >60.00)
GLUCOSE: 94 mg/dL (ref 70–99)
Potassium: 4 mEq/L (ref 3.5–5.1)
Sodium: 138 mEq/L (ref 135–145)
Total Protein: 6.8 g/dL (ref 6.0–8.3)

## 2016-10-26 LAB — TSH: TSH: 2.18 u[IU]/mL (ref 0.35–4.50)

## 2016-10-26 LAB — CBC
HCT: 42.8 % (ref 39.0–52.0)
Hemoglobin: 14.1 g/dL (ref 13.0–17.0)
MCHC: 33 g/dL (ref 30.0–36.0)
MCV: 93.2 fl (ref 78.0–100.0)
Platelets: 259 10*3/uL (ref 150.0–400.0)
RBC: 4.59 Mil/uL (ref 4.22–5.81)
RDW: 14.2 % (ref 11.5–15.5)
WBC: 5.3 10*3/uL (ref 4.0–10.5)

## 2016-10-26 LAB — VITAMIN D 25 HYDROXY (VIT D DEFICIENCY, FRACTURES): VITD: 25 ng/mL — AB (ref 30.00–100.00)

## 2016-10-26 LAB — LIPID PANEL
Cholesterol: 145 mg/dL (ref 0–200)
HDL: 34.3 mg/dL — AB (ref >39.00)
LDL Cholesterol: 90 mg/dL (ref 0–99)
NONHDL: 110.25
Total CHOL/HDL Ratio: 4
Triglycerides: 102 mg/dL (ref 0.0–149.0)
VLDL: 20.4 mg/dL (ref 0.0–40.0)

## 2016-10-26 LAB — MAGNESIUM: MAGNESIUM: 2.2 mg/dL (ref 1.5–2.5)

## 2016-10-26 LAB — FOLATE: Folate: 21.1 ng/mL (ref >5.9)

## 2016-10-26 LAB — HEMOGLOBIN A1C: HEMOGLOBIN A1C: 5.7 % (ref 4.6–6.5)

## 2016-10-26 LAB — VITAMIN B12: VITAMIN B 12: 397 pg/mL (ref 211–911)

## 2016-10-26 NOTE — Assessment & Plan Note (Signed)
Patient encouraged to maintain heart healthy diet, regular exercise, adequate sleep. Consider daily probiotics. Take medications as prescribed. Given and reviewed copy of ACP documents from [place] Secretary of State and encouraged to complete and return 

## 2016-10-26 NOTE — Assessment & Plan Note (Signed)
Check vitamin b studies, vitamin D, zinc and magnesium. Continue supplements

## 2016-10-27 ENCOUNTER — Other Ambulatory Visit: Payer: Self-pay | Admitting: Family Medicine

## 2016-10-27 LAB — ZINC: Zinc: 70 ug/dL (ref 60–130)

## 2016-10-27 MED ORDER — VITAMIN D (ERGOCALCIFEROL) 1.25 MG (50000 UNIT) PO CAPS: 50000.0000 [IU] | ORAL_CAPSULE | ORAL | 1 refills | Status: DC

## 2016-10-31 LAB — VITAMIN B1: VITAMIN B1 (THIAMINE): 15 nmol/L (ref 8–30)

## 2017-04-04 ENCOUNTER — Encounter (HOSPITAL_COMMUNITY): Payer: Self-pay

## 2017-04-14 ENCOUNTER — Other Ambulatory Visit: Payer: Self-pay | Admitting: Family Medicine

## 2017-05-20 DIAGNOSIS — J189 Pneumonia, unspecified organism: Secondary | ICD-10-CM | POA: Diagnosis not present

## 2017-06-13 ENCOUNTER — Telehealth: Payer: Self-pay | Admitting: Medical

## 2017-06-13 NOTE — Telephone Encounter (Signed)
Pt due for follow up please call and schedule appointment.  

## 2017-06-13 NOTE — Telephone Encounter (Signed)
Called patient and left LV, to please call back and sechd appointment with Ramon DredgeEdward.

## 2017-09-29 ENCOUNTER — Other Ambulatory Visit: Payer: Self-pay | Admitting: Family Medicine

## 2018-02-12 ENCOUNTER — Ambulatory Visit: Payer: BLUE CROSS/BLUE SHIELD | Admitting: Family Medicine

## 2018-02-12 VITALS — BP 144/74 | HR 86 | Temp 98.2°F | Resp 18 | Ht 73.0 in | Wt 342.0 lb

## 2018-02-12 DIAGNOSIS — E1169 Type 2 diabetes mellitus with other specified complication: Secondary | ICD-10-CM

## 2018-02-12 DIAGNOSIS — R600 Localized edema: Secondary | ICD-10-CM | POA: Diagnosis not present

## 2018-02-12 DIAGNOSIS — M25562 Pain in left knee: Secondary | ICD-10-CM

## 2018-02-12 DIAGNOSIS — G4733 Obstructive sleep apnea (adult) (pediatric): Secondary | ICD-10-CM

## 2018-02-12 DIAGNOSIS — Z9989 Dependence on other enabling machines and devices: Secondary | ICD-10-CM

## 2018-02-12 DIAGNOSIS — G8929 Other chronic pain: Secondary | ICD-10-CM

## 2018-02-12 DIAGNOSIS — I10 Essential (primary) hypertension: Secondary | ICD-10-CM

## 2018-02-12 DIAGNOSIS — E669 Obesity, unspecified: Secondary | ICD-10-CM

## 2018-02-12 DIAGNOSIS — M25561 Pain in right knee: Secondary | ICD-10-CM | POA: Diagnosis not present

## 2018-02-12 NOTE — Patient Instructions (Addendum)
Encouraged increased rest and hydration, add probiotics, zinc such as Coldeze or Xicam. Treat fevers as needed. Vitamin C 500 to 1000 mg and elderberry    Shingrix is the new shingles shot, 2 shots over 2-6 months, check with insurance and document and then call for appt if interested in shot.  Edema Edema is an abnormal buildup of fluids in your bodytissues. Edema is somewhatdependent on gravity to pull the fluid to the lowest place in your body. That makes the condition more common in the legs and thighs (lower extremities). Painless swelling of the feet and ankles is common and becomes more likely as you get older. It is also common in looser tissues, like around your eyes. When the affected area is squeezed, the fluid may move out of that spot and leave a dent for a few moments. This dent is called pitting. What are the causes? There are many possible causes of edema. Eating too much salt and being on your feet or sitting for a long time can cause edema in your legs and ankles. Hot weather may make edema worse. Common medical causes of edema include:  Heart failure.  Liver disease.  Kidney disease.  Weak blood vessels in your legs.  Cancer.  An injury.  Pregnancy.  Some medications.  Obesity.  What are the signs or symptoms? Edema is usually painless.Your skin may look swollen or shiny. How is this diagnosed? Your health care provider may be able to diagnose edema by asking about your medical history and doing a physical exam. You may need to have tests such as X-rays, an electrocardiogram, or blood tests to check for medical conditions that may cause edema. How is this treated? Edema treatment depends on the cause. If you have heart, liver, or kidney disease, you need the treatment appropriate for these conditions. General treatment may include:  Elevation of the affected body part above the level of your heart.  Compression of the affected body part. Pressure from  elastic bandages or support stockings squeezes the tissues and forces fluid back into the blood vessels. This keeps fluid from entering the tissues.  Restriction of fluid and salt intake.  Use of a water pill (diuretic). These medications are appropriate only for some types of edema. They pull fluid out of your body and make you urinate more often. This gets rid of fluid and reduces swelling, but diuretics can have side effects. Only use diuretics as directed by your health care provider.  Follow these instructions at home:  Keep the affected body part above the level of your heart when you are lying down.  Do not sit still or stand for prolonged periods.  Do not put anything directly under your knees when lying down.  Do not wear constricting clothing or garters on your upper legs.  Exercise your legs to work the fluid back into your blood vessels. This may help the swelling go down.  Wear elastic bandages or support stockings to reduce ankle swelling as directed by your health care provider.  Eat a low-salt diet to reduce fluid if your health care provider recommends it.  Only take medicines as directed by your health care provider. Contact a health care provider if:  Your edema is not responding to treatment.  You have heart, liver, or kidney disease and notice symptoms of edema.  You have edema in your legs that does not improve after elevating them.  You have sudden and unexplained weight gain. Get help right away if:  You develop shortness of breath or chest pain.  You cannot breathe when you lie down.  You develop pain, redness, or warmth in the swollen areas.  You have heart, liver, or kidney disease and suddenly get edema.  You have a fever and your symptoms suddenly get worse. This information is not intended to replace advice given to you by your health care provider. Make sure you discuss any questions you have with your health care provider. Document Released:  03/07/2005 Document Revised: 08/13/2015 Document Reviewed: 12/28/2012 Elsevier Interactive Patient Education  2017 [employer].

## 2018-02-19 DIAGNOSIS — R600 Localized edema: Secondary | ICD-10-CM | POA: Insufficient documentation

## 2018-02-19 NOTE — Assessment & Plan Note (Signed)
Worsening is ready for referral to orthopaedics due to degree of discomfort

## 2018-02-19 NOTE — Assessment & Plan Note (Signed)
Using CPAP nightly 

## 2018-02-19 NOTE — Assessment & Plan Note (Signed)
R>L check ultrasound. Elevate feet, minimize sodium and try compression hose

## 2018-02-19 NOTE — Assessment & Plan Note (Signed)
Well controlled, no changes to meds. Encouraged heart healthy diet such as the DASH diet and exercise as tolerated.  °

## 2018-02-19 NOTE — Progress Notes (Signed)
Subjective:    Patient ID: Jeffrey Molina, male    DOB: Nov 07, 1960, 57 y.o.   MRN: [id number]  Chief Complaint  Patient presents with  . Edema    RT leg/calf swelling x 4 wks.  . Nasal Congestion    HPI Patient is in today for evaluation of worsening knee pain and pedal edema. Is occurring bilaterally but is worse on the right. No recent fall or trauma. No change in diet or activity level. No long trips or periods of immobility. Denies CP/palp/SOB/HA/congestion/fevers/GI or GU c/o. Taking meds as prescribed  Past Medical History:  Diagnosis Date  . Anemia 10/28/2013   pt. stated "no" at preop appt. 08-29-14  . Arthritis    both knees  . Chicken pox as a child  . Cutaneous skin tags 10/28/2013  . Diabetes mellitus type 2 in obese (HCC) 01/27/2014  . GERD (gastroesophageal reflux disease)   . History of chicken pox   . Hypertension   . Low testosterone 10/28/2013  . Peripheral artery disease (HCC) 11/03/2013  . Preventative health care 10/26/2016  . Sleep apnea    has c-pap machine  . Tachycardia 05/15/2014    Past Surgical History:  Procedure Laterality Date  . colonscopy     x 2  . 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 Fibromyalgia Mother   . Cancer Father        skin cancer  . Cataracts Father   . Arthritis Father   . Diabetes Maternal Grandfather     Social History   Socioeconomic History  . Marital status: Married    Spouse name: Not on file  . Number of children: Not on file  . Years of education: Not on file  . Highest education level: Not on file  Occupational History  . Occupation: Event organiser: [employer]  Social Needs  . Financial resource strain: Not on file  . Food insecurity:    Worry: Not on file    Inability: Not on file  .  Transportation needs:    Medical: Not on file    Non-medical: Not on file  Tobacco Use  . Smoking status: Never Smoker  . Smokeless tobacco: Never Used  Substance and Sexual Activity  . Alcohol use: No  . Drug use: No  . Sexual activity: Yes    Comment: lives with wife and children, works for [employer]  Lifestyle  . Physical activity:    Days per week: Not on file    Minutes per session: Not on file  . Stress: Not on file  Relationships  . Social connections:    Talks on phone: Not on file    Gets together: Not on file    Attends religious service: Not on file    Active member of club or organization: Not on file    Attends meetings of clubs or organizations: Not on file    Relationship status: Not on file  . Intimate partner violence:    Fear of current or ex partner: Not on file    Emotionally abused: Not on file    Physically abused: Not on file    Forced sexual activity: Not on file  Other Topics Concern  . Not on file  Social History  Narrative  . Not on file    Outpatient Medications Prior to Visit  Medication Sig Dispense Refill  . Calcium Carb-Cholecalciferol (CALCIUM 600 + D PO) Take 1 tablet by mouth 3 (three) times daily.    . Methylcobalamin (B-12) 5000 MCG TBDP Take 1 tablet by mouth daily.    . metoprolol tartrate (LOPRESSOR) 25 MG tablet TAKE ONE-HALF (1/2) TABLET TWICE A DAY 90 tablet 3  . Multiple Vitamin (MULTIVITAMIN WITH MINERALS) TABS tablet Take 1 tablet by mouth 2 (two) times daily.    . Probiotic Product (PROBIOTIC & ACIDOPHILUS EX ST PO) Take by mouth.    . Vitamin D, Ergocalciferol, (DRISDOL) 50000 units CAPS capsule TAKE 1 CAPSULE EVERY 7 DAYS 12 capsule 1   No facility-administered medications prior to visit.     Allergies  Allergen Reactions  . Lisinopril-Hydrochlorothiazide Swelling    Lip swollen    Review of Systems  Constitutional: Positive for malaise/fatigue. Negative for fever.  HENT: Positive for congestion.     Eyes: Negative for blurred vision.  Respiratory: Positive for cough, sputum production and wheezing. Negative for shortness of breath.   Cardiovascular: Negative for chest pain, palpitations and leg swelling.  Gastrointestinal: Negative for abdominal pain, blood in stool and nausea.  Genitourinary: Negative for dysuria and frequency.  Musculoskeletal: Negative for falls.  Skin: Negative for rash.  Neurological: Negative for dizziness, loss of consciousness and headaches.  Endo/Heme/Allergies: Negative for environmental allergies.  Psychiatric/Behavioral: Negative for depression. The patient is not nervous/anxious.        Objective:    Physical Exam  Constitutional: He is oriented to person, place, and time. He appears well-developed and well-nourished. No distress.  HENT:  Head: Normocephalic and atraumatic.  Nose: Nose normal.  Eyes: Right eye exhibits no discharge. Left eye exhibits no discharge.  Neck: Normal range of motion. Neck supple.  Cardiovascular: Normal rate and regular rhythm.  No murmur heard. Pulmonary/Chest: Effort normal and breath sounds normal.  Abdominal: Soft. Bowel sounds are normal. There is no tenderness.  Musculoskeletal: He exhibits no edema.  Neurological: He is alert and oriented to person, place, and time.  Skin: Skin is warm and dry.  Psychiatric: He has a normal mood and affect.  Nursing note and vitals reviewed.   BP (!) 144/74 (BP Location: Left Arm, Patient Position: Sitting, Cuff Size: Large)   Pulse 86   Temp 98.2 F (36.8 C) (Oral)   Resp 18   Ht 6\' 1"  (1.854 m)   Wt (!) 342 lb (155.1 kg)   SpO2 95%   BMI 45.12 kg/m  Wt Readings from Last 3 Encounters:  02/12/18 (!) 342 lb (155.1 kg)  10/25/16 (!) 333 lb 6.4 oz (151.2 kg)  06/16/16 (!) 327 lb 9.6 oz (148.6 kg)     Lab Results  Component Value Date   WBC 5.3 10/25/2016   HGB 14.1 10/25/2016   HCT 42.8 10/25/2016   PLT 259.0 10/25/2016   GLUCOSE 94 10/25/2016   CHOL 145  10/25/2016   TRIG 102.0 10/25/2016   HDL 34.30 (L) 10/25/2016   LDLCALC 90 10/25/2016   ALT 13 10/25/2016   AST 13 10/25/2016   NA 138 10/25/2016   K 4.0 10/25/2016   CL 104 10/25/2016   CREATININE 0.84 10/25/2016   BUN 13 10/25/2016   CO2 33 (H) 10/25/2016   TSH 2.18 10/25/2016   HGBA1C 5.7 10/25/2016   MICROALBUR 1.2 11/07/2014    Lab Results  Component Value Date   TSH  2.18 10/25/2016   Lab Results  Component Value Date   WBC 5.3 10/25/2016   HGB 14.1 10/25/2016   HCT 42.8 10/25/2016   MCV 93.2 10/25/2016   PLT 259.0 10/25/2016   Lab Results  Component Value Date   NA 138 10/25/2016   K 4.0 10/25/2016   CO2 33 (H) 10/25/2016   GLUCOSE 94 10/25/2016   BUN 13 10/25/2016   CREATININE 0.84 10/25/2016   BILITOT 0.4 10/25/2016   ALKPHOS 60 10/25/2016   AST 13 10/25/2016   ALT 13 10/25/2016   PROT 6.8 10/25/2016   ALBUMIN 3.8 10/25/2016   CALCIUM 8.9 10/25/2016   ANIONGAP 10 09/04/2014   GFR 101.04 10/25/2016   Lab Results  Component Value Date   CHOL 145 10/25/2016   Lab Results  Component Value Date   HDL 34.30 (L) 10/25/2016   Lab Results  Component Value Date   LDLCALC 90 10/25/2016   Lab Results  Component Value Date   TRIG 102.0 10/25/2016   Lab Results  Component Value Date   CHOLHDL 4 10/25/2016   Lab Results  Component Value Date   HGBA1C 5.7 10/25/2016       Assessment & Plan:   Problem List Items Addressed This Visit    Morbid obesity (HCC) (Chronic)    Encouraged DASH diet, decrease po intake and increase exercise as tolerated. Needs 7-8 hours of sleep nightly. Avoid trans fats, eat small, frequent meals every 4-5 hours with lean proteins, complex carbs and healthy fats. Minimize simple carbs, offered referral to healthy weight and wellness.       HTN (hypertension)    Well controlled, no changes to meds. Encouraged heart healthy diet such as the DASH diet and exercise as tolerated.       OSA on CPAP    Using CPAP nightly        Knee pain, bilateral    Worsening is ready for referral to orthopaedics due to degree of discomfort      Relevant Orders   Ambulatory referral to Orthopedic Surgery    Other Visit Diagnoses    Pedal edema    -  Primary   Relevant Orders   US Venous Img Lower Unilateral Right      I am having Luretha Murphy maintain his B-12, Calcium Carb-Cholecalciferol (CALCIUM 600 + D PO), multivitamin with minerals, Probiotic Product (PROBIOTIC & ACIDOPHILUS EX ST PO), metoprolol tartrate, and Vitamin D (Ergocalciferol).  No orders of the defined types were placed in this encounter.    Danise Edge, MD

## 2018-02-19 NOTE — Assessment & Plan Note (Signed)
Encouraged DASH diet, decrease po intake and increase exercise as tolerated. Needs 7-8 hours of sleep nightly. Avoid trans fats, eat small, frequent meals every 4-5 hours with lean proteins, complex carbs and healthy fats. Minimize simple carbs, offered referral to healthy weight and wellness.  

## 2018-02-19 NOTE — Assessment & Plan Note (Signed)
hgba1c acceptable, minimize simple carbs. Increase exercise as tolerated. Continue current meds 

## 2018-02-26 ENCOUNTER — Ambulatory Visit (HOSPITAL_BASED_OUTPATIENT_CLINIC_OR_DEPARTMENT_OTHER): Payer: BLUE CROSS/BLUE SHIELD

## 2018-02-27 ENCOUNTER — Ambulatory Visit (HOSPITAL_BASED_OUTPATIENT_CLINIC_OR_DEPARTMENT_OTHER): Payer: BLUE CROSS/BLUE SHIELD

## 2018-02-28 DIAGNOSIS — M1712 Unilateral primary osteoarthritis, left knee: Secondary | ICD-10-CM | POA: Diagnosis not present

## 2018-02-28 DIAGNOSIS — M25562 Pain in left knee: Secondary | ICD-10-CM | POA: Diagnosis not present

## 2018-02-28 DIAGNOSIS — M25561 Pain in right knee: Secondary | ICD-10-CM | POA: Diagnosis not present

## 2018-02-28 DIAGNOSIS — M1711 Unilateral primary osteoarthritis, right knee: Secondary | ICD-10-CM | POA: Diagnosis not present

## 2018-03-03 ENCOUNTER — Other Ambulatory Visit (HOSPITAL_BASED_OUTPATIENT_CLINIC_OR_DEPARTMENT_OTHER): Payer: BLUE CROSS/BLUE SHIELD

## 2018-03-16 ENCOUNTER — Other Ambulatory Visit: Payer: Self-pay | Admitting: Family Medicine

## 2018-04-30 ENCOUNTER — Telehealth: Payer: Self-pay | Admitting: Family Medicine

## 2018-04-30 NOTE — Telephone Encounter (Signed)
Copied from CRM [id number]. Topic: Quick Communication - See Telephone Encounter >> Apr 30, 2018  3:12 PM Fanny Bien wrote: CRM for notification. See Telephone encounter for: 04/30/18. My called and stated that he would need a letter of medical nessesity so that he can carry his cpap machine on the airplane when he goes to [place]. Please advise

## 2018-05-01 DIAGNOSIS — G4733 Obstructive sleep apnea (adult) (pediatric): Secondary | ICD-10-CM | POA: Diagnosis not present

## 2018-05-03 NOTE — Telephone Encounter (Signed)
Yes please write his letter stating he has sleep apnea so his CPAP is medical necessity.

## 2018-05-03 NOTE — Telephone Encounter (Signed)
Please advise 

## 2018-05-04 NOTE — Telephone Encounter (Signed)
Wrote letter patient made aware

## 2018-06-08 ENCOUNTER — Other Ambulatory Visit: Payer: Self-pay | Admitting: Medical

## 2018-07-20 ENCOUNTER — Encounter: Payer: Self-pay | Admitting: Family Medicine

## 2018-08-03 DIAGNOSIS — M1712 Unilateral primary osteoarthritis, left knee: Secondary | ICD-10-CM | POA: Diagnosis not present

## 2018-08-03 DIAGNOSIS — M170 Bilateral primary osteoarthritis of knee: Secondary | ICD-10-CM | POA: Diagnosis not present

## 2018-08-03 DIAGNOSIS — M1711 Unilateral primary osteoarthritis, right knee: Secondary | ICD-10-CM | POA: Diagnosis not present

## 2018-08-29 ENCOUNTER — Encounter: Payer: Self-pay | Admitting: Family Medicine

## 2018-08-29 MED ORDER — METOPROLOL TARTRATE 25 MG PO TABS: 12.5000 mg | ORAL_TABLET | Freq: Two times a day (BID) | ORAL | 1 refills | Status: DC

## 2018-11-30 DIAGNOSIS — M170 Bilateral primary osteoarthritis of knee: Secondary | ICD-10-CM | POA: Diagnosis not present

## 2019-02-25 ENCOUNTER — Other Ambulatory Visit: Payer: Self-pay | Admitting: Family Medicine

## 2019-03-12 DIAGNOSIS — M170 Bilateral primary osteoarthritis of knee: Secondary | ICD-10-CM | POA: Diagnosis not present

## 2019-05-10 ENCOUNTER — Other Ambulatory Visit: Payer: Self-pay | Admitting: Family Medicine

## 2019-05-21 ENCOUNTER — Encounter: Payer: Self-pay | Admitting: Family Medicine

## 2019-05-26 ENCOUNTER — Other Ambulatory Visit: Payer: Self-pay | Admitting: Family Medicine

## 2019-06-12 DIAGNOSIS — M170 Bilateral primary osteoarthritis of knee: Secondary | ICD-10-CM | POA: Diagnosis not present

## 2019-09-12 DIAGNOSIS — M170 Bilateral primary osteoarthritis of knee: Secondary | ICD-10-CM | POA: Diagnosis not present

## 2020-01-10 DIAGNOSIS — M170 Bilateral primary osteoarthritis of knee: Secondary | ICD-10-CM | POA: Diagnosis not present

## 2020-04-10 ENCOUNTER — Other Ambulatory Visit: Payer: Self-pay | Admitting: Family Medicine

## 2020-04-16 ENCOUNTER — Ambulatory Visit: Payer: BC Managed Care – PPO | Admitting: Family Medicine

## 2020-04-16 ENCOUNTER — Other Ambulatory Visit: Payer: Self-pay

## 2020-04-16 ENCOUNTER — Encounter: Payer: Self-pay | Admitting: Family Medicine

## 2020-04-16 VITALS — BP 150/90 | HR 69 | Temp 98.3°F | Ht 73.0 in | Wt 348.0 lb

## 2020-04-16 DIAGNOSIS — Z23 Encounter for immunization: Secondary | ICD-10-CM

## 2020-04-16 DIAGNOSIS — M170 Bilateral primary osteoarthritis of knee: Secondary | ICD-10-CM | POA: Diagnosis not present

## 2020-04-16 DIAGNOSIS — M25552 Pain in left hip: Secondary | ICD-10-CM

## 2020-04-16 MED ORDER — PREDNISONE 10 MG PO TABS: ORAL_TABLET | ORAL | 0 refills | Status: DC

## 2020-04-16 NOTE — Patient Instructions (Signed)
Osteoarthritis  Osteoarthritis is a type of arthritis. It refers to joint pain or joint disease. Osteoarthritis affects tissue that covers the ends of bones in joints (cartilage). Cartilage acts as a cushion between the bones and helps them move smoothly. Osteoarthritis occurs when cartilage in the joints gets worn down. Osteoarthritis is sometimes called "wear and tear" arthritis. Osteoarthritis is the most common form of arthritis. It often occurs in older people. It is a condition that gets worse over time. The joints most often affected by this condition are in the fingers, toes, hips, knees, and spine, including the neck and lower back. What are the causes? This condition is caused by the wearing down of cartilage that covers the ends of bones. What increases the risk? The following factors may make you more likely to develop this condition:  Being age 50 or older.  Obesity.  Overuse of joints.  Past injury of a joint.  Past surgery on a joint.  Family history of osteoarthritis. What are the signs or symptoms? The main symptoms of this condition are pain, swelling, and stiffness in the joint. Other symptoms may include:  An enlarged joint.  More pain and further damage caused by small pieces of bone or cartilage that break off and float inside of the joint.  Small deposits of bone (osteophytes) that grow on the edges of the joint.  A grating or scraping feeling inside the joint when you move it.  Popping or creaking sounds when you move.  Difficulty walking or exercising.  An inability to grip items, twist your hand(s), or control the movements of your hands and fingers. How is this diagnosed? This condition may be diagnosed based on:  Your medical history.  A physical exam.  Your symptoms.  X-rays of the affected joint(s).  Blood tests to rule out other types of arthritis. How is this treated? There is no cure for this condition, but treatment can help control  pain and improve joint function. Treatment may include a combination of therapies, such as:  Pain relief techniques, such as: ? Applying heat and cold to the joint. ? Massage. ? A form of talk therapy called cognitive behavioral therapy (CBT). This therapy helps you set goals and follow up on the changes that you make.  Medicines for pain and inflammation. The medicines can be taken by mouth or applied to the skin. They include: ? NSAIDs, such as ibuprofen. ? Prescription medicines. ? Strong anti-inflammatory medicines (corticosteroids). ? Certain nutritional supplements.  A prescribed exercise program. You may work with a physical therapist.  Assistive devices, such as a brace, wrap, splint, specialized glove, or cane.  A weight control plan.  Surgery, such as: ? An osteotomy. This is done to reposition the bones and relieve pain or to remove loose pieces of bone and cartilage. ? Joint replacement surgery. You may need this surgery if you have advanced osteoarthritis. Follow these instructions at home: Activity  Rest your affected joints as told by your health care provider.  Exercise as told by your health care provider. He or she may recommend specific types of exercise, such as: ? Strengthening exercises. These are done to strengthen the muscles that support joints affected by arthritis. ? Aerobic activities. These are exercises, such as brisk walking or water aerobics, that increase your heart rate. ? Range-of-motion activities. These help your joints move more easily. ? Balance and agility exercises. Managing pain, stiffness, and swelling  If directed, apply heat to the affected area as often   as told by your health care provider. Use the heat source that your health care provider recommends, such as a moist heat pack or a heating pad. ? If you have a removable assistive device, remove it as told by your health care provider. ? Place a towel between your skin and the heat  source. If your health care provider tells you to keep the assistive device on while you apply heat, place a towel between the assistive device and the heat source. ? Leave the heat on for 20-30 minutes. ? Remove the heat if your skin turns bright red. This is especially important if you are unable to feel pain, heat, or cold. You may have a greater risk of getting burned.  If directed, put ice on the affected area. To do this: ? If you have a removable assistive device, remove it as told by your health care provider. ? Put ice in a plastic bag. ? Place a towel between your skin and the bag. If your health care provider tells you to keep the assistive device on during icing, place a towel between the assistive device and the bag. ? Leave the ice on for 20 minutes, 2-3 times a day. ? Move your fingers or toes often to reduce stiffness and swelling. ? Raise (elevate) the injured area above the level of your heart while you are sitting or lying down.      General instructions  Take over-the-counter and prescription medicines only as told by your health care provider.  Maintain a healthy weight. Follow instructions from your health care provider for weight control.  Do not use any products that contain nicotine or tobacco, such as cigarettes, e-cigarettes, and chewing tobacco. If you need help quitting, ask your health care provider.  Use assistive devices as told by your health care provider.  Keep all follow-up visits as told by your health care provider. This is important. Where to find more information  National Institute of Arthritis and Musculoskeletal and Skin Diseases: www.niams.nih.gov  National Institute on Aging: www.nia.nih.gov  American College of Rheumatology: www.rheumatology.org Contact a health care provider if:  You have redness, swelling, or a feeling of warmth in a joint that gets worse.  You have a fever along with joint or muscle aches.  You develop a  rash.  You have trouble doing your normal activities. Get help right away if:  You have pain that gets worse and is not relieved by pain medicine. Summary  Osteoarthritis is a type of arthritis that affects tissue covering the ends of bones in joints (cartilage).  This condition is caused by the wearing down of cartilage that covers the ends of bones.  The main symptom of this condition is pain, swelling, and stiffness in the joint.  There is no cure for this condition, but treatment can help control pain and improve joint function. This information is not intended to replace advice given to you by your health care provider. Make sure you discuss any questions you have with your health care provider. Document Revised: 03/04/2019 Document Reviewed: 03/04/2019 Elsevier Patient Education  2021 [employer]  

## 2020-04-16 NOTE — Assessment & Plan Note (Signed)
Pred taper  F/u ortho if no better

## 2020-04-16 NOTE — Assessment & Plan Note (Signed)
?   From low back  pred taper -- if no better f/u pcp to evaluate further

## 2020-04-16 NOTE — Progress Notes (Signed)
Patient ID: Jeffrey Molina, male    DOB: 1960-09-20  Age: 60 y.o. MRN: [id number]    Subjective:  Subjective  HPI Jeffrey Molina presents for L knee/ leg pain --- he usually sees ortho for injections and is behind on this---- he also now c/o L hip pain that radiates down L leg  Review of Systems  Constitutional: Negative for appetite change, diaphoresis, fatigue and unexpected weight change.  Eyes: Negative for pain, redness and visual disturbance.  Respiratory: Negative for cough, chest tightness, shortness of breath and wheezing.   Cardiovascular: Negative for chest pain, palpitations and leg swelling.  Endocrine: Negative for cold intolerance, heat intolerance, polydipsia, polyphagia and polyuria.  Genitourinary: Negative for difficulty urinating, dysuria and frequency.  Neurological: Negative for dizziness, light-headedness, numbness and headaches.    History Past Medical History:  Diagnosis Date  . Anemia 10/28/2013   pt. stated "no" at preop appt. 08-29-14  . Arthritis    both knees  . Chicken pox as a child  . Cutaneous skin tags 10/28/2013  . Diabetes mellitus type 2 in obese (HCC) 01/27/2014  . GERD (gastroesophageal reflux disease)   . History of chicken pox   . Hypertension   . Low testosterone 10/28/2013  . Peripheral artery disease (HCC) 11/03/2013  . Preventative health care 10/26/2016  . Sleep apnea    has c-pap machine  . 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   His family history includes Arthritis in his father; Cancer in his father; Cataracts in his father; Diabetes in his maternal grandfather; Fibromyalgia in his mother.He reports that he has never smoked. He has never used smokeless tobacco. He reports that he does not drink alcohol and does not use drugs.  Current Outpatient Medications on File Prior to Visit  Medication Sig Dispense Refill  . Calcium Carb-Cholecalciferol  (CALCIUM 600 + D PO) Take 1 tablet by mouth 3 (three) times daily.    . Methylcobalamin (B-12) 5000 MCG TBDP Take 1 tablet by mouth daily.    . metoprolol tartrate (LOPRESSOR) 25 MG tablet TAKE ONE-HALF (1/2) TABLET TWICE A DAY (NEED APPOINTMENT FOR FURTHER REFILLS) 90 tablet 3  . Multiple Vitamin (MULTIVITAMIN WITH MINERALS) TABS tablet Take 1 tablet by mouth 2 (two) times daily.    . Probiotic Product (PROBIOTIC & ACIDOPHILUS EX ST PO) Take by mouth.    . Vitamin D, Ergocalciferol, (DRISDOL) 1.25 MG (50000 UNIT) CAPS capsule TAKE 1 CAPSULE EVERY 7 DAYS 12 capsule 3   No current facility-administered medications on file prior to visit.     Objective:  Objective  Physical Exam Vitals and nursing note reviewed.  Constitutional:      General: He is sleeping. Vital signs are normal.     Appearance: He is well-developed and well-nourished.  HENT:     Head: Normocephalic and atraumatic.     Mouth/Throat:     Mouth: Oropharynx is clear and moist.  Eyes:     Extraocular Movements: EOM normal.     Pupils: Pupils are equal, round, and reactive to light.  Neck:     Thyroid: No thyromegaly.  Cardiovascular:     Rate and Rhythm: Normal rate and regular rhythm.     Heart sounds: No murmur heard.   Pulmonary:     Effort: Pulmonary effort is normal. No respiratory distress.     Breath sounds: Normal breath sounds. No wheezing or rales.  Chest:  Chest wall: No tenderness.  Musculoskeletal:        General: No tenderness or edema.     Cervical back: Normal range of motion and neck supple.     Left lower leg: No edema.     Comments: No pain with palpation calves  Pain only with weight bearing   Skin:    General: Skin is warm and dry.  Neurological:     Mental Status: He is oriented to person, place, and time.     Motor: No weakness.     Gait: Gait normal.     Deep Tendon Reflexes: Reflexes normal.  Psychiatric:        Mood and Affect: Mood and affect normal.        Behavior:  Behavior normal.        Thought Content: Thought content normal.        Judgment: Judgment normal.    BP (!) 150/90 (BP Location: Right Arm, Patient Position: Sitting, Cuff Size: Large)   Pulse 69   Temp 98.3 F (36.8 C) (Oral)   Ht 6\' 1"  (1.854 m)   Wt (!) 348 lb (157.9 kg)   SpO2 98%   BMI 45.91 kg/m  Wt Readings from Last 3 Encounters:  04/16/20 (!) 348 lb (157.9 kg)  02/12/18 (!) 342 lb (155.1 kg)  10/25/16 (!) 333 lb 6.4 oz (151.2 kg)     Lab Results  Component Value Date   WBC 5.3 10/25/2016   HGB 14.1 10/25/2016   HCT 42.8 10/25/2016   PLT 259.0 10/25/2016   GLUCOSE 94 10/25/2016   CHOL 145 10/25/2016   TRIG 102.0 10/25/2016   HDL 34.30 (L) 10/25/2016   LDLCALC 90 10/25/2016   ALT 13 10/25/2016   AST 13 10/25/2016   NA 138 10/25/2016   K 4.0 10/25/2016   CL 104 10/25/2016   CREATININE 0.84 10/25/2016   BUN 13 10/25/2016   CO2 33 (H) 10/25/2016   TSH 2.18 10/25/2016   HGBA1C 5.7 10/25/2016   MICROALBUR 1.2 11/07/2014    DG Chest 2 View  Result Date: 02/19/2015 CLINICAL DATA:  Prominence of the lower sternum beginning 2-3 weeks ago. History of recent weight loss surgery. EXAM: CHEST  2 VIEW COMPARISON:  04/21/2014 FINDINGS: Cardiac silhouette normal in size and configuration. Normal mediastinal and hilar contours. Clear lungs.  No pleural effusion or pneumothorax. Mild degenerative endplate spurring is noted along the thoracic spine. No sternal fracture or lesion. Tip of the xiphoid does appear to protrude somewhat anteriorly, similar to the prior chest radiographs. IMPRESSION: No active cardiopulmonary disease. Electronically Signed   By: 06/20/2014 M.D.   On: 02/19/2015 19:50     Assessment & Plan:  Plan  I am having [facility] start on predniSONE. I am also having him maintain his B-12, Calcium Carb-Cholecalciferol (CALCIUM 600 + D PO), multivitamin with minerals, Probiotic Product (PROBIOTIC & ACIDOPHILUS EX ST PO), Vitamin D (Ergocalciferol), and  metoprolol tartrate.  Meds ordered this encounter  Medications  . 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 From low back  pred taper -- if no better f/u pcp to evaluate further        Other Visit Diagnoses    Need for shingles vaccine       Relevant Orders   Varicella-zoster vaccine IM (Completed)      Follow-up: Return in about 2 months (around 06/14/2020), or if symptoms worsen or fail to improve, for shingrix #2.  Donato Schultz, DO

## 2020-05-07 DIAGNOSIS — M170 Bilateral primary osteoarthritis of knee: Secondary | ICD-10-CM | POA: Diagnosis not present

## 2020-05-20 ENCOUNTER — Other Ambulatory Visit: Payer: Self-pay | Admitting: Family Medicine

## 2020-07-02 ENCOUNTER — Encounter: Payer: Self-pay | Admitting: Family Medicine

## 2020-07-07 NOTE — Telephone Encounter (Signed)
LVM to call back.

## 2020-07-08 NOTE — Telephone Encounter (Signed)
Pt will be seen Monday and script will be sent in then

## 2020-07-13 ENCOUNTER — Ambulatory Visit (HOSPITAL_BASED_OUTPATIENT_CLINIC_OR_DEPARTMENT_OTHER)
Admission: RE | Admit: 2020-07-13 | Discharge: 2020-07-13 | Disposition: A | Discharge status: Home / Self Care | Payer: BC Managed Care – PPO | Source: Ambulatory Visit | Attending: Medical | Admitting: Medical

## 2020-07-13 ENCOUNTER — Other Ambulatory Visit: Payer: Self-pay

## 2020-07-13 ENCOUNTER — Ambulatory Visit: Payer: BC Managed Care – PPO | Admitting: Medical

## 2020-07-13 ENCOUNTER — Telehealth: Payer: Self-pay | Admitting: Internal Medicine

## 2020-07-13 ENCOUNTER — Encounter: Payer: Self-pay | Admitting: Medical

## 2020-07-13 VITALS — BP 137/77 | HR 76 | Resp 18 | Ht 73.0 in | Wt 348.0 lb

## 2020-07-13 DIAGNOSIS — M7989 Other specified soft tissue disorders: Secondary | ICD-10-CM | POA: Diagnosis not present

## 2020-07-13 DIAGNOSIS — Z125 Encounter for screening for malignant neoplasm of prostate: Secondary | ICD-10-CM

## 2020-07-13 DIAGNOSIS — I10 Essential (primary) hypertension: Secondary | ICD-10-CM

## 2020-07-13 DIAGNOSIS — Z23 Encounter for immunization: Secondary | ICD-10-CM | POA: Diagnosis not present

## 2020-07-13 DIAGNOSIS — R739 Hyperglycemia, unspecified: Secondary | ICD-10-CM

## 2020-07-13 DIAGNOSIS — M25461 Effusion, right knee: Secondary | ICD-10-CM | POA: Diagnosis not present

## 2020-07-13 DIAGNOSIS — N529 Male erectile dysfunction, unspecified: Secondary | ICD-10-CM

## 2020-07-13 DIAGNOSIS — T148XXA Other injury of unspecified body region, initial encounter: Secondary | ICD-10-CM

## 2020-07-13 DIAGNOSIS — E559 Vitamin D deficiency, unspecified: Secondary | ICD-10-CM | POA: Diagnosis not present

## 2020-07-13 DIAGNOSIS — R600 Localized edema: Secondary | ICD-10-CM | POA: Diagnosis not present

## 2020-07-13 MED ORDER — TRAMADOL HCL 50 MG PO TABS: 50.0000 mg | ORAL_TABLET | Freq: Four times a day (QID) | ORAL | 0 refills | Status: AC | PRN

## 2020-07-13 MED ORDER — DICLOFENAC SODIUM 75 MG PO TBEC: 75.0000 mg | DELAYED_RELEASE_TABLET | Freq: Two times a day (BID) | ORAL | 0 refills | Status: DC

## 2020-07-13 MED ORDER — SILDENAFIL CITRATE 50 MG PO TABS: 50.0000 mg | ORAL_TABLET | Freq: Every day | ORAL | 0 refills | Status: DC | PRN

## 2020-07-13 NOTE — Patient Instructions (Addendum)
For rt lower ext swelling will get rt lower ext [place] stat. If negative for DVT consider referral to vascular surgeon as degree of swelling severe/possible lymphedema. If positive for dvt start xarelto. Notify pt by my chart or call after hours.  For chronic knee pain with likely need for knee replacements rx diclofenac and rx limited tramadol rx. Please get back in with orthopedist to discuss if ortho plans for knee replacement. Let me know what plans are. Will need to see if your pcp willing to give tramadol or other narcotic until possible planned replacement. Rx advisement given  For htn continue metoprolol. Relatively well controlled/not ideal control presently. Not nsaid can increase bp.   For elevated blood sugar get future a1c and cmp.  For high cholesterol placed future lipid panel  For possible random intermittent bruise to rt forearm placed future cbc.  For hx of ED rx viagra sent to pt pharmacy.  Placed future screening psa.  Shingles vaccine today.  For low vit d history placed future vit D level.  Follow up date to be determined after lab and imaging review.

## 2020-07-13 NOTE — Addendum Note (Signed)
Addended by: Gwenevere Abbot on: 07/13/2020 08:49 PM   Modules accepted: Orders

## 2020-07-13 NOTE — Telephone Encounter (Signed)
Received call - stat lower extremity ultrasound results.  No DVT.  Lower extremity edema and what appears to represent a suprapatellar joint effusion.  Called and notified Jeffrey Molina of results.  Describes no acute change with his leg.  No redness.  No signs of infection (per pt).  Reviewed office note.  Discussed possible f/u with ortho.  Explained PCP office will call and notify him of next step.

## 2020-07-13 NOTE — Telephone Encounter (Signed)
Thanks for your help with Jeffrey Molina.

## 2020-07-13 NOTE — Progress Notes (Signed)
Subjective:    Patient ID: Jeffrey Molina, male    DOB: 1960-08-05, 60 y.o.   MRN: [id number]  HPI  Pt in for some bilateral knee pain. Pt states pain been increasing over past year. Pain for 4 years. Pt told has degenerative knees. He was told would eventually need knee replacements.  Pt describes that he has no cartlige on either knee. [facility]. I tried to look up [facility] notes and xrays but could not find in epic.   Pt state past year injections were only stopping pain for one month.  Pt level is 4-6/10.    If needs knee replacement would like to get surgery beginning of next year.  Pt has hx of htn. Pt is on metoprolol 25 mg. 1/2 tab po q day. No cardiac or neurologic signs or symptoms.  Hx of high cholesterol. On review no lipid med seen.  Pt does have history of erectile dysfunction. No use of nitro and no history of chest pain.  Also has left wrist intermittent mild purplish red rash. Will appear random and resolve in about 3 days. No itching to skin.  Hx of elevated sugar.  Review of Systems  Constitutional: Negative for chills, fatigue and fever.  HENT: Negative for congestion.   Respiratory: Negative for cough, chest tightness, shortness of breath and wheezing.   Cardiovascular: Negative for chest pain and palpitations.  Gastrointestinal: Negative for abdominal pain.  Genitourinary: Negative for decreased urine volume, difficulty urinating, enuresis, frequency, penile discharge, penile pain and testicular pain.       ED.   Musculoskeletal:       Knee pain bilaterally.  Skin:       See  hpi.  Neurological: Negative for dizziness, facial asymmetry, light-headedness and headaches.    Past Medical History:  Diagnosis Date  . Anemia 10/28/2013   pt. stated "no" at preop appt. 08-29-14  . Arthritis    both knees  . Chicken pox as a child  . Cutaneous skin tags 10/28/2013  . Diabetes mellitus type 2 in obese (HCC) 01/27/2014  . GERD  (gastroesophageal reflux disease)   . History of chicken pox   . Hypertension   . Low testosterone 10/28/2013  . Peripheral artery disease (HCC) 11/03/2013  . Preventative health care 10/26/2016  . Sleep apnea    has c-pap machine  . Tachycardia 05/15/2014     Social History   Socioeconomic History  . Marital status: Married    Spouse name: Not on file  . Number of children: Not on file  . Years of education: Not on file  . Highest education level: Not on file  Occupational History  . Occupation: Event organiser: [employer]  Tobacco Use  . Smoking status: Never Smoker  . Smokeless tobacco: Never Used  Vaping Use  . Vaping Use: Never used  Substance and Sexual Activity  . Alcohol use: No  . Drug use: No  . Sexual activity: Yes    Comment: lives with wife and children, works for [employer]  Other Topics Concern  . Not on file  Social History Narrative  . Not on file   Social Determinants of Health   Financial Resource Strain: Not on file  Food Insecurity: Not on file  Transportation Needs: Not on file  Physical Activity: Not on file  Stress: Not on file  Social Connections: Not on file  Intimate Partner Violence: Not on file  Past Surgical History:  Procedure Laterality Date  . colonscopy     x 2  . 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 Fibromyalgia Mother   . Cancer Father        skin cancer  . Cataracts Father   . Arthritis Father   . Diabetes Maternal Grandfather     Allergies  Allergen Reactions  . Lisinopril-Hydrochlorothiazide Swelling    Lip swollen    Current Outpatient Medications on File Prior to Visit  Medication Sig Dispense Refill  . Calcium Carb-Cholecalciferol (CALCIUM 600 + D PO) Take 1 tablet by mouth 3  (three) times daily.    . Methylcobalamin (B-12) 5000 MCG TBDP Take 1 tablet by mouth daily.    . metoprolol tartrate (LOPRESSOR) 25 MG tablet Take 0.5 tablets (12.5 mg total) by mouth 2 (two) times daily. 90 tablet 1  . Multiple Vitamin (MULTIVITAMIN WITH MINERALS) TABS tablet Take 1 tablet by mouth 2 (two) times daily.    . Probiotic Product (PROBIOTIC & ACIDOPHILUS EX ST PO) Take by mouth.    . Vitamin D, Ergocalciferol, (DRISDOL) 1.25 MG (50000 UNIT) CAPS capsule TAKE 1 CAPSULE EVERY 7 DAYS 12 capsule 3   No current facility-administered medications on file prior to visit.    BP 137/77   Pulse 76   Resp 18   Ht 6\' 1"  (1.854 m)   Wt (!) 348 lb (157.9 kg)   SpO2 97%   BMI 45.91 kg/m       Objective:   Physical Exam  General Mental Status- Alert. General Appearance- Not in acute distress.   Skin General: Color- Normal Color. Moisture- Normal Moisture.  Neck Carotid Arteries- Normal color. Moisture- Normal Moisture. No carotid bruits. No JVD.  Chest and Lung Exam Auscultation: Breath Sounds:-Normal.  Cardiovascular Auscultation:Rythm- Regular. Murmurs & Other Heart Sounds:Auscultation of the heart reveals- No Murmurs.  Abdomen Inspection:-Inspeection Normal. Palpation/Percussion:Note:No mass. Palpation and Percussion of the abdomen reveal- Non Tender, Non Distended + BS, no rebound or guarding.   Neurologic Cranial Nerve exam:- CN III-XII intact(No nystagmus), symmetric smile. Strength:- 5/5 equal and symmetric strength both upper and lower extremities.   Rt knee- obvious pain on anterior knee on extension. moderarte pain on palpation anterior aspect. No redness or warmth. Mild swelling.  Rt lower ext- 2-3+ edema. Much more swollen compared to the left.   Left knee- good rom. No obvious pain on palpation. No crepitus.   Left forearm- small purple red area dorsal forearm.      Assessment & Plan:  For rt lower ext swelling will get rt lower ext stat. If  negative for DVT consider referral to vascular surgeon as degree of swelling severe/possible lymphedema. If positive for dvt start xarelto. Notify pt by my chart or call after hours.  For chronic knee pain with likely need for knee replacements rx diclofenac and rx limited tramadol rx. Please get back in with orthopedist to discuss if ortho plans for knee replacement. Let me know what plans are. Will need to see if your pcp willing to give tramadol or other narcotic until possible planned replacement. Rx advisement given  For htn continue metoprolol. Relatively well controlled/not ideal control presently. Not nsaid can increase bp.   For elevated blood sugar get future a1c and cmp.  For high cholesterol placed future lipid panel  For possible random intermittent bruise to rt forearm placed future cbc.  For hx of ED rx viagra sent to pt pharmacy.  Placed future screening psa.  Shingles vaccine today.  For low vit d history placed future vit D level.  Follow up date to be determined after lab and imaging review.  Esperanza Richters, PA-C   Time spent with patient today was 48  minutes which consisted of chart rediew, discussing diagnosis, work up treatment and documentation.

## 2020-07-13 NOTE — Telephone Encounter (Signed)
Thanks for helping this patient. Did you discuss f/u with ortho or would you like me too?

## 2020-07-14 ENCOUNTER — Telehealth: Payer: Self-pay

## 2020-07-14 NOTE — Telephone Encounter (Signed)
Pt sees ortho regularly. I tried to review [facility] note and could not access. Not sure if they have epic access? Or who/which MD to send message to.  I can put in referral for patient. He is already established and asked him to follow up with them.  Pt  is under impression that ortho wants to do knee replacements but pt wants to wait until the beginning of the year.   I could not see his xrays in epic?

## 2020-07-14 NOTE — Telephone Encounter (Signed)
Ok. Hopefully he will follow thru and call them.  Thanks.  Ramon Dredge

## 2020-07-14 NOTE — Telephone Encounter (Signed)
[facility] Night - Client Client Site [facility] - Night Physician [place], Ramon Dredge - [place] Contact Type Call Who Is Calling Physician / Provider / Hospital Call Type Provider Call [facility] Page Now Reason for Call Request to speak to Physician Initial Comment Elnita Maxwell from [facility] Radiology [phone] has a STAT result to report. Patient Name Zackarie Chason Patient DOB 03-29-1960 Requesting Provider [facility] Physician Number [phone] Facility Name [facility] Radiology Room Number NA Disp. Time Disposition Final User 07/13/2020 5:57:54 PM Send to [facility] Paging Queue Einar Pheasant 07/13/2020 6:24:02 PM Called On-Call Provider Day, Grover Canavan 07/13/2020 6:24:21 PM Page Completed Yes Day, Angelia Mould DoctorName Phone DateTime Result/Outcome Message Type Notes Dale Carson - MD [phone] 07/13/2020 6:24:02 PM Called On Call Provider - Reached Doctor Paged Dale Dalmatia - MD 07/13/2020 6:24:09 PM Spoke with On Call - General Message Result Call Closed By: Grover Canavan Day Transaction Date/Time: 07/13/2020 5:54:36 PM (ET)

## 2020-07-14 NOTE — Telephone Encounter (Signed)
[facility] does not have Epic in the office. Unfortunately for him there is not much we can do for the fluid, even if they want to do a TKR they might be willing to drain it. He should just call them and make an appt if he is already an established patient.

## 2020-07-21 ENCOUNTER — Other Ambulatory Visit (INDEPENDENT_AMBULATORY_CARE_PROVIDER_SITE_OTHER): Payer: BC Managed Care – PPO

## 2020-07-21 ENCOUNTER — Other Ambulatory Visit: Payer: Self-pay

## 2020-07-21 DIAGNOSIS — R739 Hyperglycemia, unspecified: Secondary | ICD-10-CM

## 2020-07-21 DIAGNOSIS — Z125 Encounter for screening for malignant neoplasm of prostate: Secondary | ICD-10-CM | POA: Diagnosis not present

## 2020-07-21 DIAGNOSIS — T148XXA Other injury of unspecified body region, initial encounter: Secondary | ICD-10-CM

## 2020-07-21 DIAGNOSIS — E559 Vitamin D deficiency, unspecified: Secondary | ICD-10-CM

## 2020-07-21 DIAGNOSIS — I10 Essential (primary) hypertension: Secondary | ICD-10-CM | POA: Diagnosis not present

## 2020-07-21 LAB — CBC WITH DIFFERENTIAL/PLATELET
Basophils Absolute: 0.1 10*3/uL (ref 0.0–0.1)
Basophils Relative: 2 % (ref 0.0–3.0)
Eosinophils Absolute: 0.4 10*3/uL (ref 0.0–0.7)
Eosinophils Relative: 6.7 % — ABNORMAL HIGH (ref 0.0–5.0)
HCT: 39.7 % (ref 39.0–52.0)
Hemoglobin: 13.1 g/dL (ref 13.0–17.0)
Lymphocytes Relative: 23.4 % (ref 12.0–46.0)
Lymphs Abs: 1.3 10*3/uL (ref 0.7–4.0)
MCHC: 33 g/dL (ref 30.0–36.0)
MCV: 88.9 fl (ref 78.0–100.0)
Monocytes Absolute: 0.5 10*3/uL (ref 0.1–1.0)
Monocytes Relative: 8.1 % (ref 3.0–12.0)
Neutro Abs: 3.4 10*3/uL (ref 1.4–7.7)
Neutrophils Relative %: 59.8 % (ref 43.0–77.0)
Platelets: 306 10*3/uL (ref 150.0–400.0)
RBC: 4.46 Mil/uL (ref 4.22–5.81)
RDW: 14.4 % (ref 11.5–15.5)
WBC: 5.7 10*3/uL (ref 4.0–10.5)

## 2020-07-21 LAB — COMPREHENSIVE METABOLIC PANEL
ALT: 15 U/L (ref 0–53)
AST: 14 U/L (ref 0–37)
Albumin: 3.7 g/dL (ref 3.5–5.2)
Alkaline Phosphatase: 56 U/L (ref 39–117)
BUN: 12 mg/dL (ref 6–23)
CO2: 28 mEq/L (ref 19–32)
Calcium: 8.9 mg/dL (ref 8.4–10.5)
Chloride: 103 mEq/L (ref 96–112)
Creatinine, Ser: 0.96 mg/dL (ref 0.40–1.50)
GFR: 87.33 mL/min (ref >60.00)
Glucose, Bld: 152 mg/dL — ABNORMAL HIGH (ref 70–99)
Potassium: 4 mEq/L (ref 3.5–5.1)
Sodium: 140 mEq/L (ref 135–145)
Total Bilirubin: 0.5 mg/dL (ref 0.2–1.2)
Total Protein: 6.2 g/dL (ref 6.0–8.3)

## 2020-07-21 LAB — LIPID PANEL
Cholesterol: 170 mg/dL (ref 0–200)
HDL: 34.1 mg/dL — ABNORMAL LOW (ref >39.00)
LDL Cholesterol: 104 mg/dL — ABNORMAL HIGH (ref 0–99)
NonHDL: 136
Total CHOL/HDL Ratio: 5
Triglycerides: 158 mg/dL — ABNORMAL HIGH (ref 0.0–149.0)
VLDL: 31.6 mg/dL (ref 0.0–40.0)

## 2020-07-21 LAB — HEMOGLOBIN A1C: Hgb A1c MFr Bld: 6.2 % (ref 4.6–6.5)

## 2020-07-21 LAB — PSA: PSA: 0.75 ng/mL (ref 0.10–4.00)

## 2020-07-24 LAB — VITAMIN D 1,25 DIHYDROXY
Vitamin D 1, 25 (OH)2 Total: 53 pg/mL (ref 18–72)
Vitamin D2 1, 25 (OH)2: 29 pg/mL
Vitamin D3 1, 25 (OH)2: 24 pg/mL

## 2020-07-27 ENCOUNTER — Telehealth: Payer: Self-pay | Admitting: Medical

## 2020-07-27 ENCOUNTER — Encounter: Payer: Self-pay | Admitting: Medical

## 2020-07-27 MED ORDER — DICLOFENAC SODIUM 75 MG PO TBEC: 75.0000 mg | DELAYED_RELEASE_TABLET | Freq: Two times a day (BID) | ORAL | 0 refills | Status: DC

## 2020-07-27 MED ORDER — METFORMIN HCL 500 MG PO TABS: 500.0000 mg | ORAL_TABLET | Freq: Every day | ORAL | 3 refills | Status: AC

## 2020-07-27 NOTE — Telephone Encounter (Signed)
Opened to review 

## 2020-07-27 NOTE — Telephone Encounter (Signed)
Rx metformin sent to pt pharmacy. 

## 2020-08-04 DIAGNOSIS — M170 Bilateral primary osteoarthritis of knee: Secondary | ICD-10-CM | POA: Diagnosis not present

## 2020-08-14 ENCOUNTER — Ambulatory Visit: Payer: BC Managed Care – PPO

## 2020-09-10 ENCOUNTER — Ambulatory Visit: Payer: BC Managed Care – PPO | Admitting: Family

## 2020-09-10 ENCOUNTER — Other Ambulatory Visit: Payer: Self-pay

## 2020-09-10 ENCOUNTER — Encounter: Payer: Self-pay | Admitting: Family

## 2020-09-10 VITALS — BP 136/70 | HR 105 | Temp 99.8°F | Ht 72.0 in | Wt 337.2 lb

## 2020-09-10 DIAGNOSIS — R300 Dysuria: Secondary | ICD-10-CM

## 2020-09-10 DIAGNOSIS — M170 Bilateral primary osteoarthritis of knee: Secondary | ICD-10-CM

## 2020-09-10 DIAGNOSIS — R350 Frequency of micturition: Secondary | ICD-10-CM | POA: Diagnosis not present

## 2020-09-10 LAB — POCT URINALYSIS DIP (CLINITEK)
Glucose, UA: NEGATIVE mg/dL
Nitrite, UA: NEGATIVE
POC PROTEIN,UA: 100 — AB
Spec Grav, UA: >=1.03 — AB (ref 1.010–1.025)
Urobilinogen, UA: 1 E.U./dL
pH, UA: 6 (ref 5.0–8.0)

## 2020-09-10 MED ORDER — CIPROFLOXACIN HCL 500 MG PO TABS: 500.0000 mg | ORAL_TABLET | Freq: Two times a day (BID) | ORAL | 0 refills | Status: AC

## 2020-09-10 MED ORDER — DICLOFENAC SODIUM 75 MG PO TBEC: 75.0000 mg | DELAYED_RELEASE_TABLET | Freq: Two times a day (BID) | ORAL | 0 refills | Status: DC

## 2020-09-10 NOTE — Progress Notes (Signed)
mie

## 2020-09-10 NOTE — Progress Notes (Signed)
Jeffrey Molina is a 60 y.o. male with the following history as recorded in [employer]:  Patient Active Problem List   Diagnosis Date Noted   Left hip pain 04/16/2020   Pedal edema 02/19/2018   Preventative health care 10/26/2016   Dyspnea on exertion 05/21/2015   Mass of sternum 02/22/2015   Pain of right hand 01/04/2015   Diarrhea 12/13/2014   S/P gastric bypass 09/02/2014   Dermatitis 07/13/2014   Tachycardia 05/15/2014   Diabetes mellitus type 2 in obese (HCC) 01/27/2014   Knee pain, bilateral 11/03/2013   Peripheral artery disease (HCC) 11/03/2013   Hyperlipidemia 10/28/2013   OSA on CPAP 10/28/2013   Erectile dysfunction 10/28/2013   Low testosterone 10/28/2013   Cutaneous skin tags 10/28/2013   Anemia 10/28/2013   History of chicken pox    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     ciprofloxacin (CIPRO) 500 MG tablet Take 1 tablet (500 mg total) by mouth 2 (two) times daily for 10 days. 20 tablet 0   metFORMIN (GLUCOPHAGE) 500 MG tablet Take 1 tablet (500 mg total) by mouth daily with breakfast. 30 tablet 3   Methylcobalamin (B-12) 5000 MCG TBDP Take 1 tablet by mouth daily.     metoprolol tartrate (LOPRESSOR) 25 MG tablet Take 0.5 tablets (12.5 mg total) by mouth 2 (two) times daily. 90 tablet 1   Multiple Vitamin (MULTIVITAMIN WITH MINERALS) TABS tablet Take 1 tablet by mouth 2 (two) times daily.     Probiotic Product (PROBIOTIC & ACIDOPHILUS EX ST PO) Take by mouth.     sildenafil (VIAGRA) 50 MG tablet Take 1 tablet (50 mg total) by mouth daily as needed for erectile dysfunction. 30 tablet 0   diclofenac (VOLTAREN) 75 MG EC tablet  Take 1 tablet (75 mg total) by mouth 2 (two) times daily. 30 tablet 0   Vitamin D, Ergocalciferol, (DRISDOL) 1.25 MG (50000 UNIT) CAPS capsule TAKE 1 CAPSULE EVERY 7 DAYS (Patient not taking: Reported on 09/10/2020) 12 capsule 3   No current facility-administered medications for this visit.    Allergies: Lisinopril-hydrochlorothiazide  Past Medical History:  Diagnosis Date   Anemia 10/28/2013   pt. stated "no" at preop appt. 08-29-14   Arthritis    both knees   Chicken pox as a child   Cutaneous skin tags 10/28/2013   Diabetes mellitus type 2 in obese (HCC) 01/27/2014   GERD (gastroesophageal reflux disease)    History of chicken pox    Hypertension    Low testosterone 10/28/2013   Peripheral artery disease (HCC) 11/03/2013   Preventative health care 10/26/2016   Sleep apnea    has c-pap machine   Tachycardia 05/15/2014    Past Surgical History:  Procedure Laterality Date   colonscopy     x 2   GASTRIC ROUX-EN-Y N/A 09/02/2014   Procedure: LAPAROSCOPIC ROUX-EN-Y GASTRIC BYPASS WITH UPPER ENDOSCOPY;  Surgeon: Gaynelle Adu, MD;  Location: [facility];  Service: General;  Laterality: N/A;    Family History  Problem Relation Age of Onset   Fibromyalgia Mother    Cancer Father        skin cancer   Cataracts Father  Arthritis Father    Diabetes Maternal Grandfather     Social History   Tobacco Use   Smoking status: Never   Smokeless tobacco: Never  Substance Use Topics   Alcohol use: No    Subjective:   Concerned for possible UTI; burning, urination x 3-4 days; no fever; no prior history of UTI or prostatitis;  Also requesting refill on Diclofenac;  Objective:  Vitals:   09/10/20 1558  BP: 136/70  Pulse: (!) 105  Temp: 99.8 F (37.7 C)  TempSrc: Oral  SpO2: 98%  Weight: (!) 337 lb 3.2 oz (153 kg)  Height: 6' (1.829 m)    General: Well developed, well nourished, in no acute distress  Skin : Warm and dry.  Head: Normocephalic and atraumatic  Lungs: Respirations  unlabored; clear to auscultation bilaterally without wheeze, rales, rhonchi  CVS exam: normal rate and regular rhythm.  Neurologic: Alert and oriented; speech intact; face symmetrical; moves all extremities well; CNII-XII intact without focal deficit   Assessment:  1. Dysuria   2. Urinary frequency   3. Osteoarthritis of both knees, unspecified osteoarthritis type     Plan:  & 2. Suspect prostatitis; check u/a and urine culture; Rx for Cipro 500 mg bid x 10 days; discussed Flomax but due to allergies am concerned for side effects; strict ER precautions discussed; Refill given on Diclofenac;  This visit occurred during the SARS-CoV-2 public health emergency.  Safety protocols were in place, including screening questions prior to the visit, additional usage of staff PPE, and extensive cleaning of exam room while observing appropriate contact time as indicated for disinfecting solutions.    No follow-ups on file.  Orders Placed This Encounter  Procedures   Urine Culture   POCT URINALYSIS DIP (CLINITEK)    Requested Prescriptions   Signed Prescriptions Disp Refills   diclofenac (VOLTAREN) 75 MG EC tablet 30 tablet 0    Sig: Take 1 tablet (75 mg total) by mouth 2 (two) times daily.   ciprofloxacin (CIPRO) 500 MG tablet 20 tablet 0    Sig: Take 1 tablet (500 mg total) by mouth 2 (two) times daily for 10 days.

## 2020-09-10 NOTE — Patient Instructions (Addendum)
Once you start feeling better, you can start take OTC Vitamin D3 2000 IU daily;     Prostatitis  Prostatitis is swelling or inflammation of the prostate gland, also called the prostate. This gland is about 1.5 inches wide and 1 inch high, and it is involved in making semen. The prostate is located below a man's bladder, infront of the rectum. There are four types of prostatitis: Chronic prostatitis (CP), also called chronic pelvic pain syndrome (CPPS). This is the most common type of prostatitis. It is associated with increased muscle tone in the area between the hip bones (pelvic area), around the prostate. This type is also known as a pelvic floor disorder. Chronic bacterial prostatitis. This type usually results from an acute bacterial infection in the prostate gland that keeps coming back or has not been treated properly. The symptoms are less severe than those caused by acute bacterial prostatitis, which lasts a shorter time. Asymptomatic inflammatory prostatitis. This type does not have symptoms and does not need treatment. This is diagnosed when tests are done for other disorders of the urinary tract or reproductive tract. Acute bacterial prostatitis. This type starts quickly and results from an acute bacterial infection in the prostate gland. It is usually associated with a bladder infection, high fever, and chills. This is the least common type of prostatitis. What are the causes? Bacterial prostatitis is caused by an infection from bacteria. Chronic nonbacterial prostatitis may be caused by: Factors related to the nervous system. This system includes thebrain, spinal cord, and nerves. An autoimmune response. This happens when the body's disease-fighting system attacks healthy tissue in the body by mistake. Psychological factors. These have to do with how the mind works. The causes of the other types of prostatitis are usually not known. What are the signs or symptoms? Symptoms of this  condition depend on the type of prostatitis you have. Acute bacterial prostatitis Symptoms may include: Pain or burning during urination. Frequent and sudden urges to urinate. Trouble starting to urinate. Fever. Chills. Pain in your muscles or joints, lower back, or lower abdomen. Other types of prostatitis Symptoms may include: Sudden urges to urinate, or urinating often. Trouble starting to urinate. Weak urine stream. Dribbling after urination. Discharge coming from the penis. Pain in the testicles, the penis, or the tip of the penis. Pain in the area in front of the rectum and below the scrotum (perineum). Pain when ejaculating. How is this diagnosed? This condition may be diagnosed based on: A physical and medical exam. A digital rectal exam. For this, the health care provider may use a finger to feel the prostate. A urine test to check for bacteria. A semen sample or blood tests. Ultrasound. Urodynamic tests to check how your body handles urine. Cystoscopy to look inside your bladder or inside the part of your body that drains urine from the bladder (urethra). How is this treated? Treatment for this condition depends on the type of prostatitis. Treatment may involve: Medicines to relieve pain or inflammation, or to help relax your muscles. Physical therapy. Heat therapy. Biofeedback. These techniques help you control certain body functions. Relaxation exercises. Antibiotic medicine, if your condition is caused by bacteria. Sitz baths. These warm water baths help to relax your pelvic floor muscles, which helps to relieve pressure on the prostate. Follow these instructions at home: Medicines Take over-the-counter and prescription medicines only as told by your health care provider. If you were prescribed an antibiotic medicine, take it as told by your health care  provider. Do not stop using the antibiotic even if you start to feel better. Managing pain and swelling  Take  sitz baths as directed by your health care provider. For a sitz bath, sit in warm water that is deep enough to cover your hips and buttocks. If directed, apply heat to the affected area as often as told by your health care provider. Use the heat source that your health care provider recommends, such as a moist heat pack or a heating pad. Place a towel between your skin and the heat source. Leave the heat on for 20-30 minutes. Remove the heat if your skin turns bright red. This is especially important if you are unable to feel pain, heat, or cold. You may have a greater risk of getting burned.  General instructions Do exercises as told by your health care provider, if you were prescribed physical therapy, biofeedback, or relaxation exercises. Keep all follow-up visits as told by your health care provider. This is important. Where to find more information [employer] of Diabetes and Digestive and Kidney Diseases: LowApproval[employer]se Contact a health care provider if: Your symptoms get worse. You have a fever. Get help right away if: You have chills. You feel light-headed or feel like you may faint. You cannot urinate. You have blood or blood clots in your urine. Summary Prostatitis is swelling or inflammation of the prostate gland. Treatment for this condition depends on the type of prostatitis. Take over-the-counter and prescription medicines only as told by your health care provider. Get help right away of you have chills, feel light-headed, feel like you may faint, cannot urinate, or have blood or blood clots in your urine. This information is not intended to replace advice given to you by your health care provider. Make sure you discuss any questions you have with your healthcare provider. Document Revised: 04/12/2019 Document Reviewed: 04/12/2019 Elsevier Patient Education  2022 [employer].

## 2020-09-12 LAB — URINE CULTURE
MICRO NUMBER:: 12042567
SPECIMEN QUALITY:: ADEQUATE

## 2020-09-15 NOTE — Progress Notes (Signed)
I have called pt and relayed the message. He stated understanding and feels much better.

## 2020-10-05 ENCOUNTER — Other Ambulatory Visit: Payer: Self-pay | Admitting: Family

## 2020-10-05 MED ORDER — DICLOFENAC SODIUM 75 MG PO TBEC: 75.0000 mg | DELAYED_RELEASE_TABLET | Freq: Two times a day (BID) | ORAL | 0 refills | Status: DC

## 2020-10-16 ENCOUNTER — Other Ambulatory Visit: Payer: Self-pay | Admitting: Family Medicine

## 2020-11-16 ENCOUNTER — Other Ambulatory Visit: Payer: Self-pay | Admitting: Family Medicine

## 2020-12-08 ENCOUNTER — Other Ambulatory Visit: Payer: Self-pay | Admitting: *Deleted

## 2020-12-08 MED ORDER — DICLOFENAC SODIUM 75 MG PO TBEC: 75.0000 mg | DELAYED_RELEASE_TABLET | Freq: Two times a day (BID) | ORAL | 0 refills | Status: DC

## 2020-12-25 DIAGNOSIS — M170 Bilateral primary osteoarthritis of knee: Secondary | ICD-10-CM | POA: Diagnosis not present

## 2021-01-18 ENCOUNTER — Other Ambulatory Visit: Payer: Self-pay | Admitting: Family Medicine

## 2021-01-18 MED ORDER — METOPROLOL TARTRATE 25 MG PO TABS: 12.5000 mg | ORAL_TABLET | Freq: Two times a day (BID) | ORAL | 0 refills | Status: DC

## 2021-03-31 DIAGNOSIS — M170 Bilateral primary osteoarthritis of knee: Secondary | ICD-10-CM | POA: Diagnosis not present

## 2021-04-23 ENCOUNTER — Other Ambulatory Visit: Payer: Self-pay | Admitting: Family Medicine

## 2021-05-03 ENCOUNTER — Other Ambulatory Visit: Payer: Self-pay

## 2021-05-03 ENCOUNTER — Encounter: Payer: Self-pay | Admitting: Family Medicine

## 2021-05-03 DIAGNOSIS — G4730 Sleep apnea, unspecified: Secondary | ICD-10-CM

## 2021-05-03 NOTE — Telephone Encounter (Signed)
Referral placed.

## 2021-05-04 DIAGNOSIS — B0052 Herpesviral keratitis: Secondary | ICD-10-CM | POA: Diagnosis not present

## 2021-05-07 DIAGNOSIS — B0052 Herpesviral keratitis: Secondary | ICD-10-CM | POA: Diagnosis not present

## 2021-06-07 ENCOUNTER — Other Ambulatory Visit: Payer: Self-pay

## 2021-06-07 ENCOUNTER — Encounter: Payer: Self-pay | Admitting: Family Medicine

## 2021-06-07 DIAGNOSIS — M25561 Pain in right knee: Secondary | ICD-10-CM

## 2021-06-07 MED ORDER — DICLOFENAC SODIUM 75 MG PO TBEC
75.0000 mg | DELAYED_RELEASE_TABLET | Freq: Two times a day (BID) | ORAL | 0 refills | Status: DC
Start: 2021-06-07 — End: 2021-12-30

## 2021-06-21 ENCOUNTER — Institutional Professional Consult (permissible substitution): Payer: BC Managed Care – PPO | Admitting: Primary Care

## 2021-07-08 ENCOUNTER — Encounter: Payer: Self-pay | Admitting: Primary Care

## 2021-07-08 ENCOUNTER — Ambulatory Visit (INDEPENDENT_AMBULATORY_CARE_PROVIDER_SITE_OTHER): Payer: BC Managed Care – PPO | Admitting: Primary Care

## 2021-07-08 VITALS — BP 148/88 | HR 65 | Temp 97.6°F | Ht 73.0 in | Wt 347.6 lb

## 2021-07-08 DIAGNOSIS — G4733 Obstructive sleep apnea (adult) (pediatric): Secondary | ICD-10-CM | POA: Diagnosis not present

## 2021-07-08 DIAGNOSIS — Z9989 Dependence on other enabling machines and devices: Secondary | ICD-10-CM

## 2021-07-08 NOTE — Progress Notes (Signed)
? ?@Patient  ID: , male    DOB: 1960-07-30, 61 y.o.   MRN: [id number] ? ?No chief complaint on file. ? ? ?Referring provider: ?[id number], MD ? ?HPI: ?61 year old male, never smoked.  Past medical history significant for hypertension, OSA on CPAP, type 2 diabetes, hyperlipidemia, morbid obesity s/p gastric bypass.  ? ?07/08/2021 ?Patient presents today for sleep consult. Patient has hx severe sleep apnea. Needs prescription for new CPAP machine d/t [facility] recall. He continues to use current CPAP machine and reports 100% compliance. There was no data on SD card. He has a 07/10/2021. [facility] will be sending replacement machine. He does not currently have a DME company, he orders his supplies online through [employer].  ? ?Sleep questionnaire ?Symptoms-  Current CPAP recalled, needs new prescription  ?Prior sleep study- 11/28/2004 NPSG>> AHI 122.5/hr, mean saturation 77% on room air / Weight 465lb  ?Bedtime- 10:30-11:30pm ?Time to fall asleep- 5 minutes  ?Nocturnal awakenings- 2-3 times  ?Out of bed/start of day- 6:25am ?Weight changes- Patient had gastric bypass down 100 lbs since sleep study, weight +15 lbs last 2 years  ?Do you operate heavy machinery- No ?Do you currently wear CPAP- Yes ?Do you current wear oxygen- No ?Epworth- 3 ? ?Allergies  ?Allergen Reactions  ? Lisinopril-Hydrochlorothiazide Swelling  ?  Lip swollen  ? ? ?Immunization History  ?Administered Date(s) Administered  ? Influenza-Unspecified 01/20/2014, 11/21/2014, 02/03/2020  ? PFIZER Comirnaty(Gray Top)Covid-19 Tri-Sucrose Vaccine 06/05/2019, 07/01/2019, 02/20/2020  ? Zoster Recombinat (Shingrix) 04/16/2020, 07/13/2020  ? ? ?Past Medical History:  ?Diagnosis Date  ? Anemia 10/28/2013  ? pt. stated "no" at preop appt. 08-29-14  ? Arthritis   ? both knees  ? Chicken pox as a child  ? Cutaneous skin tags 10/28/2013  ? Diabetes mellitus type 2 in obese (HCC) 01/27/2014  ? GERD (gastroesophageal reflux disease)   ? History of  chicken pox   ? Hypertension   ? Low testosterone 10/28/2013  ? Peripheral artery disease (HCC) 11/03/2013  ? Preventative health care 10/26/2016  ? Sleep apnea   ? has c-pap machine  ? Tachycardia 05/15/2014  ? ? ?Tobacco History: ?Social History  ? ?Tobacco Use  ?Smoking Status Never  ?Smokeless Tobacco Never  ? ?Counseling given: Not Answered ? ? ?Outpatient Medications Prior to Visit  ?Medication Sig Dispense Refill  ? Calcium Carb-Cholecalciferol (CALCIUM 600 + D PO) Take 1 tablet by mouth 3 (three) times daily.    ? diclofenac (VOLTAREN) 75 MG EC tablet Take 1 tablet (75 mg total) by mouth 2 (two) times daily. 180 tablet 0  ? metFORMIN (GLUCOPHAGE) 500 MG tablet Take 1 tablet (500 mg total) by mouth daily with breakfast. 30 tablet 3  ? Methylcobalamin (B-12) 5000 MCG TBDP Take 1 tablet by mouth daily.    ? metoprolol tartrate (LOPRESSOR) 25 MG tablet TAKE ONE-HALF (1/2) TABLET TWICE A DAY 90 tablet 3  ? Multiple Vitamin (MULTIVITAMIN WITH MINERALS) TABS tablet Take 1 tablet by mouth 2 (two) times daily.    ? Probiotic Product (PROBIOTIC & ACIDOPHILUS EX ST PO) Take by mouth.    ? sildenafil (VIAGRA) 50 MG tablet Take 1 tablet (50 mg total) by mouth daily as needed for erectile dysfunction. 30 tablet 0  ? Vitamin D, Ergocalciferol, (DRISDOL) 1.25 MG (50000 UNIT) CAPS capsule TAKE 1 CAPSULE EVERY 7 DAYS (Patient not taking: Reported on 09/10/2020) 12 capsule 3  ? ?No facility-administered medications prior to visit.  ? ?Review of Systems ? ?Review of  Systems  ?Constitutional: Negative.   ?HENT: Negative.    ?Respiratory: Negative.    ?Psychiatric/Behavioral: Negative.    ? ? ?Physical Exam ? ?BP (!) 148/88 (BP Location: Left Arm, Patient Position: Sitting, Cuff Size: Large)   Pulse 65   Temp 97.6 ?F (36.4 ?C) (Oral)   Ht 6\' 1"  (1.854 m)   Wt (!) 347 lb 9.6 oz (157.7 kg)   SpO2 96%   BMI 45.86 kg/m?  ?Physical Exam ?Constitutional:   ?   Appearance: Normal appearance. He is obese.  ?HENT:  ?   Head: Normocephalic  and atraumatic.  ?   Mouth/Throat:  ?   Mouth: Mucous membranes are moist.  ?Cardiovascular:  ?   Rate and Rhythm: Normal rate and regular rhythm.  ?Pulmonary:  ?   Effort: Pulmonary effort is normal.  ?   Breath sounds: Normal breath sounds.  ?Musculoskeletal:     ?   General: Normal range of motion.  ?Skin: ?   General: Skin is warm and dry.  ?Neurological:  ?   General: No focal deficit present.  ?   Mental Status: He is alert and oriented to person, place, and time. Mental status is at baseline.  ?Psychiatric:     ?   Mood and Affect: Mood normal.     ?   Behavior: Behavior normal.     ?   Thought Content: Thought content normal.     ?   Judgment: Judgment normal.  ?  ? ?Lab Results: ? ?CBC ?   ?Component Value Date/Time  ? WBC 5.7 07/21/2020 0752  ? RBC 4.46 07/21/2020 0752  ? HGB 13.1 07/21/2020 0752  ? HCT 39.7 07/21/2020 0752  ? PLT 306.0 07/21/2020 0752  ? MCV 88.9 07/21/2020 0752  ? MCV 93.5 05/26/2013 1040  ? MCH 28.8 09/04/2014 0420  ? MCHC 33.0 07/21/2020 0752  ? RDW 14.4 07/21/2020 0752  ? LYMPHSABS 1.3 07/21/2020 0752  ? MONOABS 0.5 07/21/2020 0752  ? EOSABS 0.4 07/21/2020 0752  ? BASOSABS 0.1 07/21/2020 0752  ? ? ?BMET ?   ?Component Value Date/Time  ? NA 140 07/21/2020 0752  ? K 4.0 07/21/2020 0752  ? CL 103 07/21/2020 0752  ? CO2 28 07/21/2020 0752  ? GLUCOSE 152 (H) 07/21/2020 0752  ? BUN 12 07/21/2020 0752  ? CREATININE 0.96 07/21/2020 0752  ? CREATININE 0.96 06/16/2016 1734  ? CALCIUM 8.9 07/21/2020 0752  ? GFRNONAA >60 09/04/2014 0420  ? GFRAA >60 09/04/2014 0420  ? ? ?BNP ?No results found for: BNP ? ?ProBNP ?No results found for: PROBNP ? ?Imaging: ?No results found. ? ? ?Assessment & Plan:  ? ?OSA on CPAP ?- Hx severe sleep apnea. NPSG in 2006 >> AHI 122.5/hr. [facility] CPAP machine recalled, needs prescription for new machine. Patient reports compliance with use. No data on SD card. Purchases supplies online, no local DME company. We will provide patient with prescription for new machine to  be provided through [facility]. Recommend auto CPAP setting 5-20cm h20. Needs follow-up in 1-3 months.  ? ? ?2007, NP ?07/08/2021 ? ?

## 2021-07-08 NOTE — Progress Notes (Signed)
Reviewed and agree with assessment/plan. ? ? ?Adrain Nesbit, MD ?[facility] ?07/08/2021, 11:37 AM ?Pager:  [phone] ? ?

## 2021-07-08 NOTE — Patient Instructions (Signed)
Orders: ?Prescription for new CPAP auto titrate 5-20cmh20 ? ?Follow-up: ?Once you have received new CPAP please call for 1-3 month follow-up with Waynetta Sandy NP  ?

## 2021-07-08 NOTE — Assessment & Plan Note (Signed)
-   Hx severe sleep apnea. NPSG in 2006 >> AHI 122.5/hr. [facility] CPAP machine recalled, needs prescription for new machine. Patient reports compliance with use. No data on SD card. Purchases supplies online, no local DME company. We will provide patient with prescription for new machine to be provided through [facility]. Recommend auto CPAP setting 5-20cm h20. Needs follow-up in 1-3 months.  ?

## 2021-08-11 ENCOUNTER — Ambulatory Visit (INDEPENDENT_AMBULATORY_CARE_PROVIDER_SITE_OTHER): Payer: BC Managed Care – PPO | Admitting: Primary Care

## 2021-08-11 VITALS — BP 120/70 | HR 67 | Temp 97.7°F | Ht 73.0 in | Wt 350.0 lb

## 2021-08-11 DIAGNOSIS — G4733 Obstructive sleep apnea (adult) (pediatric): Secondary | ICD-10-CM

## 2021-08-11 DIAGNOSIS — M170 Bilateral primary osteoarthritis of knee: Secondary | ICD-10-CM | POA: Diagnosis not present

## 2021-08-11 DIAGNOSIS — Z9989 Dependence on other enabling machines and devices: Secondary | ICD-10-CM

## 2021-08-11 NOTE — Assessment & Plan Note (Signed)
-   Hx severe sleep apnea. NPSG in 2006 >> AHI 122.5/hr. His [facility] machine was recalled. He received replacement [facility] machine in April/May 2023 from them but per [facility] this machine is so old that they can not service it or provide supplies. Recommend he get a new CPAP through [facility] with auto pressure setting 5-20cm h20. He gets his supplies online through [place]. FU in 3 months or sooner if needed.

## 2021-08-11 NOTE — Patient Instructions (Signed)
We spoke with [facility] they recommend you get a new machine with them. The [facility] machine is too old for them to service or provide parts. We would be unable to change pressure settings on this machine or provide you card for download. Unable to download compliance report from this machine. Your previous pressure setting was 17cm h20. We will set your pressure to auto titrate 5-20cm h20.  Orders: DME order new machine with [facility]   Follow-up: 3 months with Beth or sooner if needed

## 2021-08-11 NOTE — Progress Notes (Signed)
  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 I see in the notes he was a supply patient only and he got his pap unit from [place]. At that time he used a rem star pro pap unit.  Patient will need to bring current pap unit and card to one of our  retail locations for a download.   Thank you,   Luellen Pucker

## 2021-08-11 NOTE — Progress Notes (Signed)
@Patient  ID: [facility]ina, male    DOB: [birth year], 61 y.o.   MRN: [id number]  Chief Complaint  Patient presents with   Follow-up    He has tried the machine and he is not sure what the settings are and feels that its not giving a good enough pressure.     Referring provider: Bradd CanaryBlyth, Stacey A, MD  HPI: 61 year old male, never smoked.  Past medical history significant for hypertension, OSA on CPAP, type 2 diabetes, hyperlipidemia, morbid obesity s/p gastric bypass.   07/08/2021 Patient presents today for sleep consult. Patient has hx severe sleep apnea. Needs prescription for new CPAP machine d/t [facility] recall. He continues to use current CPAP machine and reports 100% compliance. There was no data on SD card. He has a [employer] [employer] machine. [facility] will be sending replacement machine. He does not currently have a DME company, he orders his supplies online through [employer].   Sleep questionnaire Symptoms-  Current CPAP recalled, needs new prescription  Prior sleep study- 11/28/2004 NPSG>> AHI 122.5/hr, mean saturation 77% on room air / Weight 465lb  Bedtime- 10:30-11:30pm Time to fall asleep- 5 minutes  Nocturnal awakenings- 2-3 times  Out of bed/start of day- 6:25am Weight changes- Patient had gastric bypass down 100 lbs since sleep study, weight +15 lbs last 2 years  Do you operate heavy machinery- No Do you currently wear CPAP- Yes Do you current wear oxygen- No Epworth- 3  08/11/2021- Interim hx Patient presents today for OSA follow-up/compliance check. He received replacement CPAP from [facility] since our last visit, we spoke with one of our local DME companies and they told us that despite him getting a replacement CPAP machine it is so old that they are not able to access download or provide replacement parts or SD card. They recommend he get a new CPAP machine with them. He gets his CPAP supplies through [employer]. Partial download from March 2-017 showed that he was using  pressure 17cm h20.    Allergies  Allergen Reactions   Lisinopril-Hydrochlorothiazide Swelling    Lip swollen    Immunization History  Administered Date(s) Administered   Influenza-Unspecified 01/20/2014, 11/21/2014, 02/03/2020   PFIZER Comirnaty(Gray Top)Covid-19 Tri-Sucrose Vaccine 06/05/2019, 07/01/2019, 02/20/2020   Zoster Recombinat (Shingrix) 04/16/2020, 07/13/2020    Past Medical History:  Diagnosis Date   Anemia 10/28/2013   pt. stated "no" at preop appt. 08-29-14   Arthritis    both knees   Chicken pox as a child   Cutaneous skin tags 10/28/2013   Diabetes mellitus type 2 in obese (HCC) 01/27/2014   GERD (gastroesophageal reflux disease)    History of chicken pox    Hypertension    Low testosterone 10/28/2013   Peripheral artery disease (HCC) 11/03/2013   Preventative health care 10/26/2016   Sleep apnea    has c-pap machine   Tachycardia 05/15/2014    Tobacco History: Social History   Tobacco Use  Smoking Status Never  Smokeless Tobacco Never   Counseling given: Not Answered   Outpatient Medications Prior to Visit  Medication Sig Dispense Refill   Calcium Carb-Cholecalciferol (CALCIUM 600 + D PO) Take 1 tablet by mouth 3 (three) times daily.     diclofenac (VOLTAREN) 75 MG EC tablet Take 1 tablet (75 mg total) by mouth 2 (two) times daily. 180 tablet 0   metFORMIN (GLUCOPHAGE) 500 MG tablet Take 1 tablet (500 mg total) by mouth daily with breakfast. 30 tablet 3   Methylcobalamin (B-12) 5000 MCG TBDP Take 1  tablet by mouth daily.     metoprolol tartrate (LOPRESSOR) 25 MG tablet TAKE ONE-HALF (1/2) TABLET TWICE A DAY 90 tablet 3   Multiple Vitamin (MULTIVITAMIN WITH MINERALS) TABS tablet Take 1 tablet by mouth 2 (two) times daily.     Probiotic Product (PROBIOTIC & ACIDOPHILUS EX ST PO) Take by mouth.     sildenafil (VIAGRA) 50 MG tablet Take 1 tablet (50 mg total) by mouth daily as needed for erectile dysfunction. 30 tablet 0   Vitamin D, Ergocalciferol,  (DRISDOL) 1.25 MG (50000 UNIT) CAPS capsule TAKE 1 CAPSULE EVERY 7 DAYS 12 capsule 3   No facility-administered medications prior to visit.   Review of Systems  Review of Systems  Constitutional: Negative.   HENT: Negative.    Respiratory: Negative.      Physical Exam  BP 120/70 (BP Location: Right Arm, Patient Position: Sitting, Cuff Size: Large)   Pulse 67   Temp 97.7 F (36.5 C) (Oral)   Ht 6\' 1"  (1.854 m)   Wt (!) 350 lb (158.8 kg)   SpO2 97%   BMI 46.18 kg/m  Physical Exam Constitutional:      Appearance: Normal appearance. He is not ill-appearing.  HENT:     Head: Normocephalic and atraumatic.     Mouth/Throat:     Mouth: Mucous membranes are moist.     Pharynx: Oropharynx is clear.  Cardiovascular:     Rate and Rhythm: Normal rate and regular rhythm.  Pulmonary:     Effort: Pulmonary effort is normal.     Breath sounds: Normal breath sounds.  Musculoskeletal:        General: Normal range of motion.  Skin:    General: Skin is warm and dry.  Neurological:     General: No focal deficit present.     Mental Status: He is alert and oriented to person, place, and time. Mental status is at baseline.  Psychiatric:        Mood and Affect: Mood normal.        Behavior: Behavior normal.        Thought Content: Thought content normal.        Judgment: Judgment normal.     Lab Results:  CBC    Component Value Date/Time   WBC 5.7 07/21/2020 0752   RBC 4.46 07/21/2020 0752   HGB 13.1 07/21/2020 0752   HCT 39.7 07/21/2020 0752   PLT 306.0 07/21/2020 0752   MCV 88.9 07/21/2020 0752   MCV 93.5 05/26/2013 1040   MCH 28.8 09/04/2014 0420   MCHC 33.0 07/21/2020 0752   RDW 14.4 07/21/2020 0752   LYMPHSABS 1.3 07/21/2020 0752   MONOABS 0.5 07/21/2020 0752   EOSABS 0.4 07/21/2020 0752   BASOSABS 0.1 07/21/2020 0752    BMET    Component Value Date/Time   NA 140 07/21/2020 0752   K 4.0 07/21/2020 0752   CL 103 07/21/2020 0752   CO2 28 07/21/2020 0752    GLUCOSE 152 (H) 07/21/2020 0752   BUN 12 07/21/2020 0752   CREATININE 0.96 07/21/2020 0752   CREATININE 0.96 06/16/2016 1734   CALCIUM 8.9 07/21/2020 0752   GFRNONAA >60 09/04/2014 0420   GFRAA >60 09/04/2014 0420    BNP No results found for: BNP  ProBNP No results found for: PROBNP  Imaging: No results found.   Assessment & Plan:   OSA on CPAP - Hx severe sleep apnea. NPSG in 2006 >> AHI 122.5/hr. His [facility] machine was recalled. He received  replacement [facility] machine in April/May 2023 from them but per [facility] this machine is so old that they can not service it or provide supplies. Recommend he get a new CPAP through [facility] with auto pressure setting 5-20cm h20. He gets his supplies online through [place]. FU in 3 months or sooner if needed.    Glenford Bayley, NP 08/11/2021

## 2021-09-03 DIAGNOSIS — G4733 Obstructive sleep apnea (adult) (pediatric): Secondary | ICD-10-CM | POA: Diagnosis not present

## 2021-10-03 DIAGNOSIS — G4733 Obstructive sleep apnea (adult) (pediatric): Secondary | ICD-10-CM | POA: Diagnosis not present

## 2021-11-03 DIAGNOSIS — G4733 Obstructive sleep apnea (adult) (pediatric): Secondary | ICD-10-CM | POA: Diagnosis not present

## 2021-11-11 ENCOUNTER — Ambulatory Visit: Payer: BC Managed Care – PPO | Admitting: Primary Care

## 2021-11-11 DIAGNOSIS — M170 Bilateral primary osteoarthritis of knee: Secondary | ICD-10-CM | POA: Diagnosis not present

## 2021-11-16 ENCOUNTER — Ambulatory Visit: Payer: BC Managed Care – PPO | Admitting: Primary Care

## 2021-11-16 ENCOUNTER — Encounter: Payer: Self-pay | Admitting: Primary Care

## 2021-11-16 DIAGNOSIS — G4733 Obstructive sleep apnea (adult) (pediatric): Secondary | ICD-10-CM

## 2021-11-16 DIAGNOSIS — Z9989 Dependence on other enabling machines and devices: Secondary | ICD-10-CM

## 2021-11-16 NOTE — Assessment & Plan Note (Addendum)
-   NPSG 11/28/2004 >> AHI 122.5/hr, mean saturation 77% on room air (Weight 465lb). Patient is 97% compliant with CPAP use last 30 days.  Received new machine since last visit in May 2023.  Pressure 5-20 cm H2O (12.4cm h20-95%) with residual AHI 1.4/hr. Recommend lowering max CPAP pressure to 15 cm H2O. Continue to encourage patient wear CPAP every night for minimum 4 to 6 hours.  Continue weight loss efforts.  Advised against driving experiencing excessive daytime sleepiness or fatigue.  Follow-up in 1 year or sooner if needed.

## 2021-11-16 NOTE — Patient Instructions (Addendum)
Sleep apnea is currently well controlled on auto CPAP I do recommend lowering max CPAP pressure as you are not requiring full amount  Recommendations:  Continue to wear CPAP every night for minimum 4 to 6 hours or longer Do not drive experiencing excessive daytime sleepiness or fatigue  Orders: Adjust CPAP pressure 5 to 15 cm H2O  Follow-up: 1 year with Beth NP or sooner if needed    CPAP and BIPAP Information CPAP and BIPAP are methods that use air pressure to keep your airways open and to help you breathe well. CPAP and BIPAP use different amounts of pressure. Your health care provider will tell you whether CPAP or BIPAP would be more helpful for you. CPAP stands for "continuous positive airway pressure." With CPAP, the amount of pressure stays the same while you breathe in (inhale) and out (exhale). BIPAP stands for "bi-level positive airway pressure." With BIPAP, the amount of pressure will be higher when you inhale and lower when you exhale. This allows you to take larger breaths. CPAP or BIPAP may be used in the hospital, or your health care provider may want you to use it at home. You may need to have a sleep study before your health care provider can order a machine for you to use at home. What are the advantages? CPAP or BIPAP can be helpful if you have: Sleep apnea. Chronic obstructive pulmonary disease (COPD). Heart failure. Medical conditions that cause muscle weakness, including muscular dystrophy or amyotrophic lateral sclerosis (ALS). Other problems that cause breathing to be shallow, weak, abnormal, or difficult. CPAP and BIPAP are most commonly used for obstructive sleep apnea (OSA) to keep the airways from collapsing when the muscles relax during sleep. What are the risks? Generally, this is a safe treatment. However, problems may occur, including: Irritated skin or skin sores if the mask does not fit properly. Dry or stuffy nose or nosebleeds. Dry mouth. Feeling  gassy or bloated. Sinus or lung infection if the equipment is not cleaned properly. When should CPAP or BIPAP be used? In most cases, the mask only needs to be worn during sleep. Generally, the mask needs to be worn throughout the night and during any daytime naps. People with certain medical conditions may also need to wear the mask at other times, such as when they are awake. Follow instructions from your health care provider about when to use the machine. What happens during CPAP or BIPAP?  Both CPAP and BIPAP are provided by a small machine with a flexible plastic tube that attaches to a plastic mask that you wear. Air is blown through the mask into your nose or mouth. The amount of pressure that is used to blow the air can be adjusted on the machine. Your health care provider will set the pressure setting and help you find the best mask for you. Tips for using the mask Because the mask needs to be snug, some people feel trapped or closed-in (claustrophobic) when first using the mask. If you feel this way, you may need to get used to the mask. One way to do this is to hold the mask loosely over your nose or mouth and then gradually apply the mask more snugly. You can also gradually increase the amount of time that you use the mask. Masks are available in various types and sizes. If your mask does not fit well, talk with your health care provider about getting a different one. Some common types of masks include: Full face  masks, which fit over the mouth and nose. Nasal masks, which fit over the nose. Nasal pillow or prong masks, which fit into the nostrils. If you are using a mask that fits over your nose and you tend to breathe through your mouth, a chin strap may be applied to help keep your mouth closed. Use a skin barrier to protect your skin as told by your health care provider. Some CPAP and BIPAP machines have alarms that may sound if the mask comes off or develops a leak. If you have  trouble with the mask, it is very important that you talk with your health care provider about finding a way to make the mask easier to tolerate. Do not stop using the mask. There could be a negative impact on your health if you stop using the mask. Tips for using the machine Place your CPAP or BIPAP machine on a secure table or stand near an electrical outlet. Know where the on/off switch is on the machine. Follow instructions from your health care provider about how to set the pressure on your machine and when you should use it. Do not eat or drink while the CPAP or BIPAP machine is on. Food or fluids could get pushed into your lungs by the pressure of the CPAP or BIPAP. For home use, CPAP and BIPAP machines can be rented or purchased through home health care companies. Many different brands of machines are available. Renting a machine before purchasing may help you find out which particular machine works well for you. Your health insurance company may also decide which machine you may get. Keep the CPAP or BIPAP machine and attachments clean. Ask your health care provider for specific instructions. Check the humidifier if you have a dry stuffy nose or nosebleeds. Make sure it is working correctly. Follow these instructions at home: Take over-the-counter and prescription medicines only as told by your health care provider. Ask if you can take sinus medicine if your sinuses are blocked. Do not use any products that contain nicotine or tobacco. These products include cigarettes, chewing tobacco, and vaping devices, such as e-cigarettes. If you need help quitting, ask your health care provider. Keep all follow-up visits. This is important. Contact a health care provider if: You have redness or pressure sores on your head, face, mouth, or nose from the mask or head gear. You have trouble using the CPAP or BIPAP machine. You cannot tolerate wearing the CPAP or BIPAP mask. Someone tells you that you  snore even when wearing your CPAP or BIPAP. Get help right away if: You have trouble breathing. You feel confused. Summary CPAP and BIPAP are methods that use air pressure to keep your airways open and to help you breathe well. If you have trouble with the mask, it is very important that you talk with your health care provider about finding a way to make the mask easier to tolerate. Do not stop using the mask. There could be a negative impact to your health if you stop using the mask. Follow instructions from your health care provider about when to use the machine. This information is not intended to replace advice given to you by your health care provider. Make sure you discuss any questions you have with your health care provider. Document Revised: 10/14/2020 Document Reviewed: 02/14/2020 Elsevier Patient Education  2023 [employer].

## 2021-11-16 NOTE — Progress Notes (Signed)
@Patient  ID: , male    DOB: 1960-12-11, 61 y.o.   MRN: [id number]  Chief Complaint  Patient presents with   Follow-up    Referring provider: [id number], MD  HPI: 61 year old male, never smoked.  Past medical history significant for hypertension, OSA on CPAP, type 2 diabetes, hyperlipidemia, morbid obesity s/p gastric bypass.   Previous [facility] pulmonary encounter: 07/08/2021 Patient presents today for sleep consult. Patient has hx severe sleep apnea. Needs prescription for new CPAP machine d/t [facility] recall. He continues to use current CPAP machine and reports 100% compliance. There was no data on SD card. He has a 07/10/2021. [facility] will be sending replacement machine. He does not currently have a DME company, he orders his supplies online through [facility].   Sleep questionnaire Symptoms-  Current CPAP recalled, needs new prescription  Prior sleep study- 11/28/2004 NPSG>> AHI 122.5/hr, mean saturation 77% on room air / Weight 465lb  Bedtime- 10:30-11:30pm Time to fall asleep- 5 minutes  Nocturnal awakenings- 2-3 times  Out of bed/start of day- 6:25am Weight changes- Patient had gastric bypass down 100 lbs since sleep study, weight +15 lbs last 2 years  Do you operate heavy machinery- No Do you currently wear CPAP- Yes Do you current wear oxygen- No [facility]- 3  08/11/2021 Patient presents today for OSA follow-up/compliance check. He received replacement CPAP from [facility] since our last visit, we spoke with one of our local DME companies and they told 08/13/2021 that despite him getting a replacement CPAP machine it is so old that they are not able to access download or provide replacement parts or SD card. They recommend he get a new CPAP machine with them. He gets his CPAP supplies through [facility]. Partial download from March 2017 showed that he was using pressure 17cm h20.   11/16/2021 - Interim hx Patient presents today for 3 month follow-up/OSA.  During  last visit in May patient was ordered for new CPAP machine with [facility].  Pressure setting was 17 cm H2O, recommended auto titrate 5 to 20 cm H2O. He is 97% compliant with CPAP use.  No issues with mask fit or pressure settings.  He received a new CPAP machine since our last visit and it is working well for him. He does report fragmented sleep due to nocturia.  He has no trouble falling back to sleep.  No residual daytime sleepiness.   [facility] download 10/16/2021 - 11/14/2021 29/30 days; 97% greater than 4 hours Average usage 6 hours 54 minutes Pressure 5 to 20 cm H2O (12.4 cm H2O-95%) Air leaks 10.3 L/min (95%) AHI 1.4  Allergies  Allergen Reactions   Lisinopril-Hydrochlorothiazide Swelling    Lip swollen    Immunization History  Administered Date(s) Administered   Influenza-Unspecified 01/20/2014, 11/21/2014, 02/03/2020   PFIZER Comirnaty(Gray Top)Covid-19 Tri-Sucrose Vaccine 06/05/2019, 07/01/2019, 02/20/2020   Zoster Recombinat (Shingrix) 04/16/2020, 07/13/2020    Past Medical History:  Diagnosis Date   Anemia 10/28/2013   pt. stated "no" at preop appt. 08-29-14   Arthritis    both knees   Chicken pox as a child   Cutaneous skin tags 10/28/2013   Diabetes mellitus type 2 in obese (HCC) 01/27/2014   GERD (gastroesophageal reflux disease)    History of chicken pox    Hypertension    Low testosterone 10/28/2013   Peripheral artery disease (HCC) 11/03/2013   Preventative health care 10/26/2016   Sleep apnea    has c-pap machine   Tachycardia 05/15/2014  Tobacco History: Social History   Tobacco Use  Smoking Status Never  Smokeless Tobacco Never   Counseling given: Not Answered   Outpatient Medications Prior to Visit  Medication Sig Dispense Refill   Calcium Carb-Cholecalciferol (CALCIUM 600 + D PO) Take 1 tablet by mouth 3 (three) times daily.     diclofenac (VOLTAREN) 75 MG EC tablet Take 1 tablet (75 mg total) by mouth 2 (two) times daily. 180 tablet 0   metFORMIN  (GLUCOPHAGE) 500 MG tablet Take 1 tablet (500 mg total) by mouth daily with breakfast. 30 tablet 3   Methylcobalamin (B-12) 5000 MCG TBDP Take 1 tablet by mouth daily.     metoprolol tartrate (LOPRESSOR) 25 MG tablet TAKE ONE-HALF (1/2) TABLET TWICE A DAY 90 tablet 3   Multiple Vitamin (MULTIVITAMIN WITH MINERALS) TABS tablet Take 1 tablet by mouth 2 (two) times daily.     Probiotic Product (PROBIOTIC & ACIDOPHILUS EX ST PO) Take by mouth.     sildenafil (VIAGRA) 50 MG tablet Take 1 tablet (50 mg total) by mouth daily as needed for erectile dysfunction. 30 tablet 0   Vitamin D, Ergocalciferol, (DRISDOL) 1.25 MG (50000 UNIT) CAPS capsule TAKE 1 CAPSULE EVERY 7 DAYS 12 capsule 3   No facility-administered medications prior to visit.    Review of Systems  Review of Systems  Constitutional: Negative.   HENT: Negative.    Respiratory: Negative.      Physical Exam  BP (!) 152/88 (BP Location: Right Arm, Patient Position: Sitting, Cuff Size: Large)   Pulse 67   Temp 98 F (36.7 C) (Oral)   Ht 6' (1.829 m)   Wt (!) 342 lb 6.4 oz (155.3 kg)   SpO2 98%   BMI 46.44 kg/m  Physical Exam Constitutional:      General: He is not in acute distress.    Appearance: Normal appearance. He is obese. He is not ill-appearing.  Cardiovascular:     Rate and Rhythm: Normal rate and regular rhythm.  Pulmonary:     Effort: Pulmonary effort is normal.     Breath sounds: Normal breath sounds.  Musculoskeletal:        General: Normal range of motion.  Skin:    General: Skin is warm and dry.  Neurological:     General: No focal deficit present.     Mental Status: He is alert and oriented to person, place, and time. Mental status is at baseline.  Psychiatric:        Mood and Affect: Mood normal.        Behavior: Behavior normal.        Thought Content: Thought content normal.        Judgment: Judgment normal.      Lab Results:  CBC    Component Value Date/Time   WBC 5.7 07/21/2020 0752    RBC 4.46 07/21/2020 0752   HGB 13.1 07/21/2020 0752   HCT 39.7 07/21/2020 0752   PLT 306.0 07/21/2020 0752   MCV 88.9 07/21/2020 0752   MCV 93.5 05/26/2013 1040   MCH 28.8 09/04/2014 0420   MCHC 33.0 07/21/2020 0752   RDW 14.4 07/21/2020 0752   LYMPHSABS 1.3 07/21/2020 0752   MONOABS 0.5 07/21/2020 0752   EOSABS 0.4 07/21/2020 0752   BASOSABS 0.1 07/21/2020 0752    BMET    Component Value Date/Time   NA 140 07/21/2020 0752   K 4.0 07/21/2020 0752   CL 103 07/21/2020 0752   CO2 28 07/21/2020 0752  GLUCOSE 152 (H) 07/21/2020 0752   BUN 12 07/21/2020 0752   CREATININE 0.96 07/21/2020 0752   CREATININE 0.96 06/16/2016 1734   CALCIUM 8.9 07/21/2020 0752   GFRNONAA >60 09/04/2014 0420   GFRAA >60 09/04/2014 0420    BNP No results found for: "BNP"  ProBNP No results found for: "PROBNP"  Imaging: No results found.   Assessment & Plan:   OSA on CPAP - NPSG 11/28/2004 >> AHI 122.5/hr, mean saturation 77% on room air (Weight 465lb). Patient is 97% compliant with CPAP use last 30 days.  Received new machine since last visit in May 2023.  Pressure 5-20 cm H2O (12.4cm h20-95%) with residual AHI 1.4/hr. Recommend lowering max CPAP pressure to 15 cm H2O. Continue to encourage patient wear CPAP every night for minimum 4 to 6 hours.  Continue weight loss efforts.  Advised against driving experiencing excessive daytime sleepiness or fatigue.  Follow-up in 1 year or sooner if needed.     Martyn Ehrich, NP 11/16/2021

## 2021-11-17 NOTE — Progress Notes (Signed)
Reviewed and agree with assessment/plan.   Coralyn Helling, MD [facility] Pulmonary/Critical Care 11/17/2021, 8:36 AM Pager:  [phone]

## 2021-11-22 DIAGNOSIS — J020 Streptococcal pharyngitis: Secondary | ICD-10-CM | POA: Diagnosis not present

## 2021-11-22 DIAGNOSIS — H9202 Otalgia, left ear: Secondary | ICD-10-CM | POA: Diagnosis not present

## 2021-12-04 DIAGNOSIS — G4733 Obstructive sleep apnea (adult) (pediatric): Secondary | ICD-10-CM | POA: Diagnosis not present

## 2021-12-30 ENCOUNTER — Other Ambulatory Visit: Payer: Self-pay | Admitting: Family Medicine

## 2021-12-30 DIAGNOSIS — M25562 Pain in left knee: Secondary | ICD-10-CM

## 2021-12-30 MED ORDER — DICLOFENAC SODIUM 75 MG PO TBEC: 75.0000 mg | DELAYED_RELEASE_TABLET | Freq: Two times a day (BID) | ORAL | 0 refills | Status: DC

## 2022-01-03 DIAGNOSIS — G4733 Obstructive sleep apnea (adult) (pediatric): Secondary | ICD-10-CM | POA: Diagnosis not present

## 2022-02-03 DIAGNOSIS — G4733 Obstructive sleep apnea (adult) (pediatric): Secondary | ICD-10-CM | POA: Diagnosis not present

## 2022-02-15 DIAGNOSIS — M170 Bilateral primary osteoarthritis of knee: Secondary | ICD-10-CM | POA: Diagnosis not present

## 2022-03-05 DIAGNOSIS — G4733 Obstructive sleep apnea (adult) (pediatric): Secondary | ICD-10-CM | POA: Diagnosis not present

## 2022-03-11 ENCOUNTER — Emergency Department (HOSPITAL_BASED_OUTPATIENT_CLINIC_OR_DEPARTMENT_OTHER): Payer: BC Managed Care – PPO

## 2022-03-11 ENCOUNTER — Other Ambulatory Visit: Payer: Self-pay

## 2022-03-11 ENCOUNTER — Inpatient Hospital Stay (HOSPITAL_BASED_OUTPATIENT_CLINIC_OR_DEPARTMENT_OTHER)
Admission: EM | Admit: 2022-03-11 | Discharge: 2022-03-14 | DRG: 175 | Disposition: A | Discharge status: Home / Self Care | Payer: BC Managed Care – PPO | Attending: Internal Medicine | Admitting: Internal Medicine

## 2022-03-11 ENCOUNTER — Encounter (HOSPITAL_BASED_OUTPATIENT_CLINIC_OR_DEPARTMENT_OTHER): Payer: Self-pay

## 2022-03-11 ENCOUNTER — Encounter (HOSPITAL_COMMUNITY): Payer: Self-pay

## 2022-03-11 DIAGNOSIS — M170 Bilateral primary osteoarthritis of knee: Secondary | ICD-10-CM | POA: Diagnosis not present

## 2022-03-11 DIAGNOSIS — E1151 Type 2 diabetes mellitus with diabetic peripheral angiopathy without gangrene: Secondary | ICD-10-CM | POA: Diagnosis present

## 2022-03-11 DIAGNOSIS — I82462 Acute embolism and thrombosis of left calf muscular vein: Secondary | ICD-10-CM | POA: Diagnosis present

## 2022-03-11 DIAGNOSIS — Z6841 Body Mass Index (BMI) 40.0 and over, adult: Secondary | ICD-10-CM

## 2022-03-11 DIAGNOSIS — G4733 Obstructive sleep apnea (adult) (pediatric): Secondary | ICD-10-CM

## 2022-03-11 DIAGNOSIS — I82412 Acute embolism and thrombosis of left femoral vein: Secondary | ICD-10-CM | POA: Diagnosis not present

## 2022-03-11 DIAGNOSIS — I2699 Other pulmonary embolism without acute cor pulmonale: Secondary | ICD-10-CM | POA: Diagnosis present

## 2022-03-11 DIAGNOSIS — Z8261 Family history of arthritis: Secondary | ICD-10-CM | POA: Diagnosis not present

## 2022-03-11 DIAGNOSIS — I2692 Saddle embolus of pulmonary artery without acute cor pulmonale: Secondary | ICD-10-CM | POA: Diagnosis present

## 2022-03-11 DIAGNOSIS — Z7984 Long term (current) use of oral hypoglycemic drugs: Secondary | ICD-10-CM

## 2022-03-11 DIAGNOSIS — R42 Dizziness and giddiness: Secondary | ICD-10-CM | POA: Diagnosis not present

## 2022-03-11 DIAGNOSIS — I10 Essential (primary) hypertension: Secondary | ICD-10-CM | POA: Diagnosis not present

## 2022-03-11 DIAGNOSIS — I2489 Other forms of acute ischemic heart disease: Secondary | ICD-10-CM | POA: Diagnosis present

## 2022-03-11 DIAGNOSIS — Z833 Family history of diabetes mellitus: Secondary | ICD-10-CM | POA: Diagnosis not present

## 2022-03-11 DIAGNOSIS — I2609 Other pulmonary embolism with acute cor pulmonale: Secondary | ICD-10-CM | POA: Diagnosis not present

## 2022-03-11 DIAGNOSIS — R0602 Shortness of breath: Secondary | ICD-10-CM | POA: Diagnosis not present

## 2022-03-11 DIAGNOSIS — Z9884 Bariatric surgery status: Secondary | ICD-10-CM | POA: Diagnosis not present

## 2022-03-11 DIAGNOSIS — E669 Obesity, unspecified: Secondary | ICD-10-CM | POA: Diagnosis present

## 2022-03-11 DIAGNOSIS — R55 Syncope and collapse: Secondary | ICD-10-CM | POA: Diagnosis not present

## 2022-03-11 DIAGNOSIS — K219 Gastro-esophageal reflux disease without esophagitis: Secondary | ICD-10-CM | POA: Diagnosis present

## 2022-03-11 DIAGNOSIS — E1169 Type 2 diabetes mellitus with other specified complication: Secondary | ICD-10-CM | POA: Diagnosis not present

## 2022-03-11 DIAGNOSIS — I2602 Saddle embolus of pulmonary artery with acute cor pulmonale: Secondary | ICD-10-CM | POA: Diagnosis not present

## 2022-03-11 DIAGNOSIS — Z79899 Other long term (current) drug therapy: Secondary | ICD-10-CM | POA: Diagnosis not present

## 2022-03-11 DIAGNOSIS — Z888 Allergy status to other drugs, medicaments and biological substances status: Secondary | ICD-10-CM

## 2022-03-11 DIAGNOSIS — Z808 Family history of malignant neoplasm of other organs or systems: Secondary | ICD-10-CM | POA: Diagnosis not present

## 2022-03-11 DIAGNOSIS — Z1152 Encounter for screening for COVID-19: Secondary | ICD-10-CM

## 2022-03-11 DIAGNOSIS — Z832 Family history of diseases of the blood and blood-forming organs and certain disorders involving the immune mechanism: Secondary | ICD-10-CM | POA: Diagnosis not present

## 2022-03-11 DIAGNOSIS — E119 Type 2 diabetes mellitus without complications: Secondary | ICD-10-CM | POA: Diagnosis present

## 2022-03-11 LAB — CBC WITH DIFFERENTIAL/PLATELET
Abs Immature Granulocytes: 0.03 10*3/uL (ref 0.00–0.07)
Basophils Absolute: 0.1 10*3/uL (ref 0.0–0.1)
Basophils Relative: 1 %
Eosinophils Absolute: 0.2 10*3/uL (ref 0.0–0.5)
Eosinophils Relative: 2 %
HCT: 43.8 % (ref 39.0–52.0)
Hemoglobin: 14 g/dL (ref 13.0–17.0)
Immature Granulocytes: 0 %
Lymphocytes Relative: 10 %
Lymphs Abs: 1 10*3/uL (ref 0.7–4.0)
MCH: 28.9 pg (ref 26.0–34.0)
MCHC: 32 g/dL (ref 30.0–36.0)
MCV: 90.5 fL (ref 80.0–100.0)
Monocytes Absolute: 0.9 10*3/uL (ref 0.1–1.0)
Monocytes Relative: 9 %
Neutro Abs: 7.9 10*3/uL — ABNORMAL HIGH (ref 1.7–7.7)
Neutrophils Relative %: 78 %
Platelets: 203 10*3/uL (ref 150–400)
RBC: 4.84 MIL/uL (ref 4.22–5.81)
RDW: 14 % (ref 11.5–15.5)
WBC: 10.1 10*3/uL (ref 4.0–10.5)
nRBC: 0 % (ref 0.0–0.2)

## 2022-03-11 LAB — COMPREHENSIVE METABOLIC PANEL
ALT: 63 U/L — ABNORMAL HIGH (ref 0–44)
AST: 63 U/L — ABNORMAL HIGH (ref 15–41)
Albumin: 3.6 g/dL (ref 3.5–5.0)
Alkaline Phosphatase: 66 U/L (ref 38–126)
Anion gap: 7 (ref 5–15)
BUN: 16 mg/dL (ref 6–20)
CO2: 26 mmol/L (ref 22–32)
Calcium: 8.4 mg/dL — ABNORMAL LOW (ref 8.9–10.3)
Chloride: 106 mmol/L (ref 98–111)
Creatinine, Ser: 1.18 mg/dL (ref 0.61–1.24)
GFR, Estimated: >60 mL/min (ref >60)
Glucose, Bld: 126 mg/dL — ABNORMAL HIGH (ref 70–99)
Potassium: 3.6 mmol/L (ref 3.5–5.1)
Sodium: 139 mmol/L (ref 135–145)
Total Bilirubin: 0.6 mg/dL (ref 0.3–1.2)
Total Protein: 7.5 g/dL (ref 6.5–8.1)

## 2022-03-11 LAB — RESP PANEL BY RT-PCR (RSV, FLU A&B, COVID)  RVPGX2
Influenza A by PCR: NEGATIVE
Influenza B by PCR: NEGATIVE
Resp Syncytial Virus by PCR: NEGATIVE
SARS Coronavirus 2 by RT PCR: NEGATIVE

## 2022-03-11 LAB — CBG MONITORING, ED: Glucose-Capillary: 110 mg/dL — ABNORMAL HIGH (ref 70–99)

## 2022-03-11 LAB — LIPASE, BLOOD: Lipase: 48 U/L (ref 11–51)

## 2022-03-11 LAB — TROPONIN I (HIGH SENSITIVITY)
Troponin I (High Sensitivity): 134 ng/L (ref <18)
Troponin I (High Sensitivity): 211 ng/L (ref <18)

## 2022-03-11 LAB — BRAIN NATRIURETIC PEPTIDE: B Natriuretic Peptide: 27 pg/mL (ref 0.0–100.0)

## 2022-03-11 MED ORDER — HEPARIN (PORCINE) 25000 UT/250ML-% IV SOLN
1600.0000 [IU]/h | INTRAVENOUS | Status: DC
  Administered 2022-03-11: 1200 [IU]/h via INTRAVENOUS
  Administered 2022-03-12: 1600 [IU]/h via INTRAVENOUS
  Filled 2022-03-11 (×2): qty 250

## 2022-03-11 MED ORDER — IOHEXOL 350 MG/ML SOLN
80.0000 mL | Freq: Once | INTRAVENOUS | Status: AC | PRN
  Administered 2022-03-11: 80 mL via INTRAVENOUS

## 2022-03-11 MED ORDER — INSULIN ASPART 100 UNIT/ML IJ SOLN
0.0000 [IU] | Freq: Three times a day (TID) | INTRAMUSCULAR | Status: DC
  Administered 2022-03-12: 2 [IU] via SUBCUTANEOUS

## 2022-03-11 MED ORDER — HEPARIN BOLUS VIA INFUSION
4000.0000 [IU] | Freq: Once | INTRAVENOUS | Status: AC
  Administered 2022-03-11: 4000 [IU] via INTRAVENOUS

## 2022-03-11 MED ORDER — LACTATED RINGERS IV BOLUS
1000.0000 mL | Freq: Once | INTRAVENOUS | Status: AC
  Administered 2022-03-11: 1000 mL via INTRAVENOUS

## 2022-03-11 MED ORDER — ONDANSETRON HCL 4 MG/2ML IJ SOLN: 4.0000 mg | Freq: Four times a day (QID) | INTRAMUSCULAR | Status: DC | PRN

## 2022-03-11 MED ORDER — INSULIN ASPART 100 UNIT/ML IJ SOLN: 0.0000 [IU] | Freq: Every day | INTRAMUSCULAR | Status: DC

## 2022-03-11 MED ORDER — ASPIRIN 81 MG PO CHEW
324.0000 mg | CHEWABLE_TABLET | Freq: Once | ORAL | Status: AC
  Administered 2022-03-11: 324 mg via ORAL
  Filled 2022-03-11: qty 4

## 2022-03-11 MED ORDER — ACETAMINOPHEN 325 MG PO TABS: 650.0000 mg | ORAL_TABLET | Freq: Four times a day (QID) | ORAL | Status: DC | PRN

## 2022-03-11 MED ORDER — ONDANSETRON HCL 4 MG PO TABS: 4.0000 mg | ORAL_TABLET | Freq: Four times a day (QID) | ORAL | Status: DC | PRN

## 2022-03-11 MED ORDER — ACETAMINOPHEN 650 MG RE SUPP: 650.0000 mg | Freq: Four times a day (QID) | RECTAL | Status: DC | PRN

## 2022-03-11 MED ORDER — ADULT MULTIVITAMIN W/MINERALS CH
1.0000 | ORAL_TABLET | Freq: Two times a day (BID) | ORAL | Status: DC
  Administered 2022-03-11 – 2022-03-14 (×3): 1 via ORAL
  Filled 2022-03-11 (×6): qty 1

## 2022-03-11 NOTE — Progress Notes (Signed)
ANTICOAGULATION CONSULT NOTE - Initial Consult  Pharmacy Consult for heparin  Indication: chest pain/ACS  Allergies  Allergen Reactions   Lisinopril-Hydrochlorothiazide Swelling    Lip swollen    Patient Measurements: Height: 6' (182.9 cm) Weight: (!) 154.2 kg (340 lb) IBW/kg (Calculated) : 77.6 Heparin Dosing Weight: 114.2  Vital Signs: Temp: 97.5 F (36.4 C) (12/22 1426) Temp Source: Oral (12/22 1426) BP: 134/92 (12/22 1620) Pulse Rate: 93 (12/22 1620)  Labs: Recent Labs    03/11/22 1616  HGB 14.0  HCT 43.8  PLT 203  CREATININE 1.18  TROPONINIHS 134*    Estimated Creatinine Clearance: 103.2 mL/min (by C-G formula based on SCr of 1.18 mg/dL).   Medical History: Past Medical History:  Diagnosis Date   Anemia 10/28/2013   pt. stated "no" at preop appt. 08-29-14   Arthritis    both knees   Chicken pox as a child   Cutaneous skin tags 10/28/2013   Diabetes mellitus type 2 in obese (HCC) 01/27/2014   GERD (gastroesophageal reflux disease)    History of chicken pox    Hypertension    Low testosterone 10/28/2013   Peripheral artery disease (HCC) 11/03/2013   Preventative health care 10/26/2016   Sleep apnea    has c-pap machine   Tachycardia 05/15/2014    Assessment: Patient admitted for SOB, trop elevated to 134. Patient not on anticoagulation PTA.   Goal of Therapy:  Heparin level 0.3-0.7 units/ml Monitor platelets by anticoagulation protocol: Yes   Plan:  Give 4000 units bolus x 1 Start heparin infusion at 1200 units/hr Check anti-Xa level in 6 hours and daily while on heparin Continue to monitor H&H and platelets  Francena Hanly 03/11/2022,5:39 PM

## 2022-03-11 NOTE — ED Provider Notes (Signed)
[facility] EMERGENCY DEPARTMENT Provider Note   CSN: [id number] Arrival date & time: 03/11/22  1400     History Chief Complaint  Patient presents with   Shortness of Breath    HPI Jeffrey Molina is a 61 y.o. male presenting for chief complaint of shortness of breath.  He is an otherwise healthy 61 year old male with history of hypertension otherwise compliant on therapy coming in with a chief complaint of sudden onset dyspnea on exertion earlier today.  He denies fevers or chills, nausea vomiting, syncope or shortness of breath.  No known sick contacts.  Is otherwise ambulatory tolerating p.o. intake.  He states that he was at his desk today when he became acutely dyspneic and had a near syncopal event.  He states it was a little warm in the office but not substantially so.  He tried to walk to the bathroom and had worsening dyspnea.  He states that he has had persistent dyspnea over the last 4 hours since his symptoms started.  No history of similar.  Denies long travel.  Denies any heart history denies any family heart history or blood clotting history.  He is comfortable at rest but when he stands up he becomes very dyspneic.   Patient's recorded medical, surgical, social, medication list and allergies were reviewed in the Snapshot window as part of the initial history.   Review of Systems   Review of Systems  Constitutional:  Positive for fatigue. Negative for chills and fever.  HENT:  Negative for ear pain and sore throat.   Eyes:  Negative for pain and visual disturbance.  Respiratory:  Positive for shortness of breath. Negative for cough.   Cardiovascular:  Negative for chest pain and palpitations.  Gastrointestinal:  Negative for abdominal pain and vomiting.  Genitourinary:  Negative for dysuria and hematuria.  Musculoskeletal:  Negative for arthralgias and back pain.  Skin:  Negative for color change and rash.  Neurological:  Negative for seizures and syncope.   All other systems reviewed and are negative.   Physical Exam Updated Vital Signs BP 130/89   Pulse 67   Temp 98.6 F (37 C) (Oral)   Resp 12   Ht 6' (1.829 m)   Wt (!) 154.2 kg   SpO2 94%   BMI 46.11 kg/m  Physical Exam Vitals and nursing note reviewed.  Constitutional:      General: He is not in acute distress.    Appearance: He is well-developed.  HENT:     Head: Normocephalic and atraumatic.  Eyes:     Conjunctiva/sclera: Conjunctivae normal.  Cardiovascular:     Rate and Rhythm: Normal rate and regular rhythm.     Heart sounds: No murmur heard. Pulmonary:     Effort: Pulmonary effort is normal. No respiratory distress.     Breath sounds: Normal breath sounds.  Abdominal:     Palpations: Abdomen is soft.     Tenderness: There is no abdominal tenderness.  Musculoskeletal:        General: No swelling.     Cervical back: Neck supple.  Skin:    General: Skin is warm and dry.     Capillary Refill: Capillary refill takes less than 2 seconds.  Neurological:     Mental Status: He is alert.  Psychiatric:        Mood and Affect: Mood normal.      ED Course/ Medical Decision Making/ A&P Clinical Course as of 03/11/22 2303  Fri Mar 11, 2022  1845 Pulmnology  [CC]  2137 Patient transfer from outside facility after being diagnosed with pulmonary embolism in the setting of new onset dyspnea and near syncope.  Patient had saddle PE.  He is started on heparin.  I evaluated the patient and he has no complaints.  Currently hemodynamically stable.  I discussed with pulmonology, they feel that since the patient has a low PESI  score, and is hemodynamically stable, it is unlikely that interventional radiology will pursue intervention at this time.  They will evaluate the patient.  They recommend admission to the stepdown unit.  Will page the hospitalist. [WS]    Clinical Course User Index [CC] Glyn Ade, MD [WS] Lonell Grandchild, MD    Procedures .Critical  Care  Performed by: Glyn Ade, MD Authorized by: Glyn Ade, MD   Critical care provider statement:    Critical care time (minutes):  30   Critical care was necessary to treat or prevent imminent or life-threatening deterioration of the following conditions:  Respiratory failure   Critical care was time spent personally by me on the following activities:  Development of treatment plan with patient or surrogate, discussions with consultants, evaluation of patient's response to treatment, examination of patient, ordering and review of laboratory studies, ordering and review of radiographic studies, ordering and performing treatments and interventions, pulse oximetry, re-evaluation of patient's condition and review of old charts    Medications Ordered in ED Medications  heparin ADULT infusion 100 units/mL (25000 units/288mL) (1,200 Units/hr Intravenous New Bag/Given 03/11/22 1750)  acetaminophen (TYLENOL) tablet 650 mg (has no administration in time range)    Or  acetaminophen (TYLENOL) suppository 650 mg (has no administration in time range)  ondansetron (ZOFRAN) tablet 4 mg (has no administration in time range)    Or  ondansetron (ZOFRAN) injection 4 mg (has no administration in time range)  insulin aspart (novoLOG) injection 0-15 Units (has no administration in time range)  insulin aspart (novoLOG) injection 0-5 Units ( Subcutaneous Not Given 03/11/22 2235)  multivitamin with minerals tablet 1 tablet (1 tablet Oral Given 03/11/22 2236)  lactated ringers bolus 1,000 mL (0 mLs Intravenous Stopped 03/11/22 1916)  iohexol (OMNIPAQUE) 350 MG/ML injection 80 mL (80 mLs Intravenous Contrast Given 03/11/22 1722)  aspirin chewable tablet 324 mg (324 mg Oral Given 03/11/22 1739)  heparin bolus via infusion 4,000 Units (4,000 Units Intravenous Bolus from Bag 03/11/22 1749)   Medical Decision Making:   Jeffrey Molina is a 61 y.o. male who presented to the ED today with a near syncope  episode detailed above.    Patient's presentation is complicated by their history of multiple comorbid medical problems.  Patient placed on continuous vitals and telemetry monitoring while in ED which was reviewed periodically.  Complete initial physical exam performed, notably the patient  was hemodynamically stable no acute distress.  Normal pulses bilaterally.  Tachycardic in the bed at 102.    Reviewed and confirmed nursing documentation for past medical history, family history, social history.    Initial Assessment:   With the patient's presentation of syncope, most likely diagnosis is orthostatic hypotension vs vasovagal episode. Other diagnoses were considered including (but not limited to) arrythmogenic syncope, valvular abnormality, PE, aortic dissection. These are considered less likely due to history of present illness and physical exam findings.   This is most consistent with an acute life/limb threatening illness complicated by underlying chronic conditions. In particular, concerning cardiac etiology, this is less likely to be the etiology given the lack of  chest pain, lack of serious comorbidities including heart failure orCAD.   Additionally, patient's history appears more consistent with benign episodes including orthostatic remains vagal episode based on clinical description of the event.  FAINT score can be utilized in syncope to predict cardiac event in the appropriate patient. Utilization of BNP/Troponin testing can predict cardiac events within 30 days.   This patient is a candidate for a faint score as she has a baseline low pretest probability for cardiac etiology. Will proceed with BNP/Troponin testing as below. If negative, then patient without risk factor for acute cardiac event causing her syncope.    Initial Plan:  Pulmonary embolism also remains on the differential given his description of persistent dyspnea.  CTA PE study due to high probability ordered Screening labs  including CBC and Metabolic panel to evaluate for infectious or metabolic etiology of disease.  Urinalysis with reflex culture ordered to evaluate for UTI or relevant urologic/nephrologic pathology.  CXR to evaluate for structural/infectious intrathoracic pathology.  Troponin/BNP/EKG to evaluate for cardiac pathology. Utilization of FAINT scoring detailed above.  Objective evaluation as below reviewed after administration of IVF/Telemetry monitoring  Initial Study Results:   Troponin resulted critically elevated  EKG EKG was reviewed independently. Rate, rhythm, axis, intervals all examined and without medically relevant abnormality. ST segments without concerns for elevations.    Radiology:  All images reviewed independently. Agree with radiology report at this time.   CT Angio Chest Pulmonary Embolism (PE) W or WO Contrast  Result Date: 03/11/2022 CLINICAL DATA:  Pulmonary embolism (PE) suspected, high prob Pt presents with reports of lightheadedness and near syncope that started today while at work. EXAM: CT ANGIOGRAPHY CHEST WITH CONTRAST TECHNIQUE: Multidetector CT imaging of the chest was performed using the standard protocol during bolus administration of intravenous contrast. Multiplanar CT image reconstructions and MIPs were obtained to evaluate the vascular anatomy. RADIATION DOSE REDUCTION: This exam was performed according to the departmental dose-optimization program which includes automated exposure control, adjustment of the mA and/or kV according to patient size and/or use of iterative reconstruction technique. CONTRAST:  80mL OMNIPAQUE IOHEXOL 350 MG/ML SOLN COMPARISON:  Chest XR, 03/11/2022 FINDINGS: Cardiovascular: *Satisfactory opacification of the pulmonary arteries to the segmental level. *Saddle pulmonary embolus with additional emboli extending into the inferior and superior lobar arteries. *CT evidence of RIGHT heart strain, with RV/LV ratio of 1.4. Additional leftward  septal buckling. See key image. *Normal heart size. No pericardial effusion. Mediastinum/Nodes: No enlarged mediastinal, hilar, or axillary lymph nodes. Thyroid gland, trachea, and esophagus demonstrate no significant findings. Lungs/Pleura: Lungs are clear without focal consolidation, mass or suspicious pulmonary nodule. No pleural effusion or pneumothorax. Upper Abdomen: No acute abnormality. Postsurgical changes of Roux-en-Y gastric bypass. Musculoskeletal: No acute chest wall abnormality. No significant osseous findings. Review of the MIP images confirms the above findings. IMPRESSION: Examination is POSITIVE for acute saddle-type pulmonary emboli, with CT evidence of right heart strain (RV/LV Ratio = 1.4). This is consistent with at least submassive (intermediate risk) PE. The presence of right heart strain has been associated with an increased risk of morbidity and mortality. Please refer to the "Code PE Focused" order set in EPIC. These results will be called to the ordering clinician or representative by the Radiologist Assistant, and communication documented in the PACS or [employer] Dashboard. Electronically Signed   By: Roanna BanningJon  Mugweru M.D.   On: 03/11/2022 18:00   DG Chest 2 View  Result Date: 03/11/2022 CLINICAL DATA:  Shortness of breath. EXAM: CHEST - 2  VIEW COMPARISON:  February 19, 2015. FINDINGS: The heart size and mediastinal contours are within normal limits. Both lungs are clear. The visualized skeletal structures are unremarkable. IMPRESSION: No active cardiopulmonary disease. Electronically Signed   By: Lupita Raider M.D.   On: 03/11/2022 14:54      Consults: Case discussed with pulmonology Dr. Francine Graven and Dr. Wynona Neat.   Final Assessment and Plan:   Patient may be a candidate for catheter directed therapy.  Critical care would like to have the patient transferred emergently to their location so that he can be evaluated for intervention.  Discussed with ED provider Dr. Suezanne Jacquet who agreed  with transfer. Disposition:   Based on the above findings, I believe this patient is stable for admission.    Patient/family educated about specific findings on our evaluation and explained exact reasons for admission.  Patient/family educated about clinical situation and time was allowed to answer questions.   Admission team communicated with and agreed with need for admission. Patient admitted. Patient  ready to move at this time.     Emergency Department Medication Summary:   Medications  heparin ADULT infusion 100 units/mL (25000 units/234mL) (1,200 Units/hr Intravenous New Bag/Given 03/11/22 1750)  acetaminophen (TYLENOL) tablet 650 mg (has no administration in time range)    Or  acetaminophen (TYLENOL) suppository 650 mg (has no administration in time range)  ondansetron (ZOFRAN) tablet 4 mg (has no administration in time range)    Or  ondansetron (ZOFRAN) injection 4 mg (has no administration in time range)  insulin aspart (novoLOG) injection 0-15 Units (has no administration in time range)  insulin aspart (novoLOG) injection 0-5 Units ( Subcutaneous Not Given 03/11/22 2235)  multivitamin with minerals tablet 1 tablet (1 tablet Oral Given 03/11/22 2236)  lactated ringers bolus 1,000 mL (0 mLs Intravenous Stopped 03/11/22 1916)  iohexol (OMNIPAQUE) 350 MG/ML injection 80 mL (80 mLs Intravenous Contrast Given 03/11/22 1722)  aspirin chewable tablet 324 mg (324 mg Oral Given 03/11/22 1739)  heparin bolus via infusion 4,000 Units (4,000 Units Intravenous Bolus from Bag 03/11/22 1749)        Clinical Impression:  1. Acute saddle pulmonary embolism with acute cor pulmonale (HCC)      Admit   Final Clinical Impression(s) / ED Diagnoses Final diagnoses:  Acute saddle pulmonary embolism with acute cor pulmonale (HCC)    Rx / DC Orders ED Discharge Orders     None         Glyn Ade, MD 03/11/22 2303

## 2022-03-11 NOTE — ED Triage Notes (Signed)
Pt presents with reports of lightheadedness and near syncope that started today while at work. After that episode pt noted that he had some ShOB with exertion. Pt's wife states pt has been fatigued this week. Pt denies CP.

## 2022-03-11 NOTE — ED Notes (Signed)
Pt walking back from Radiology to waiting room. Ambulating SpO2 97% on room air, HR 132, RR 26. Pt endorses Shortness of breath. RT will continue to be available as needed.

## 2022-03-11 NOTE — Assessment & Plan Note (Addendum)
Submassive PE with RHS, trop leak - TTE, US doppler BLE ordered - heparin gtt - consideration of IR involvement for catheter directed lytics should he deteriorate - peripheral lytics emergently if develops massive PE physiology - bedrest tonight - CPAP tonight

## 2022-03-11 NOTE — Assessment & Plan Note (Signed)
Hold metoprolol.

## 2022-03-11 NOTE — Assessment & Plan Note (Addendum)
With DM2, consider if candidate (will insurance company cover) for [employer] or ozempic as outpt.

## 2022-03-11 NOTE — H&P (Signed)
History and Physical    Patient: Jeffrey Molina FMB:[id number] DOB: Jan 31, 1961 DOA: 03/11/2022 DOS: the patient was seen and examined on 03/11/2022 PCP: Bradd Canary, MD  Patient coming from: Home  Chief Complaint:  Chief Complaint  Patient presents with   Shortness of Breath   HPI: Jeffrey Molina is a 61 y.o. male with medical history significant of HTN, DM2, obesity, OSA.  Pt with sudden onset of DOE earlier today while sitting at desk at work. Had near syncope.  No fevers, chills, N/V.  Dyspnea persistent over past 4 hours since onset.  No recent long distance travel, no h/o heart disease, no fhx of heart dz or blood clotting disorders.  Comfortable at rest, becomes very dyspneic with standing.   Review of Systems: As mentioned in the history of present illness. All other systems reviewed and are negative. Past Medical History:  Diagnosis Date   Anemia 10/28/2013   pt. stated "no" at preop appt. 08-29-14   Arthritis    both knees   Chicken pox as a child   Cutaneous skin tags 10/28/2013   Diabetes mellitus type 2 in obese (HCC) 01/27/2014   GERD (gastroesophageal reflux disease)    History of chicken pox    Hypertension    Low testosterone 10/28/2013   Peripheral artery disease (HCC) 11/03/2013   Preventative health care 10/26/2016   Sleep apnea    has c-pap machine   Tachycardia 05/15/2014   Past Surgical History:  Procedure Laterality Date   colonscopy     x 2   GASTRIC ROUX-EN-Y N/A 09/02/2014   Procedure: LAPAROSCOPIC ROUX-EN-Y GASTRIC BYPASS WITH UPPER ENDOSCOPY;  Surgeon: Gaynelle Adu, MD;  Location: [facility];  Service: General;  Laterality: N/A;   Social History:  reports that he has never smoked. He has never used smokeless tobacco. He reports that he does not drink alcohol and does not use drugs.  Allergies  Allergen Reactions   Lisinopril-Hydrochlorothiazide Swelling    Lip swollen    Family History  Problem Relation Age of Onset   Fibromyalgia Mother     Cancer Father        skin cancer   Cataracts Father    Arthritis Father    Diabetes Maternal Grandfather     Prior to Admission medications   Medication Sig Start Date End Date Taking? Authorizing Provider  Calcium Carb-Cholecalciferol (CALCIUM 600 + D PO) Take 1 tablet by mouth daily.   Yes [provider]  diclofenac (VOLTAREN) 75 MG EC tablet Take 1 tablet (75 mg total) by mouth 2 (two) times daily. 12/30/21  Yes Bradd Canary, MD  metFORMIN (GLUCOPHAGE) 500 MG tablet Take 1 tablet (500 mg total) by mouth daily with breakfast. 07/27/20  Yes Saguier, Ramon Dredge, PA-C  Methylcobalamin (B-12) 5000 MCG TBDP Take 1 tablet by mouth daily.   Yes [provider]  metoprolol tartrate (LOPRESSOR) 25 MG tablet TAKE ONE-HALF (1/2) TABLET TWICE A DAY Patient taking differently: Take 12.5 mg by mouth daily. 04/23/21  Yes Bradd Canary, MD  Multiple Vitamin (MULTIVITAMIN WITH MINERALS) TABS tablet Take 1 tablet by mouth 2 (two) times daily.   Yes [provider]  Probiotic Product (PROBIOTIC & ACIDOPHILUS EX ST PO) Take 1 tablet by mouth daily.   Yes [provider]  sildenafil (VIAGRA) 50 MG tablet Take 1 tablet (50 mg total) by mouth daily as needed for erectile dysfunction. 07/13/20  Yes Saguier, Ramon Dredge, PA-C  Vitamin D, Ergocalciferol, (DRISDOL) 1.25 MG (50000 UNIT)  CAPS capsule TAKE 1 CAPSULE EVERY 7 DAYS Patient not taking: Reported on 03/11/2022 05/10/19   Bradd Canary, MD    Physical Exam: Vitals:   03/11/22 1900 03/11/22 1934 03/11/22 1958 03/11/22 2020  BP: (!) 152/84  (!) 152/82 (!) 140/105  Pulse: 92  88 93  Resp:   14 14  Temp: 97.8 F (36.6 C) 98.6 F (37 C)    TempSrc: Oral Oral    SpO2: 99%  99% 98%  Weight:      Height:       Constitutional: NAD, calm, comfortable Eyes: PERRL, lids and conjunctivae normal ENMT: Mucous membranes are moist. Posterior pharynx clear of any exudate or lesions.Normal dentition.  Neck: normal, supple, no  masses, no thyromegaly Respiratory: clear to auscultation bilaterally, no wheezing, no crackles. Normal respiratory effort. No accessory muscle use.  Cardiovascular: Regular rate and rhythm, no murmurs / rubs / gallops. No extremity edema. 2+ pedal pulses. No carotid bruits.  Abdomen: no tenderness, no masses palpated. No hepatosplenomegaly. Bowel sounds positive.  Musculoskeletal: no clubbing / cyanosis. No joint deformity upper and lower extremities. Good ROM, no contractures. Normal muscle tone.  Skin: no rashes, lesions, ulcers. No induration Neurologic: CN 2-12 grossly intact. Sensation intact, DTR normal. Strength 5/5 in all 4.  Psychiatric: Normal judgment and insight. Alert and oriented x 3. Normal mood.   Data Reviewed:      Assessment and Plan: * Acute saddle pulmonary embolism with acute cor pulmonale (HCC) Submassive PE with RHS, trop leak - TTE, US doppler BLE ordered - heparin gtt - consideration of IR involvement for catheter directed lytics should he deteriorate - peripheral lytics emergently if develops massive PE physiology - bedrest tonight - CPAP tonight  Diabetes mellitus type 2 in obese (HCC) Hold metformin Mod scale SSI AC/HS Given morbid obesity with BMI 46, may want to pursue ozempic or mounjaro as outpt (strongly recommend if insurance will make this affordable).  OSA on CPAP CPAP QHS per home settings.  Morbid obesity (HCC) With DM2, consider if candidate (will insurance company cover) for [employer] or ozempic as outpt.  HTN (hypertension) Hold metoprolol      Advance Care Planning:   Code Status: Full Code  Consults: PCCM  Family Communication: Family at bedside  Severity of Illness: The appropriate patient status for this patient is INPATIENT. Inpatient status is judged to be reasonable and necessary in order to provide the required intensity of service to ensure the patient's safety. The patient's presenting symptoms, physical exam  findings, and initial radiographic and laboratory data in the context of their chronic comorbidities is felt to place them at high risk for further clinical deterioration. Furthermore, it is not anticipated that the patient will be medically stable for discharge from the hospital within 2 midnights of admission.   * I certify that at the point of admission it is my clinical judgment that the patient will require inpatient hospital care spanning beyond 2 midnights from the point of admission due to high intensity of service, high risk for further deterioration and high frequency of surveillance required.*  Author: Hillary Bow., DO 03/11/2022 9:51 PM  For on call review [email or web address].

## 2022-03-11 NOTE — Progress Notes (Incomplete Revision)
03/11/2022 APP Scott George and I saw and evaluated the patient. Discussed with them and agree with their findings and plan as documented in the their note.   S:  61yM obesity with presyncope found to have saddle PE, right heart strain at [facility] transferred in case needs catheter directed lytics or alternative approach should he deteriorate.  O: Blood pressure (!) 152/82, pulse 88, temperature 98.6 F (37 C), temperature source Oral, resp. rate 14, height 6' (1.829 m), weight (!) 154.2 kg, SpO2 99 %.   Exam: Gen:       No acute distress HEENT:  tracking, sclera anicteric Neck:      No masses, JVD Lungs:    Clear to auscultation bilaterally; normal respiratory effort CV:         Regular rate and rhythm; no murmurs Abd:      + bowel sounds; soft, non-tender; no palpable masses, no distension Ext:    No edema; adequate peripheral perfusion Skin:       Warm and dry; no rash Neuro:    alert and oriented x 3  Labs/imaging reviewed Trop 134 -> 211  BNP 27  A:  # Saddle PE with right heart strain Technicallly low risk by sPESI interestingly (0 points - 1.1% risk 30d mortality). 1-4% risk significant ICH with lytics depending on strategy (lowest with catheter directed, highest with peripheral), possibility of improving symptoms faster with lytics but not affecting mortality or long term patient centered outcomes. Really not very symptomatic unless he's up walking around  P:  - TTE, US doppler BLE - trend trop - heparin gtt and consideration of IR involvement for catheter directed lytics should he deteriorate, peripheral lytics if develops massive PE physiology - bedrest tonight  We will follow   Nathan Khaniyah Bezek [facility] Pulmonary/Critical Care   

## 2022-03-11 NOTE — ED Provider Notes (Signed)
Physical Exam  BP (!) 140/105 (BP Location: Left Arm)   Pulse 93   Temp 98.6 F (37 C) (Oral)   Resp 14   Ht 6' (1.829 m)   Wt (!) 154.2 kg   SpO2 98%   BMI 46.11 kg/m   Physical Exam Vitals and nursing note reviewed.  Constitutional:      General: He is not in acute distress.    Appearance: Normal appearance. He is obese.  HENT:     Mouth/Throat:     Mouth: Mucous membranes are moist.  Eyes:     Conjunctiva/sclera: Conjunctivae normal.  Cardiovascular:     Rate and Rhythm: Regular rhythm. Tachycardia present.  Pulmonary:     Effort: Pulmonary effort is normal. No respiratory distress.     Breath sounds: Normal breath sounds.  Abdominal:     General: Abdomen is flat.     Palpations: Abdomen is soft.     Tenderness: There is no abdominal tenderness.  Musculoskeletal:     Right lower leg: No edema.     Left lower leg: No edema.  Skin:    General: Skin is warm and dry.     Capillary Refill: Capillary refill takes less than 2 seconds.  Neurological:     Mental Status: He is alert and oriented to person, place, and time. Mental status is at baseline.  Psychiatric:        Mood and Affect: Mood normal.        Behavior: Behavior normal.     Procedures  .Critical Care  Performed by: Lonell Grandchild, MD Authorized by: Lonell Grandchild, MD   Critical care provider statement:    Critical care time (minutes):  30   Critical care was necessary to treat or prevent imminent or life-threatening deterioration of the following conditions:  Cardiac failure and respiratory failure   Critical care was time spent personally by me on the following activities:  Development of treatment plan with patient or surrogate, discussions with consultants, evaluation of patient's response to treatment, examination of patient, ordering and review of laboratory studies, ordering and review of radiographic studies, ordering and performing treatments and interventions, pulse oximetry,  re-evaluation of patient's condition and review of old charts   I assumed direction of critical care for this patient from another provider in my specialty: yes     Care discussed with: admitting provider     ED Course / MDM   Clinical Course as of 03/11/22 2209  Fri Mar 11, 2022  1845 Pulmnology  [CC]  2137 Patient transfer from outside facility after being diagnosed with pulmonary embolism in the setting of new onset dyspnea and near syncope.  Patient had saddle PE.  He is started on heparin.  I evaluated the patient and he has no complaints.  Currently hemodynamically stable.  I discussed with pulmonology, they feel that since the patient has a low PESI  score, and is hemodynamically stable, it is unlikely that interventional radiology will pursue intervention at this time.  They will evaluate the patient.  They recommend admission to the stepdown unit.  Will page the hospitalist. [WS]    Clinical Course User Index [CC] Glyn Ade, MD [WS] Lonell Grandchild, MD   Medical Decision Making Problems Addressed: Acute saddle pulmonary embolism with acute cor pulmonale [facility]): acute illness or injury that poses a threat to life or bodily functions  Amount and/or Complexity of Data Reviewed Independent Historian: spouse and EMS Labs: ordered. Radiology: ordered. ECG/medicine  tests: ordered.  Risk OTC drugs. Prescription drug management. Drug therapy requiring intensive monitoring for toxicity. Decision regarding hospitalization. Diagnosis or treatment significantly limited by social determinants of health.          Lonell Grandchild, MD 03/11/22 2209

## 2022-03-11 NOTE — Assessment & Plan Note (Signed)
Hold metformin Mod scale SSI AC/HS Given morbid obesity with BMI 46, may want to pursue ozempic or mounjaro as outpt (strongly recommend if insurance will make this affordable).

## 2022-03-11 NOTE — Consult Note (Signed)
NAME:  Jeffrey Molina, MRN:  [id number], DOB:  01/06/1961, LOS: 0 ADMISSION DATE:  03/11/2022, CONSULTATION DATE:  12/22 REFERRING MD:  Doran Durand, CHIEF COMPLAINT:  SOB   History of Present Illness:  Jeffrey Molina is an 61 y.o. M who presented to [facility] with a chief complaint of shortness of breath  They have a pertinent past medical history of HTN, DMII, OSA, GERD, PAD  Reports of fatigue over the week. Patient with lightheadedness and near syncope at work on 12/22 with continued SOB on exertion.   Patient with O2 sat of 97% on room air. CTA chest demonstrated submassive saddle PE. No documented hypotension or tachycardia. Troponin 134.  PCCM was called for admission. ED to ED transfer was initiated.   Pertinent  Medical History  HTN, DMII, OSA, GERD, PAD  Significant Hospital Events: Including procedures, antibiotic start and stop dates in addition to other pertinent events   12/22 Presented to [facility], CT chest: submassive PE, TXR to [facility]  Interim History / Subjective:  See above  Subjective: Denies chest pain, SOB improving  Objective   Blood pressure (!) 140/105, pulse 93, temperature 98.6 F (37 C), temperature source Oral, resp. rate 14, height 6' (1.829 m), weight (!) 154.2 kg, SpO2 98 %.        Intake/Output Summary (Last 24 hours) at 03/11/2022 2211 Last data filed at 03/11/2022 1916 Gross per 24 hour  Intake 1000 ml  Output --  Net 1000 ml   Filed Weights   03/11/22 1435  Weight: (!) 154.2 kg    Examination: General: In bed, NAD, appears comfortable, obese HEENT: MM pink/moist, anicteric, atraumatic Neuro: RASS 0, PERRL 42mm, GCS 15 CV: S1S2, NSR, no m/r/g appreciated PULM:  clear in the upper lobes, clear in the lower lobes, trachea midline, chest expansion symmetric GI: soft, bsx4 active, non-tender   Extremities: warm/dry, no pretibial edema, capillary refill less than 3 seconds  Skin:  no rashes or lesions noted  Labs/imaging AST 63, ALT 63 Lipase  WNL Troponin 134 CTA chest demonstrated submassive saddle PE. No documented hypotension or tachycardia CXR; no pneumo, infiltrAte or effusion ECG: No significant st changes present   Resolved Hospital Problem list     Assessment & Plan:  Submassive Pulmonary embolism S-PESI: 0, no hypotension, no tachycardia. RV to LV ratio 1.4. Saddle PE with additional emboli extending into the inferior and superior lobar arteries. Denies syncope. Only pre-syncope. Patient denies risk factors for PE. No family hx. No cancer. No immobility. No recent trips. Obese. Seems unprovoked. Dr. Thora Lance discussed treatment options. Patient is open to cathether directed thrombolysis if symptoms worsen but  would like conservative MGMT at this time with heparin GTT. P: -Stable to admit to progressive care. Continuous telemetry and SPO2 monitoring -Systemic heparin  -Follow up LE dopplers -Obtain ECHO  -Bedrest overnight -Consider hematology follow up outpatient for work up  Troponin Elevation, suspect secondary to demand -Continuous telemetry -Trend  HTN -Hold home antihypertensives overnight  OSA -continue CPAP qhs  DMII -Blood Glucose goal 140-180. -SSI  GERD -PPI  Best Practice (right click and "Reselect all SmartList Selections" daily)   Per primary  Labs   CBC: Recent Labs  Lab 03/11/22 1616  WBC 10.1  NEUTROABS 7.9*  HGB 14.0  HCT 43.8  MCV 90.5  PLT 203    Basic Metabolic Panel: Recent Labs  Lab 03/11/22 1616  NA 139  K 3.6  CL 106  CO2 26  GLUCOSE 126*  BUN 16  CREATININE 1.18  CALCIUM 8.4*   GFR: Estimated Creatinine Clearance: 103.2 mL/min (by C-G formula based on SCr of 1.18 mg/dL). Recent Labs  Lab 03/11/22 1616  WBC 10.1    Liver Function Tests: Recent Labs  Lab 03/11/22 1616  AST 63*  ALT 63*  ALKPHOS 66  BILITOT 0.6  PROT 7.5  ALBUMIN 3.6   Recent Labs  Lab 03/11/22 1616  LIPASE 48   No results for input(s): "AMMONIA" in the last 168  hours.  ABG No results found for: "PHART", "PCO2ART", "PO2ART", "HCO3", "TCO2", "ACIDBASEDEF", "O2SAT"   Coagulation Profile: No results for input(s): "INR", "PROTIME" in the last 168 hours.  Cardiac Enzymes: No results for input(s): "CKTOTAL", "CKMB", "CKMBINDEX", "TROPONINI" in the last 168 hours.  HbA1C: Hgb A1c MFr Bld  Date/Time Value Ref Range Status  07/21/2020 07:52 AM 6.2 4.6 - 6.5 % Final    Comment:    Glycemic Control Guidelines for People with Diabetes:Non Diabetic:  <6%Goal of Therapy: <7%Additional Action Suggested:  >8%   10/25/2016 05:00 PM 5.7 4.6 - 6.5 % Final    Comment:    Glycemic Control Guidelines for People with Diabetes:Non Diabetic:  <6%Goal of Therapy: <7%Additional Action Suggested:  >8%     CBG: No results for input(s): "GLUCAP" in the last 168 hours.  Review of Systems:   Positives in bold  Gen: fever, chills, weight change, fatigue, night sweats HEENT:  blurred vision, double vision, hearing loss, tinnitus, sinus congestion, rhinorrhea, sore throat, neck stiffness, dysphagia PULM:  shortness of breath, cough, sputum production, hemoptysis, wheezing CV: chest pain, edema, orthopnea, paroxysmal nocturnal dyspnea, palpitations GI:  abdominal pain, nausea, vomiting, diarrhea, hematochezia, melena, constipation, change in bowel habits GU: dysuria, hematuria, polyuria, oliguria, urethral discharge Endocrine: hot or cold intolerance, polyuria, polyphagia or appetite change Derm: rash, dry skin, scaling or peeling skin change Heme: easy bruising, bleeding, bleeding gums Neuro: headache, numbness, weakness, slurred speech, loss of memory or consciousness   Past Medical History:  He,  has a past medical history of Anemia (10/28/2013), Arthritis, Chicken pox (as a child), Cutaneous skin tags (10/28/2013), Diabetes mellitus type 2 in obese (HCC) (01/27/2014), GERD (gastroesophageal reflux disease), History of chicken pox, Hypertension, Low testosterone  (10/28/2013), Peripheral artery disease (HCC) (11/03/2013), Preventative health care (10/26/2016), Sleep apnea, and Tachycardia (05/15/2014).   Surgical History:   Past Surgical History:  Procedure Laterality Date   colonscopy     x 2   GASTRIC ROUX-EN-Y N/A 09/02/2014   Procedure: LAPAROSCOPIC ROUX-EN-Y GASTRIC BYPASS WITH UPPER ENDOSCOPY;  Surgeon: Gaynelle Adu, MD;  Location: [facility];  Service: General;  Laterality: N/A;     Social History:   reports that he has never smoked. He has never used smokeless tobacco. He reports that he does not drink alcohol and does not use drugs.   Family History:  His family history includes Arthritis in his father; Cancer in his father; Cataracts in his father; Diabetes in his maternal grandfather; Fibromyalgia in his mother.   Allergies Allergies  Allergen Reactions   Lisinopril-Hydrochlorothiazide Swelling    Lip swollen     Home Medications  Prior to Admission medications   Medication Sig Start Date End Date Taking? Authorizing Provider  Calcium Carb-Cholecalciferol (CALCIUM 600 + D PO) Take 1 tablet by mouth 3 (three) times daily.    [provider]  diclofenac (VOLTAREN) 75 MG EC tablet Take 1 tablet (75 mg total) by mouth 2 (two) times daily. 12/30/21   Bradd Canary, MD  metFORMIN (GLUCOPHAGE) 500 MG tablet Take 1 tablet (500 mg total) by mouth daily with breakfast. 07/27/20   Saguier, Ramon Dredge, PA-C  Methylcobalamin (B-12) 5000 MCG TBDP Take 1 tablet by mouth daily.    [provider]  metoprolol tartrate (LOPRESSOR) 25 MG tablet TAKE ONE-HALF (1/2) TABLET TWICE A DAY 04/23/21   Bradd Canary, MD  Multiple Vitamin (MULTIVITAMIN WITH MINERALS) TABS tablet Take 1 tablet by mouth 2 (two) times daily.    [provider]  Probiotic Product (PROBIOTIC & ACIDOPHILUS EX ST PO) Take by mouth.    [provider]  sildenafil (VIAGRA) 50 MG tablet Take 1 tablet (50 mg total) by mouth daily as needed for erectile dysfunction.  07/13/20   Saguier, Ramon Dredge, PA-C  Vitamin D, Ergocalciferol, (DRISDOL) 1.25 MG (50000 UNIT) CAPS capsule TAKE 1 CAPSULE EVERY 7 DAYS 05/10/19   Bradd Canary, MD     Critical care time: n/a     Gershon Mussel., MSN, APRN, AGACNP-BC [facility]  03/11/2022 , 10:19 PM  Please see [email or web address] for pager details  If no response, please call [phone] After hours, please call Elink at [phone]

## 2022-03-11 NOTE — Assessment & Plan Note (Signed)
CPAP QHS per home settings 

## 2022-03-11 NOTE — Progress Notes (Addendum)
03/11/2022 APP Gweneth Fritter and I saw and evaluated the patient. Discussed with them and agree with their findings and plan as documented in the their note.   S:  61yM obesity with presyncope found to have saddle PE, right heart strain at [employer] [place] transferred in case needs catheter directed lytics or alternative approach should he deteriorate.  O: Blood pressure (!) 152/82, pulse 88, temperature 98.6 F (37 C), temperature source Oral, resp. rate 14, height 6' (1.829 m), weight (!) 154.2 kg, SpO2 99 %.   Exam: Gen:       No acute distress HEENT:  tracking, sclera anicteric Neck:      No masses, JVD Lungs:    Clear to auscultation bilaterally; normal respiratory effort CV:         Regular rate and rhythm; no murmurs Abd:      + bowel sounds; soft, non-tender; no palpable masses, no distension Ext:    No edema; adequate peripheral perfusion Skin:       Warm and dry; no rash Neuro:    alert and oriented x 3  Labs/imaging reviewed Trop 134 -> 211  BNP 27  A:  # Saddle PE with right heart strain Technicallly low risk by sPESI interestingly (0 points - 1.1% risk 30d mortality). 1-4% risk significant ICH with lytics depending on strategy (lowest with catheter directed, highest with peripheral), possibility of improving symptoms faster with lytics but not affecting mortality or long term patient centered outcomes. Really not very symptomatic unless he's up walking around  P:  - TTE, US doppler BLE - trend trop - heparin gtt and consideration of IR involvement for catheter directed lytics should he deteriorate, peripheral lytics if develops massive PE physiology - bedrest tonight  We will follow   Laroy Apple Pulmonary/Critical Care

## 2022-03-11 NOTE — ED Notes (Signed)
Pt seen in triage for shortness of breath. Upon arrival pt able to speak in full sentences, no distress noted. Pt endorses shortness of breath bad enough that he had to stop multiple times to catch his breath. Resting SpO2 98% on room air. RT will continue to be available as needed.

## 2022-03-12 ENCOUNTER — Inpatient Hospital Stay (HOSPITAL_COMMUNITY): Payer: BC Managed Care – PPO

## 2022-03-12 DIAGNOSIS — I2609 Other pulmonary embolism with acute cor pulmonale: Secondary | ICD-10-CM

## 2022-03-12 DIAGNOSIS — I2699 Other pulmonary embolism without acute cor pulmonale: Secondary | ICD-10-CM | POA: Diagnosis not present

## 2022-03-12 DIAGNOSIS — I2602 Saddle embolus of pulmonary artery with acute cor pulmonale: Secondary | ICD-10-CM | POA: Diagnosis not present

## 2022-03-12 LAB — BASIC METABOLIC PANEL
Anion gap: 8 (ref 5–15)
BUN: 12 mg/dL (ref 6–20)
CO2: 25 mmol/L (ref 22–32)
Calcium: 8.5 mg/dL — ABNORMAL LOW (ref 8.9–10.3)
Chloride: 110 mmol/L (ref 98–111)
Creatinine, Ser: 1.19 mg/dL (ref 0.61–1.24)
GFR, Estimated: >60 mL/min (ref >60)
Glucose, Bld: 120 mg/dL — ABNORMAL HIGH (ref 70–99)
Potassium: 4.3 mmol/L (ref 3.5–5.1)
Sodium: 143 mmol/L (ref 135–145)

## 2022-03-12 LAB — ECHOCARDIOGRAM COMPLETE
AR max vel: 2.93 cm2
AV Area VTI: 3.08 cm2
AV Area mean vel: 2.96 cm2
AV Mean grad: 2 mmHg
AV Peak grad: 4.5 mmHg
Ao pk vel: 1.06 m/s
Area-P 1/2: 3.74 cm2
Calc EF: 40.8 %
Height: 72 in
S' Lateral: 3.3 cm
Single Plane A2C EF: 44.3 %
Single Plane A4C EF: 36.4 %
Weight: 5440 oz

## 2022-03-12 LAB — CBC
HCT: 41.7 % (ref 39.0–52.0)
Hemoglobin: 13.3 g/dL (ref 13.0–17.0)
MCH: 29.4 pg (ref 26.0–34.0)
MCHC: 31.9 g/dL (ref 30.0–36.0)
MCV: 92.1 fL (ref 80.0–100.0)
Platelets: 205 10*3/uL (ref 150–400)
RBC: 4.53 MIL/uL (ref 4.22–5.81)
RDW: 14.1 % (ref 11.5–15.5)
WBC: 7.6 10*3/uL (ref 4.0–10.5)
nRBC: 0 % (ref 0.0–0.2)

## 2022-03-12 LAB — CBG MONITORING, ED: Glucose-Capillary: 131 mg/dL — ABNORMAL HIGH (ref 70–99)

## 2022-03-12 LAB — GLUCOSE, CAPILLARY
Glucose-Capillary: 116 mg/dL — ABNORMAL HIGH (ref 70–99)
Glucose-Capillary: 125 mg/dL — ABNORMAL HIGH (ref 70–99)
Glucose-Capillary: 103 mg/dL — ABNORMAL HIGH (ref 70–99)

## 2022-03-12 LAB — HEPARIN LEVEL (UNFRACTIONATED)
Heparin Unfractionated: <0.1 IU/mL — ABNORMAL LOW (ref 0.30–0.70)
Heparin Unfractionated: 0.16 IU/mL — ABNORMAL LOW (ref 0.30–0.70)
Heparin Unfractionated: 0.26 IU/mL — ABNORMAL LOW (ref 0.30–0.70)

## 2022-03-12 LAB — HIV ANTIBODY (ROUTINE TESTING W REFLEX): HIV Screen 4th Generation wRfx: NONREACTIVE

## 2022-03-12 MED ORDER — HEPARIN BOLUS VIA INFUSION
3400.0000 [IU] | Freq: Once | INTRAVENOUS | Status: AC
  Administered 2022-03-12: 3400 [IU] via INTRAVENOUS
  Filled 2022-03-12: qty 3400

## 2022-03-12 MED ORDER — HEPARIN BOLUS VIA INFUSION
3400.0000 [IU] | Freq: Once | INTRAVENOUS | Status: DC
  Filled 2022-03-12: qty 3400

## 2022-03-12 MED ORDER — HEPARIN BOLUS VIA INFUSION
3500.0000 [IU] | Freq: Once | INTRAVENOUS | Status: AC
  Administered 2022-03-12: 3500 [IU] via INTRAVENOUS
  Filled 2022-03-12: qty 3500

## 2022-03-12 MED ORDER — HEPARIN (PORCINE) 25000 UT/250ML-% IV SOLN: 1950.0000 [IU]/h | INTRAVENOUS | Status: DC

## 2022-03-12 MED ORDER — HEPARIN (PORCINE) 25000 UT/250ML-% IV SOLN
2150.0000 [IU]/h | INTRAVENOUS | Status: AC
  Administered 2022-03-12: 1950 [IU]/h via INTRAVENOUS
  Administered 2022-03-13: 2150 [IU]/h via INTRAVENOUS
  Filled 2022-03-12 (×2): qty 250

## 2022-03-12 MED ORDER — HEPARIN BOLUS VIA INFUSION
2000.0000 [IU] | Freq: Once | INTRAVENOUS | Status: AC
  Administered 2022-03-12: 2000 [IU] via INTRAVENOUS
  Filled 2022-03-12: qty 2000

## 2022-03-12 MED ORDER — PERFLUTREN LIPID MICROSPHERE
1.0000 mL | INTRAVENOUS | Status: AC | PRN
  Administered 2022-03-12: 4 mL via INTRAVENOUS

## 2022-03-12 NOTE — Progress Notes (Signed)
ANTICOAGULATION CONSULT NOTE   Pharmacy Consult for heparin  Indication: chest pain/ACS  Allergies  Allergen Reactions   Lisinopril-Hydrochlorothiazide Swelling    Lip swollen    Patient Measurements: Height: 6' (182.9 cm) Weight: (Abnormal) 154.2 kg (340 lb) IBW/kg (Calculated) : 77.6 Heparin Dosing Weight: 114.2  Vital Signs: Temp: 98.7 F (37.1 C) (12/23 0107) Temp Source: Oral (12/23 0107) BP: 132/91 (12/23 0200) Pulse Rate: 96 (12/23 0200)  Labs: Recent Labs    03/11/22 1616 03/11/22 1800 03/12/22 0105  HGB 14.0  --   --   HCT 43.8  --   --   PLT 203  --   --   HEPARINUNFRC  --   --  <0.10*  CREATININE 1.18  --   --   TROPONINIHS 134* 211*  --      Estimated Creatinine Clearance: 103.2 mL/min (by C-G formula based on SCr of 1.18 mg/dL).   Medical History: Past Medical History:  Diagnosis Date   Anemia 10/28/2013   pt. stated "no" at preop appt. 08-29-14   Arthritis    both knees   Chicken pox as a child   Cutaneous skin tags 10/28/2013   Diabetes mellitus type 2 in obese (HCC) 01/27/2014   GERD (gastroesophageal reflux disease)    History of chicken pox    Hypertension    Low testosterone 10/28/2013   Peripheral artery disease (HCC) 11/03/2013   Preventative health care 10/26/2016   Sleep apnea    has c-pap machine   Tachycardia 05/15/2014    Assessment: Patient admitted for SOB found to have saddle PE. Initially heparin started for ACS consult. Patient not on anticoagulation PTA.   Initial heparin level undetectable, no issues with infusion. No overt s/sx of bleeding reported.   Goal of Therapy:  Heparin level 0.3-0.7 units/ml Monitor platelets by anticoagulation protocol: Yes   Plan:  Give 3500 units bolus x 1 Increase heparin infusion to 1600 units/hr Check anti-Xa level in 6 hours and daily while on heparin Continue to monitor H&H and platelets  Ruben Im, PharmD Clinical Pharmacist 03/12/2022 2:28 AM Please check AMION for all [facility]  Pharmacy numbers

## 2022-03-12 NOTE — Progress Notes (Signed)
VASCULAR LAB    Bilateral lower extremity venous duplex has been performed.  See CV proc for preliminary results.   Jenascia Bumpass, RVT 03/12/2022, 9:48 AM

## 2022-03-12 NOTE — Progress Notes (Signed)
ANTICOAGULATION CONSULT NOTE   Pharmacy Consult for heparin  Indication: pulmonary embolus  Allergies  Allergen Reactions   Lisinopril-Hydrochlorothiazide Swelling    Lip swollen    Patient Measurements: Height: 6' (182.9 cm) Weight: (!) 152.1 kg (335 lb 4.8 oz) IBW/kg (Calculated) : 77.6 Heparin Dosing Weight: 113.5 kg  Vital Signs: Temp: 97.7 F (36.5 C) (12/23 1046) Temp Source: Oral (12/23 1046) BP: 131/79 (12/23 1046) Pulse Rate: 92 (12/23 1046)  Labs: Recent Labs    03/11/22 1616 03/11/22 1800 03/12/22 0105 03/12/22 0650  HGB 14.0  --   --  13.3  HCT 43.8  --   --  41.7  PLT 203  --   --  205  HEPARINUNFRC  --   --  <0.10*  --   CREATININE 1.18  --   --  1.19  TROPONINIHS 134* 211*  --   --      Estimated Creatinine Clearance: 101.5 mL/min (by C-G formula based on SCr of 1.19 mg/dL).   Medical History: Past Medical History:  Diagnosis Date   Anemia 10/28/2013   pt. stated "no" at preop appt. 08-29-14   Arthritis    both knees   Chicken pox as a child   Cutaneous skin tags 10/28/2013   Diabetes mellitus type 2 in obese (HCC) 01/27/2014   GERD (gastroesophageal reflux disease)    History of chicken pox    Hypertension    Low testosterone 10/28/2013   Peripheral artery disease (HCC) 11/03/2013   Preventative health care 10/26/2016   Sleep apnea    has c-pap machine   Tachycardia 05/15/2014    Assessment: Patient admitted for SOB found to have saddle PE. Initially heparin started for ACS consult. Patient not on anticoagulation PTA.   Heparin level is subtherapeutic this afternoon at 0.16 on 1600 units/hour. Per RN, no issues with infusion, no signs of bleeding noted. CBC stable.   Goal of Therapy:  Heparin level 0.3-0.7 units/ml Monitor platelets by anticoagulation protocol: Yes   Plan:  Give heparin 3400 unit bolus x1 Increase heparin infusion to 1950 units/hour Check heparin level in 6 hours and daily while on heparin Monitor daily CBC and  signs/symptoms of bleeding  Rockwell Alexandria, PharmD, [facility] PGY1 Pharmacy Resident 03/12/2022 10:52 AM

## 2022-03-12 NOTE — Progress Notes (Signed)
NAME:  Jeffrey Molina, MRN:  [id number], DOB:  1961/02/08, LOS: 1 ADMISSION DATE:  03/11/2022, CONSULTATION DATE:  12/22 REFERRING MD:  Doran Durand, CHIEF COMPLAINT:  SOB   History of Present Illness:  Jeffrey Molina is an 61 y.o. M who presented to [facility] with a chief complaint of shortness of breath  They have a pertinent past medical history of HTN, DMII, OSA, GERD, PAD  Reports of fatigue over the week. Patient with lightheadedness and near syncope at work on 12/22 with continued SOB on exertion.   Patient with O2 sat of 97% on room air. CTA chest demonstrated submassive saddle PE. No documented hypotension or tachycardia. Troponin 134.  PCCM was called for admission. ED to ED transfer was initiated.   Pertinent  Medical History  HTN, DMII, OSA, GERD, PAD  Significant Hospital Events: Including procedures, antibiotic start and stop dates in addition to other pertinent events   12/22 Presented to [facility], CT chest: submassive PE, TXR to [facility]  Interim History / Subjective:   No acute events overnight Patient is feeling better today  Objective   Blood pressure 127/87, pulse 89, temperature 98.6 F (37 C), temperature source Oral, resp. rate 14, height 6' (1.829 m), weight (!) 154.2 kg, SpO2 93 %.        Intake/Output Summary (Last 24 hours) at 03/12/2022 7026 Last data filed at 03/12/2022 0803 Gross per 24 hour  Intake 1000 ml  Output 1650 ml  Net -650 ml   Filed Weights   03/11/22 1435  Weight: (!) 154.2 kg    Examination: General: In bed, NAD, appears comfortable, obese HEENT: MM pink/moist, anicteric, atraumatic Neuro: alert, oriented moving all extremities CV: S1S2, NSR, no m/r/g appreciated PULM: clear to auscultation bilaterally GI: soft, bsx4 active, non-tender   Extremities: warm/dry, trace pretibial edema, capillary refill less than 3 seconds  Skin:  no rashes or lesions noted  TTE: LV EF 50-55%. RV systolic function is mildly reduced, RV mildly enlarged.    LE US - DVT of LLE  Resolved Hospital Problem list     Assessment & Plan:  Submassive Pulmonary embolism LLE DVT S-PESI: 0, no hypotension, no tachycardia. RV to LV ratio 1.4. Saddle PE with additional emboli extending into the inferior and superior lobar arteries. Denies syncope. Only pre-syncope. Patient denies risk factors for PE. No family hx. No cancer. No immobility. No recent trips. Obese. Seems unprovoked. Patient is open to cathether directed thrombolysis if symptoms worsen but would like conservative management at this time with heparin GTT. P: -Continuous telemetry and SPO2 monitoring -Systemic heparin for 48 hours then transition to DOAC -LE dopplers with LLE DVT -ECHO without significant RH strain -Consider hematology follow up outpatient for work up  Troponin Elevation, suspect secondary to demand -Continuous telemetry -Trend  HTN -Hold home antihypertensives  OSA -continue CPAP qhs  DMII -Blood Glucose goal 140-180. -SSI  GERD -PPI  Best Practice (right click and "Reselect all SmartList Selections" daily)   Per primary  Labs   CBC: Recent Labs  Lab 03/11/22 1616 03/12/22 0650  WBC 10.1 7.6  NEUTROABS 7.9*  --   HGB 14.0 13.3  HCT 43.8 41.7  MCV 90.5 92.1  PLT 203 205    Basic Metabolic Panel: Recent Labs  Lab 03/11/22 1616 03/12/22 0650  NA 139 143  K 3.6 4.3  CL 106 110  CO2 26 25  GLUCOSE 126* 120*  BUN 16 12  CREATININE 1.18 1.19  CALCIUM 8.4* 8.5*   GFR:  Estimated Creatinine Clearance: 102.3 mL/min (by C-G formula based on SCr of 1.19 mg/dL). Recent Labs  Lab 03/11/22 1616 03/12/22 0650  WBC 10.1 7.6    Liver Function Tests: Recent Labs  Lab 03/11/22 1616  AST 63*  ALT 63*  ALKPHOS 66  BILITOT 0.6  PROT 7.5  ALBUMIN 3.6   Recent Labs  Lab 03/11/22 1616  LIPASE 48   No results for input(s): "AMMONIA" in the last 168 hours.  ABG No results found for: "PHART", "PCO2ART", "PO2ART", "HCO3", "TCO2",  "ACIDBASEDEF", "O2SAT"   Coagulation Profile: No results for input(s): "INR", "PROTIME" in the last 168 hours.  Cardiac Enzymes: No results for input(s): "CKTOTAL", "CKMB", "CKMBINDEX", "TROPONINI" in the last 168 hours.  HbA1C: Hgb A1c MFr Bld  Date/Time Value Ref Range Status  07/21/2020 07:52 AM 6.2 4.6 - 6.5 % Final    Comment:    Glycemic Control Guidelines for People with Diabetes:Non Diabetic:  <6%Goal of Therapy: <7%Additional Action Suggested:  >8%   10/25/2016 05:00 PM 5.7 4.6 - 6.5 % Final    Comment:    Glycemic Control Guidelines for People with Diabetes:Non Diabetic:  <6%Goal of Therapy: <7%Additional Action Suggested:  >8%     CBG: Recent Labs  Lab 03/11/22 2234 03/12/22 0804  GLUCAP 110* 131*       Critical care time: n/a     Melody Comas, MD [facility] Office: [phone]   See Amion for personal pager PCCM on call pager [phone] until 7pm. Please call Elink 7p-7a. [phone]

## 2022-03-12 NOTE — Progress Notes (Signed)
ANTICOAGULATION CONSULT NOTE   Pharmacy Consult for heparin  Indication: pulmonary embolus  Allergies  Allergen Reactions   Lisinopril-Hydrochlorothiazide Swelling    Lip swollen    Patient Measurements: Height: 6' (182.9 cm) Weight: (!) 152.1 kg (335 lb 4.8 oz) IBW/kg (Calculated) : 77.6 Heparin Dosing Weight: 113.5 kg  Vital Signs: Temp: 97.8 F (36.6 C) (12/23 2038) Temp Source: Oral (12/23 2038) BP: 144/91 (12/23 2038) Pulse Rate: 80 (12/23 2038)  Labs: Recent Labs    03/11/22 1616 03/11/22 1800 03/12/22 0105 03/12/22 0650 03/12/22 1134 03/12/22 2020  HGB 14.0  --   --  13.3  --   --   HCT 43.8  --   --  41.7  --   --   PLT 203  --   --  205  --   --   HEPARINUNFRC  --   --  <0.10*  --  0.16* 0.26*  CREATININE 1.18  --   --  1.19  --   --   TROPONINIHS 134* 211*  --   --   --   --      Estimated Creatinine Clearance: 101.5 mL/min (by C-G formula based on SCr of 1.19 mg/dL).   Medical History: Past Medical History:  Diagnosis Date   Anemia 10/28/2013   pt. stated "no" at preop appt. 08-29-14   Arthritis    both knees   Chicken pox as a child   Cutaneous skin tags 10/28/2013   Diabetes mellitus type 2 in obese (HCC) 01/27/2014   GERD (gastroesophageal reflux disease)    History of chicken pox    Hypertension    Low testosterone 10/28/2013   Peripheral artery disease (HCC) 11/03/2013   Preventative health care 10/26/2016   Sleep apnea    has c-pap machine   Tachycardia 05/15/2014    Assessment: Patient admitted for SOB found to have saddle PE. Initially heparin started for ACS consult. Patient not on anticoagulation PTA.   Heparin level is subtherapeutic this afternoon at 0.16 on 1600 units/hour. Per RN, no issues with infusion, no signs of bleeding noted. CBC stable.   Heparin came back slightly subtherapeutic. We will repeat a small bolus and increase rate again.   Goal of Therapy:  Heparin level 0.3-0.7 units/ml Monitor platelets by anticoagulation  protocol: Yes   Plan:  Give heparin 2000 unit bolus x1 Increase heparin infusion to 2150 units/hour Check heparin level in AM and daily while on heparin Monitor daily CBC and signs/symptoms of bleeding  Ulyses Southward, PharmD, BCIDP, AAHIVP, CPP Infectious Disease Pharmacist 03/12/2022 9:19 PM

## 2022-03-12 NOTE — Progress Notes (Signed)
  Progress Note   Patient: Jeffrey Molina PXT:[id number] DOB: Nov 16, 1960 DOA: 03/11/2022     1 DOS: the patient was seen and examined on 03/12/2022   Brief hospital course: Rustin Erhart is a 61 y.o. male with medical history significant of HTN, DM2, obesity, OSA. Pt with sudden onset of DOE earlier today while sitting at desk at work. Had near syncope.  No fevers, chills, N/V. Dyspnea persistent over past 4 hours since onset.scented to med [facility], then transferred here when found to have concern for massive PE.  Was seen in consultation by pulmonary critical care, who recommended heparin drip for now, direct thrombolytics only if develops massive PE physiology.  Currently the patient is on heparin drip and tolerating well.  Assessment and Plan: * Acute saddle pulmonary embolism with acute cor pulmonale (HCC) Submassive PE with RHS, trop leak - TTE, US doppler BLE ordered and pending - heparin gtt - consideration of IR involvement for catheter directed lytics should he deteriorate - peripheral lytics emergently if develops massive PE physiology - bedrest tonight, pulmonary critical care following - CPAP tonight  Diabetes mellitus type 2 in obese (HCC) Hold metformin Mod scale SSI AC/HS Given morbid obesity with BMI 46, may want to pursue ozempic or mounjaro as outpt (strongly recommend if insurance will make this affordable).  OSA on CPAP CPAP QHS per home settings.  Morbid obesity (HCC) With DM2, consider if candidate (will insurance company cover) for mounjaro or ozempic as outpt.  HTN (hypertension) Hold metoprolol     Subjective:  Seen in his room in the ER, resting comfortably on room air with no complaints.  States that he has some very mild dyspnea if he has to ambulate.  Otherwise no dyspnea at rest, or when he sits up on the edge of the bed to urinate.  Physical Exam: Vitals:   03/12/22 0500 03/12/22 0600 03/12/22 0656 03/12/22 0700  BP: 128/81 (!) 122/91   127/87  Pulse: 97 85  89  Resp: 19 17  14   Temp:   98.6 F (37 C)   TempSrc:   Oral   SpO2: 90% 94%  93%  Weight:      Height:      Constitutional: NAD, calm, comfortable Eyes: PERRL, lids and conjunctivae normal ENMT: Mucous membranes are moist. Posterior pharynx clear of any exudate or lesions.Normal dentition.  Neck: normal, supple, no masses, no thyromegaly Respiratory: clear to auscultation bilaterally, no wheezing, no crackles. Normal respiratory effort. No accessory muscle use.  Cardiovascular: Regular rate and rhythm, no murmurs / rubs / gallops. No extremity edema. 2+ pedal pulses. No carotid bruits.  Abdomen: no tenderness, no masses palpated. No hepatosplenomegaly. Bowel sounds positive.  Musculoskeletal: no clubbing / cyanosis. No joint deformity upper and lower extremities. Good ROM, no contractures. Normal muscle tone.  Skin: no rashes, lesions, ulcers. No induration Neurologic: CN 2-12 grossly intact. Sensation intact, DTR normal. Strength 5/5 in all 4.  Psychiatric: Normal judgment and insight. Alert and oriented x 3. Normal mood.  Data Reviewed:  CBC and BMP unremarkable, troponin 134.  Family Communication: No family present at the bedside, patient is awake alert and oriented.  Disposition: Status is: Inpatient  Time spent: 45 minutes  Author: Gailyn Crook , MD 03/12/2022 7:41 AM  For on call review [email or web address].

## 2022-03-12 NOTE — ED Notes (Signed)
Heparin increased to 16 unit/hr and 3500 unit bolus given per pharmacy due to lower than desired heparin level

## 2022-03-12 NOTE — Progress Notes (Signed)
  Echocardiogram 2D Echocardiogram has been performed.  Jeffrey Molina 03/12/2022, 3:29 PM

## 2022-03-12 NOTE — ED Notes (Signed)
Patient remains in vascular lab 

## 2022-03-13 DIAGNOSIS — I2602 Saddle embolus of pulmonary artery with acute cor pulmonale: Secondary | ICD-10-CM | POA: Diagnosis not present

## 2022-03-13 LAB — CBC
HCT: 40.5 % (ref 39.0–52.0)
Hemoglobin: 12.8 g/dL — ABNORMAL LOW (ref 13.0–17.0)
MCH: 29 pg (ref 26.0–34.0)
MCHC: 31.6 g/dL (ref 30.0–36.0)
MCV: 91.8 fL (ref 80.0–100.0)
Platelets: 208 10*3/uL (ref 150–400)
RBC: 4.41 MIL/uL (ref 4.22–5.81)
RDW: 14.2 % (ref 11.5–15.5)
WBC: 6.5 10*3/uL (ref 4.0–10.5)
nRBC: 0 % (ref 0.0–0.2)

## 2022-03-13 LAB — GLUCOSE, CAPILLARY
Glucose-Capillary: 116 mg/dL — ABNORMAL HIGH (ref 70–99)
Glucose-Capillary: 107 mg/dL — ABNORMAL HIGH (ref 70–99)
Glucose-Capillary: 118 mg/dL — ABNORMAL HIGH (ref 70–99)
Glucose-Capillary: 121 mg/dL — ABNORMAL HIGH (ref 70–99)

## 2022-03-13 LAB — HEPARIN LEVEL (UNFRACTIONATED)
Heparin Unfractionated: 0.42 IU/mL (ref 0.30–0.70)
Heparin Unfractionated: 0.39 IU/mL (ref 0.30–0.70)

## 2022-03-13 MED ORDER — APIXABAN 5 MG PO TABS: 5.0000 mg | ORAL_TABLET | Freq: Two times a day (BID) | ORAL | Status: DC

## 2022-03-13 MED ORDER — APIXABAN 5 MG PO TABS
10.0000 mg | ORAL_TABLET | Freq: Two times a day (BID) | ORAL | Status: DC
  Administered 2022-03-13 – 2022-03-14 (×2): 10 mg via ORAL
  Filled 2022-03-13 (×2): qty 2

## 2022-03-13 NOTE — Progress Notes (Signed)
ANTICOAGULATION CONSULT NOTE   Pharmacy Consult for heparin/apixaban Indication: pulmonary embolus  Allergies  Allergen Reactions   Lisinopril-Hydrochlorothiazide Swelling    Lip swollen    Patient Measurements: Height: 6' (182.9 cm) Weight: (!) 152.1 kg (335 lb 4.8 oz) IBW/kg (Calculated) : 77.6 Heparin Dosing Weight: 113.5 kg  Vital Signs: Temp: 98.2 F (36.8 C) (12/24 0651) Temp Source: Oral (12/24 0651) BP: 153/90 (12/24 0651) Pulse Rate: 71 (12/24 0651)  Labs: Recent Labs    03/11/22 1616 03/11/22 1800 03/12/22 0105 03/12/22 0650 03/12/22 1134 03/12/22 2020 03/13/22 0542  HGB 14.0  --   --  13.3  --   --  12.8*  HCT 43.8  --   --  41.7  --   --  40.5  PLT 203  --   --  205  --   --  208  HEPARINUNFRC  --   --    < >  --  0.16* 0.26* 0.42  CREATININE 1.18  --   --  1.19  --   --   --   TROPONINIHS 134* 211*  --   --   --   --   --    < > = values in this interval not displayed.     Estimated Creatinine Clearance: 101.5 mL/min (by C-G formula based on SCr of 1.19 mg/dL).   Medical History: Past Medical History:  Diagnosis Date   Anemia 10/28/2013   pt. stated "no" at preop appt. 08-29-14   Arthritis    both knees   Chicken pox as a child   Cutaneous skin tags 10/28/2013   Diabetes mellitus type 2 in obese (HCC) 01/27/2014   GERD (gastroesophageal reflux disease)    History of chicken pox    Hypertension    Low testosterone 10/28/2013   Peripheral artery disease (HCC) 11/03/2013   Preventative health care 10/26/2016   Sleep apnea    has c-pap machine   Tachycardia 05/15/2014    Assessment: Patient admitted for SOB found to have saddle PE. Initially heparin started for ACS consult. Patient not on anticoagulation PTA.   Heparin level therapeutic at 0.39 on 2150 units/hour. CBC stable.  No signs/symptoms of bleeding noted Per MD, will transition to apixaban this evening.  Goal of Therapy:  Monitor platelets by anticoagulation protocol: Yes   Plan:   Continue heparin infusion at 2150 units/hour until 1800 tonight At 1800, switch to apixaban 10 mg BID x7 days, then 5 mg BID  Monitor CBC and signs/symptoms of bleeding  Rockwell Alexandria, PharmD, [facility] PGY1 Pharmacy Resident 03/13/2022 2:16 PM

## 2022-03-13 NOTE — Progress Notes (Signed)
NAME:  Jeffrey Molina, MRN:  [id number], DOB:  November 17, 1960, LOS: 2 ADMISSION DATE:  03/11/2022, CONSULTATION DATE:  12/22 REFERRING MD:  Doran Durand, CHIEF COMPLAINT:  SOB   History of Present Illness:  Jeffrey Molina is an 61 y.o. M who presented to [facility] with a chief complaint of shortness of breath  They have a pertinent past medical history of HTN, DMII, OSA, GERD, PAD  Reports of fatigue over the week. Patient with lightheadedness and near syncope at work on 12/22 with continued SOB on exertion.   Patient with O2 sat of 97% on room air. CTA chest demonstrated submassive saddle PE. No documented hypotension or tachycardia. Troponin 134.  PCCM was called for admission. ED to ED transfer was initiated.   Pertinent  Medical History  HTN, DMII, OSA, GERD, PAD  Significant Hospital Events: Including procedures, antibiotic start and stop dates in addition to other pertinent events   12/22 Presented to [facility], CT chest: submassive PE, TXR to [facility]. Heparin started around 5-6pm.  Interim History / Subjective:   No acute events overnight Patient is feeling better today Has tolerated walking to bathroom in his room.  Objective   Blood pressure (!) 159/105, pulse 71, temperature (!) 97.5 F (36.4 C), temperature source Oral, resp. rate 18, height 6' (1.829 m), weight (!) 152.1 kg, SpO2 98 %.        Intake/Output Summary (Last 24 hours) at 03/13/2022 0750 Last data filed at 03/13/2022 1884 Gross per 24 hour  Intake 584.78 ml  Output 750 ml  Net -165.22 ml   Filed Weights   03/11/22 1435 03/12/22 1046  Weight: (!) 154.2 kg (!) 152.1 kg    Examination: General: In bed, NAD, appears comfortable, obese HEENT: MM pink/moist, anicteric, atraumatic Neuro: alert, oriented moving all extremities CV: S1S2, NSR, no m/r/g appreciated PULM: clear to auscultation bilaterally GI: soft, bsx4 active, non-tender   Extremities: warm/dry, trace pretibial edema, capillary refill less than 3  seconds  Skin:  no rashes or lesions noted  TTE: LV EF 50-55%. RV systolic function is mildly reduced, RV mildly enlarged.   LE US - DVT of LLE  Resolved Hospital Problem list     Assessment & Plan:  Submassive Pulmonary embolism LLE DVT S-PESI: 0, no hypotension, no tachycardia. RV to LV ratio 1.4. Saddle PE with additional emboli extending into the inferior and superior lobar arteries. Denies syncope. Only pre-syncope. Patient denies risk factors for PE. No family hx. No cancer. No immobility. No recent trips. Obese. Seems unprovoked. Patient is open to cathether directed thrombolysis if symptoms worsen but would like conservative management at this time with heparin GTT. P: -Continuous telemetry and SPO2 monitoring -Continue heparin until this evening and can be transitioned to DOAC tonight. -LE dopplers with LLE DVT -ECHO without significant RH strain -Consider hematology follow up outpatient for work up  Troponin Elevation, suspect secondary to demand -Continuous telemetry -Trend  HTN -Hold home antihypertensives  OSA -continue CPAP qhs  DMII -Blood Glucose goal 140-180. -SSI  GERD -PPI  Best Practice (right click and "Reselect all SmartList Selections" daily)   Per primary  Labs   CBC: Recent Labs  Lab 03/11/22 1616 03/12/22 0650 03/13/22 0542  WBC 10.1 7.6 6.5  NEUTROABS 7.9*  --   --   HGB 14.0 13.3 12.8*  HCT 43.8 41.7 40.5  MCV 90.5 92.1 91.8  PLT 203 205 208    Basic Metabolic Panel: Recent Labs  Lab 03/11/22 1616 03/12/22 0650  NA 139  143  K 3.6 4.3  CL 106 110  CO2 26 25  GLUCOSE 126* 120*  BUN 16 12  CREATININE 1.18 1.19  CALCIUM 8.4* 8.5*   GFR: Estimated Creatinine Clearance: 101.5 mL/min (by C-G formula based on SCr of 1.19 mg/dL). Recent Labs  Lab 03/11/22 1616 03/12/22 0650 03/13/22 0542  WBC 10.1 7.6 6.5    Liver Function Tests: Recent Labs  Lab 03/11/22 1616  AST 63*  ALT 63*  ALKPHOS 66  BILITOT 0.6  PROT  7.5  ALBUMIN 3.6   Recent Labs  Lab 03/11/22 1616  LIPASE 48   No results for input(s): "AMMONIA" in the last 168 hours.  ABG No results found for: "PHART", "PCO2ART", "PO2ART", "HCO3", "TCO2", "ACIDBASEDEF", "O2SAT"   Coagulation Profile: No results for input(s): "INR", "PROTIME" in the last 168 hours.  Cardiac Enzymes: No results for input(s): "CKTOTAL", "CKMB", "CKMBINDEX", "TROPONINI" in the last 168 hours.  HbA1C: Hgb A1c MFr Bld  Date/Time Value Ref Range Status  07/21/2020 07:52 AM 6.2 4.6 - 6.5 % Final    Comment:    Glycemic Control Guidelines for People with Diabetes:Non Diabetic:  <6%Goal of Therapy: <7%Additional Action Suggested:  >8%   10/25/2016 05:00 PM 5.7 4.6 - 6.5 % Final    Comment:    Glycemic Control Guidelines for People with Diabetes:Non Diabetic:  <6%Goal of Therapy: <7%Additional Action Suggested:  >8%     CBG: Recent Labs  Lab 03/11/22 2234 03/12/22 0804 03/12/22 1054 03/12/22 1811 03/12/22 2045  GLUCAP 110* 131* 116* 125* 103*       Critical care time: n/a     Melody Comas, MD  Pulmonary & Critical Care Office: [phone]   See Amion for personal pager PCCM on call pager [phone] until 7pm. Please call Elink 7p-7a. [phone]

## 2022-03-13 NOTE — Progress Notes (Signed)
PROGRESS NOTE    Jeffrey MurphyWayne Molina  ZOX:[id number] DOB: 14-Feb-1961 DOA: 03/11/2022 PCP: Bradd CanaryBlyth, Stacey A, MD  Outpatient Specialists:     Brief Narrative:  As per HPI done on Admission by the ICU team:"Jeffrey Molina is an 61 y.o. M [facility] with a chief complaint of shortness of breath   They have a pertinent past medical history of HTN, DMII, OSA, GERD, PAD   Reports of fatigue over the week. Patient with lightheadedness and near syncope at work on 12/22 with continued SOB on exertion.    Patient with O2 sat of 97% on room air. CTA chest demonstrated submassive saddle PE. No documented hypotension or tachycardia. Troponin 134".  03/13/2022: Admitted with submassive pulmonary embolism on left lower extremity DVT.  Patient is currently on heparin drip.  Pulmonary/critical care input is appreciated..   Assessment & Plan:   Principal Problem:   Acute saddle pulmonary embolism with acute cor pulmonale (HCC) Active Problems:   HTN (hypertension)   Morbid obesity (HCC)   OSA on CPAP   Diabetes mellitus type 2 in obese (HCC)   Submassive Pulmonary embolism LLE DVT S-PESI: 0, no hypotension, no tachycardia. RV to LV ratio 1.4. Saddle PE with additional emboli extending into the inferior and superior lobar arteries. Denies syncope. Only pre-syncope. Patient denies risk factors for PE. No family hx. No cancer. No immobility. No recent trips. Obese. Seems unprovoked. Patient is open to cathether directed thrombolysis if symptoms worsen but would like conservative management at this time with heparin GTT. P: -Continuous telemetry and SPO2 monitoring -Continue Heparin drip -Transition to DOAC in 1-2 days (May need to continue Heparin/DOAC for a couple of days). -Patient is morbidly obese -Will follow up with PCP and Hematology team on DC.  Patient will likely need further work up. -LE dopplers with LLE DVT -ECHO without significant RH strain   Troponin Elevation, suspect  secondary to demand -Continuous telemetry -Trend   HTN -Hold home antihypertensives   OSA -continue CPAP qhs   DMII -Blood Glucose goal 140-180. -SSI   GERD -PPI     DVT prophylaxis: Heparin drip Code Status: Full Family Communication: Wife Disposition Plan: Home   Consultants:  Pulm/Critical care  Procedures:  None  Antimicrobials:  None   Subjective: No new complaints No pleuritic chest pain  Objective: Vitals:   03/12/22 1046 03/12/22 2038 03/13/22 0651 03/13/22 0744  BP: 131/79 (!) 144/91 (!) 153/90 (!) 159/105  Pulse: 92 80 71 71  Resp: 17 18 18 18   Temp: 97.7 F (36.5 C) 97.8 F (36.6 C) 98.2 F (36.8 C) (!) 97.5 F (36.4 C)  TempSrc: Oral Oral Oral Oral  SpO2: 96% 96% 98% 98%  Weight: (!) 152.1 kg     Height: 6' (1.829 m)       Intake/Output Summary (Last 24 hours) at 03/13/2022 1147 Last data filed at 03/13/2022 0839 Gross per 24 hour  Intake 984.78 ml  Output --  Net 984.78 ml   Filed Weights   03/11/22 1435 03/12/22 1046  Weight: (!) 154.2 kg (!) 152.1 kg    Examination:  General exam: Appears calm and comfortable.  Patient is morbidly obese Respiratory system: Clear to auscultation.   Cardiovascular system: S1 & S2 heard Gastrointestinal system: Abdomen is morbidly obese  Central nervous system: Alert and oriented. No focal neurological deficits.   Data Reviewed: I have personally reviewed following labs and imaging studies  CBC: Recent Labs  Lab 03/11/22 1616 03/12/22 0650 03/13/22 0542  WBC 10.1 7.6 6.5  NEUTROABS 7.9*  --   --   HGB 14.0 13.3 12.8*  HCT 43.8 41.7 40.5  MCV 90.5 92.1 91.8  PLT 203 205 208   Basic Metabolic Panel: Recent Labs  Lab 03/11/22 1616 03/12/22 0650  NA 139 143  K 3.6 4.3  CL 106 110  CO2 26 25  GLUCOSE 126* 120*  BUN 16 12  CREATININE 1.18 1.19  CALCIUM 8.4* 8.5*   GFR: Estimated Creatinine Clearance: 101.5 mL/min (by C-G formula based on SCr of 1.19 mg/dL). Liver  Function Tests: Recent Labs  Lab 03/11/22 1616  AST 63*  ALT 63*  ALKPHOS 66  BILITOT 0.6  PROT 7.5  ALBUMIN 3.6   Recent Labs  Lab 03/11/22 1616  LIPASE 48   No results for input(s): "AMMONIA" in the last 168 hours. Coagulation Profile: No results for input(s): "INR", "PROTIME" in the last 168 hours. Cardiac Enzymes: No results for input(s): "CKTOTAL", "CKMB", "CKMBINDEX", "TROPONINI" in the last 168 hours. BNP (last 3 results) No results for input(s): "PROBNP" in the last 8760 hours. HbA1C: No results for input(s): "HGBA1C" in the last 72 hours. CBG: Recent Labs  Lab 03/12/22 0804 03/12/22 1054 03/12/22 1811 03/12/22 2045 03/13/22 0743  GLUCAP 131* 116* 125* 103* 116*   Lipid Profile: No results for input(s): "CHOL", "HDL", "LDLCALC", "TRIG", "CHOLHDL", "LDLDIRECT" in the last 72 hours. Thyroid Function Tests: No results for input(s): "TSH", "T4TOTAL", "FREET4", "T3FREE", "THYROIDAB" in the last 72 hours. Anemia Panel: No results for input(s): "VITAMINB12", "FOLATE", "FERRITIN", "TIBC", "IRON", "RETICCTPCT" in the last 72 hours. Urine analysis:    Component Value Date/Time   COLORURINE YELLOW 11/07/2014 1218   APPEARANCEUR CLOUDY (A) 11/07/2014 1218   LABSPEC >=1.030 (A) 11/07/2014 1218   PHURINE 5.5 11/07/2014 1218   GLUCOSEU NEGATIVE 11/07/2014 1218   HGBUR NEGATIVE 11/07/2014 1218   BILIRUBINUR small (A) 09/10/2020 1610   KETONESUR moderate (40) (A) 09/10/2020 1610   KETONESUR TRACE (A) 11/07/2014 1218   UROBILINOGEN 1.0 09/10/2020 1610   UROBILINOGEN 0.2 11/07/2014 1218   NITRITE Negative 09/10/2020 1610   NITRITE NEGATIVE 11/07/2014 1218   LEUKOCYTESUR Moderate (2+) (A) 09/10/2020 1610   Sepsis Labs: @LABRCNTIP (procalcitonin:4,lacticidven:4)  ) Recent Results (from the past 240 hour(s))  Resp panel by RT-PCR (RSV, Flu A&B, Covid) Anterior Nasal Swab     Status: None   Collection Time: 03/11/22  2:40 PM   Specimen: Anterior Nasal Swab  Result  Value Ref Range Status   SARS Coronavirus 2 by RT PCR NEGATIVE NEGATIVE Final    Comment: (NOTE) SARS-CoV-2 target nucleic acids are NOT DETECTED.  The SARS-CoV-2 RNA is generally detectable in upper respiratory specimens during the acute phase of infection. The lowest concentration of SARS-CoV-2 viral copies this assay can detect is 138 copies/mL. A negative result does not preclude SARS-Cov-2 infection and should not be used as the sole basis for treatment or other patient management decisions. A negative result may occur with  improper specimen collection/handling, submission of specimen other than nasopharyngeal swab, presence of viral mutation(s) within the areas targeted by this assay, and inadequate number of viral copies(<138 copies/mL). A negative result must be combined with clinical observations, patient history, and epidemiological information. The expected result is Negative.  Fact Sheet for Patients:  03/13/22  Fact Sheet for Healthcare Providers:  BloggerCourse.com  This test is no t yet approved or cleared by the [place] FDA and  has been authorized for detection and/or diagnosis of SARS-CoV-2  by FDA under an Emergency Use Authorization (EUA). This EUA will remain  in effect (meaning this test can be used) for the duration of the COVID-19 declaration under Section 564(b)(1) of the Act, 21 U.S.C.section 360bbb-3(b)(1), unless the authorization is terminated  or revoked sooner.       Influenza A by PCR NEGATIVE NEGATIVE Final   Influenza B by PCR NEGATIVE NEGATIVE Final    Comment: (NOTE) The Xpert Xpress SARS-CoV-2/FLU/RSV plus assay is intended as an aid in the diagnosis of influenza from Nasopharyngeal swab specimens and should not be used as a sole basis for treatment. Nasal washings and aspirates are unacceptable for Xpert Xpress SARS-CoV-2/FLU/RSV testing.  Fact Sheet for  Patients: BloggerCourse.com  Fact Sheet for Healthcare Providers: [employer]ious[employer]ker.it  This test is not yet approved or cleared by the [place] FDA and has been authorized for detection and/or diagnosis of SARS-CoV-2 by FDA under an Emergency Use Authorization (EUA). This EUA will remain in effect (meaning this test can be used) for the duration of the COVID-19 declaration under Section 564(b)(1) of the Act, 21 U.S.C. section 360bbb-3(b)(1), unless the authorization is terminated or revoked.     Resp Syncytial Virus by PCR NEGATIVE NEGATIVE Final    Comment: (NOTE) Fact Sheet for Patients: BloggerCourse.com  Fact Sheet for Healthcare Providers: [employer]it  This test is not yet approved or cleared by the [place] FDA and has been authorized for detection and/or diagnosis of SARS-CoV-2 by FDA under an Emergency Use Authorization (EUA). This EUA will remain in effect (meaning this test can be used) for the duration of the COVID-19 declaration under Section 564(b)(1) of the Act, 21 U.S.C. section 360bbb-3(b)(1), unless the authorization is terminated or revoked.  Performed at [facility], [address] [place], [place] [address]          Radiology Studies: ECHOCARDIOGRAM COMPLETE  Result Date: 03/12/2022    ECHOCARDIOGRAM REPORT   Patient Name:   Parry Po Date of Exam: 03/12/2022 Medical Rec #:  [id number]      Height:       72.0 in Accession #:    [id number]     Weight:       340.0 lb Date of Birth:  08-22-1960      BSA:          2.670 m Patient Age:    61 years       BP:           127/87 mmHg Patient Gender: M              HR:           93 bpm. Exam Location:  Inpatient Procedure: 2D Echo, Cardiac Doppler, Color Doppler and Intracardiac            Opacification Agent Indications:    Pulmonary Embolus I26.09  History:        Patient has no prior  history of Echocardiogram examinations.                 PAD, Arrythmias:Tachycardia, Signs/Symptoms:Dyspnea; Risk                 Factors:Hypertension, Sleep Apnea, Diabetes and Dyslipidemia.  Sonographer:    Lucendia Herrlich Referring Phys: Lyda Perone, M  Sonographer Comments: Technically difficult study due to poor echo windows, suboptimal apical window and patient is obese. IMPRESSIONS  1. Septal hypokinesis . Left ventricular ejection fraction, by estimation, is 50 to 55%. The left ventricle  has low normal function. The left ventricle has no regional wall motion abnormalities. Left ventricular diastolic parameters were normal.  2. Right ventricular systolic function is mildly reduced. The right ventricular size is mildly enlarged.  3. The mitral valve is abnormal. No evidence of mitral valve regurgitation. No evidence of mitral stenosis.  4. The aortic valve is tricuspid. There is mild calcification of the aortic valve. There is mild thickening of the aortic valve. Aortic valve regurgitation is not visualized. Aortic valve sclerosis is present, with no evidence of aortic valve stenosis.  5. The inferior vena cava is normal in size with greater than 50% respiratory variability, suggesting right atrial pressure of 3 mmHg. FINDINGS  Left Ventricle: Septal hypokinesis. Left ventricular ejection fraction, by estimation, is 50 to 55%. The left ventricle has low normal function. The left ventricle has no regional wall motion abnormalities. Definity contrast agent was given IV to delineate the left ventricular endocardial borders. The left ventricular internal cavity size was normal in size. There is no left ventricular hypertrophy. Left ventricular diastolic parameters were normal. Right Ventricle: The right ventricular size is mildly enlarged. No increase in right ventricular wall thickness. Right ventricular systolic function is mildly reduced. Left Atrium: Left atrial size was normal in size. Right Atrium: Right  atrial size was normal in size. Pericardium: There is no evidence of pericardial effusion. Mitral Valve: The mitral valve is abnormal. There is mild thickening of the mitral valve leaflet(s). There is moderate calcification of the mitral valve leaflet(s). No evidence of mitral valve regurgitation. No evidence of mitral valve stenosis. Tricuspid Valve: The tricuspid valve is normal in structure. Tricuspid valve regurgitation is trivial. No evidence of tricuspid stenosis. Aortic Valve: The aortic valve is tricuspid. There is mild calcification of the aortic valve. There is mild thickening of the aortic valve. Aortic valve regurgitation is not visualized. Aortic valve sclerosis is present, with no evidence of aortic valve stenosis. Aortic valve mean gradient measures 2.0 mmHg. Aortic valve peak gradient measures 4.5 mmHg. Aortic valve area, by VTI measures 3.08 cm. Pulmonic Valve: The pulmonic valve was normal in structure. Pulmonic valve regurgitation is not visualized. No evidence of pulmonic stenosis. Aorta: The aortic root is normal in size and structure. Venous: The inferior vena cava is normal in size with greater than 50% respiratory variability, suggesting right atrial pressure of 3 mmHg. IAS/Shunts: No atrial level shunt detected by color flow Doppler.  LEFT VENTRICLE PLAX 2D LVIDd:         5.00 cm     Diastology LVIDs:         3.30 cm     LV e' medial:    7.07 cm/s LV PW:         1.20 cm     LV E/e' medial:  8.0 LV IVS:        1.10 cm     LV e' lateral:   6.20 cm/s LVOT diam:     2.30 cm     LV E/e' lateral: 9.1 LV SV:         54 LV SV Index:   20 LVOT Area:     4.15 cm  LV Volumes (MOD) LV vol d, MOD A2C: 91.6 ml LV vol d, MOD A4C: 80.8 ml LV vol s, MOD A2C: 51.0 ml LV vol s, MOD A4C: 51.4 ml LV SV MOD A2C:     40.6 ml LV SV MOD A4C:     80.8 ml LV SV MOD BP:  35.7 ml RIGHT VENTRICLE TAPSE (M-mode): 1.6 cm LEFT ATRIUM             Index LA diam:        3.40 cm 1.27 cm/m LA Vol (A2C):   34.7 ml 13.00  ml/m LA Vol (A4C):   22.3 ml 8.35 ml/m LA Biplane Vol: 29.4 ml 11.01 ml/m  AORTIC VALVE AV Area (Vmax):    2.93 cm AV Area (Vmean):   2.96 cm AV Area (VTI):     3.08 cm AV Vmax:           106.00 cm/s AV Vmean:          69.000 cm/s AV VTI:            0.176 m AV Peak Grad:      4.5 mmHg AV Mean Grad:      2.0 mmHg LVOT Vmax:         74.65 cm/s LVOT Vmean:        49.100 cm/s LVOT VTI:          0.130 m LVOT/AV VTI ratio: 0.74  AORTA Ao Root diam: 3.30 cm Ao Asc diam:  3.20 cm MITRAL VALVE MV Area (PHT): 3.74 cm    SHUNTS MV Decel Time: 203 msec    Systemic VTI:  0.13 m MV E velocity: 56.30 cm/s  Systemic Diam: 2.30 cm MV A velocity: 68.60 cm/s MV E/A ratio:  0.82 Charlton Haws MD Electronically signed by Charlton Haws MD Signature Date/Time: 03/12/2022/3:59:48 PM    Final    VAS [place] LOWER EXTREMITY VENOUS (DVT)  Result Date: 03/12/2022  Lower Venous DVT Study Patient Name:  [facility]  Date of Exam:   03/12/2022 Medical Rec #: [id number]       Accession #:    [id number] Date of Birth: Jul 01, 1960       Patient Gender: M Patient Age:   56 years Exam Location:  [facility] Procedure:      VAS [place] LOWER EXTREMITY VENOUS (DVT) Referring Phys: Lyda Perone --------------------------------------------------------------------------------  Indications: Pulmonary embolism.  Comparison Study: Prior negative right LEV done 07/13/20 Performing Technologist: Sherren Kerns RVS  Examination Guidelines: A complete evaluation includes B-mode imaging, spectral Doppler, color Doppler, and power Doppler as needed of all accessible portions of each vessel. Bilateral testing is considered an integral part of a complete examination. Limited examinations for reoccurring indications may be performed as noted. The reflux portion of the exam is performed with the patient in reverse Trendelenburg.  +---------+---------------+---------+-----------+---------------+-------------+ RIGHT     CompressibilityPhasicitySpontaneityProperties     Thrombus                                                                 Aging         +---------+---------------+---------+-----------+---------------+-------------+ CFV      Full                               pulsatile  waveforms                    +---------+---------------+---------+-----------+---------------+-------------+ SFJ      Full                                                            +---------+---------------+---------+-----------+---------------+-------------+ FV Prox  Full                                                            +---------+---------------+---------+-----------+---------------+-------------+ FV Mid   Full                                                            +---------+---------------+---------+-----------+---------------+-------------+ FV DistalFull                                                            +---------+---------------+---------+-----------+---------------+-------------+ PFV      Full                                                            +---------+---------------+---------+-----------+---------------+-------------+ POP      Full                               pulsatile                                                                waveforms                    +---------+---------------+---------+-----------+---------------+-------------+ PTV      Full                                                            +---------+---------------+---------+-----------+---------------+-------------+ PERO     Full                                                            +---------+---------------+---------+-----------+---------------+-------------+  Soleal   Full                                                             +---------+---------------+---------+-----------+---------------+-------------+ Gastroc  Full                                                            +---------+---------------+---------+-----------+---------------+-------------+   +---------+---------------+---------+-----------+---------------+-------------+ LEFT     CompressibilityPhasicitySpontaneityProperties     Thrombus                                                                 Aging         +---------+---------------+---------+-----------+---------------+-------------+ CFV      Full                               pulsatile                                                                waveforms                    +---------+---------------+---------+-----------+---------------+-------------+ SFJ      Full                                                            +---------+---------------+---------+-----------+---------------+-------------+ FV Prox  Full                                                            +---------+---------------+---------+-----------+---------------+-------------+ FV Mid   Full                                                            +---------+---------------+---------+-----------+---------------+-------------+ FV DistalPartial                                           Acute         +---------+---------------+---------+-----------+---------------+-------------+ PFV      Full                                                            +---------+---------------+---------+-----------+---------------+-------------+  POP      Partial                            pulsatile      Acute                                                     waveforms                    +---------+---------------+---------+-----------+---------------+-------------+ PTV      Full                                                             +---------+---------------+---------+-----------+---------------+-------------+ PERO     Full                                                            +---------+---------------+---------+-----------+---------------+-------------+ Soleal   None                                              Acute         +---------+---------------+---------+-----------+---------------+-------------+ Gastroc  Full                                                            +---------+---------------+---------+-----------+---------------+-------------+     Summary: RIGHT: - There is no evidence of deep vein thrombosis in the lower extremity.  - No cystic structure found in the popliteal fossa.   *See table(s) above for measurements and observations. Electronically signed by Waverly Ferrari MD on 03/12/2022 at 2:06:44 PM.    Final    CT Angio Chest Pulmonary Embolism (PE) W or WO Contrast  Result Date: 03/11/2022 CLINICAL DATA:  Pulmonary embolism (PE) suspected, high prob Pt presents with reports of lightheadedness and near syncope that started today while at work. EXAM: CT ANGIOGRAPHY CHEST WITH CONTRAST TECHNIQUE: Multidetector CT imaging of the chest was performed using the standard protocol during bolus administration of intravenous contrast. Multiplanar CT image reconstructions and MIPs were obtained to evaluate the vascular anatomy. RADIATION DOSE REDUCTION: This exam was performed according to the departmental dose-optimization program which includes automated exposure control, adjustment of the mA and/or kV according to patient size and/or use of iterative reconstruction technique. CONTRAST:  99mL OMNIPAQUE IOHEXOL 350 MG/ML SOLN COMPARISON:  Chest XR, 03/11/2022 FINDINGS: Cardiovascular: *Satisfactory opacification of the pulmonary arteries to the segmental level. *Saddle pulmonary embolus with additional emboli extending into the inferior and superior lobar arteries. *CT evidence of RIGHT heart  strain, with RV/LV ratio of 1.4. Additional leftward septal buckling. See key image. *Normal heart size. No pericardial effusion.  Mediastinum/Nodes: No enlarged mediastinal, hilar, or axillary lymph nodes. Thyroid gland, trachea, and esophagus demonstrate no significant findings. Lungs/Pleura: Lungs are clear without focal consolidation, mass or suspicious pulmonary nodule. No pleural effusion or pneumothorax. Upper Abdomen: No acute abnormality. Postsurgical changes of Roux-en-Y gastric bypass. Musculoskeletal: No acute chest wall abnormality. No significant osseous findings. Review of the MIP images confirms the above findings. IMPRESSION: Examination is POSITIVE for acute saddle-type pulmonary emboli, with CT evidence of right heart strain (RV/LV Ratio = 1.4). This is consistent with at least submassive (intermediate risk) PE. The presence of right heart strain has been associated with an increased risk of morbidity and mortality. Please refer to the "Code PE Focused" order set in EPIC. These results will be called to the ordering clinician or representative by the Radiologist Assistant, and communication documented in the PACS or [employer]. Electronically Signed   By: Roanna Banning M.D.   On: 03/11/2022 18:00   DG Chest 2 View  Result Date: 03/11/2022 CLINICAL DATA:  Shortness of breath. EXAM: CHEST - 2 VIEW COMPARISON:  February 19, 2015. FINDINGS: The heart size and mediastinal contours are within normal limits. Both lungs are clear. The visualized skeletal structures are unremarkable. IMPRESSION: No active cardiopulmonary disease. Electronically Signed   By: Lupita Raider M.D.   On: 03/11/2022 14:54        Scheduled Meds:  insulin aspart  0-15 Units Subcutaneous TID WC   insulin aspart  0-5 Units Subcutaneous QHS   multivitamin with minerals  1 tablet Oral BID   Continuous Infusions:  heparin 2,150 Units/hr (03/13/22 0728)     LOS: 2 days    Time spent: 44  Minutes    Berton Mount, MD  [facility] Pager #: [phone] 7PM-7AM contact night coverage as above

## 2022-03-13 NOTE — Progress Notes (Signed)
ANTICOAGULATION CONSULT NOTE - Follow Up Consult  Pharmacy Consult for heparin Indication: pulmonary embolus  Labs: Recent Labs    03/11/22 1616 03/11/22 1800 03/12/22 0105 03/12/22 0650 03/12/22 1134 03/12/22 2020 03/13/22 0542  HGB 14.0  --   --  13.3  --   --  12.8*  HCT 43.8  --   --  41.7  --   --  40.5  PLT 203  --   --  205  --   --  208  HEPARINUNFRC  --   --    < >  --  0.16* 0.26* 0.42  CREATININE 1.18  --   --  1.19  --   --   --   TROPONINIHS 134* 211*  --   --   --   --   --    < > = values in this interval not displayed.    Assessment/Plan:  61yo male therapeutic on heparin after rate change. Will continue infusion at current rate of 2150 units/hr and confirm stable with additional level.   Vernard Gambles, PharmD, BCPS  03/13/2022,7:13 AM

## 2022-03-14 ENCOUNTER — Telehealth: Payer: Self-pay | Admitting: Pulmonary Disease

## 2022-03-14 DIAGNOSIS — I2602 Saddle embolus of pulmonary artery with acute cor pulmonale: Secondary | ICD-10-CM | POA: Diagnosis not present

## 2022-03-14 LAB — GLUCOSE, CAPILLARY
Glucose-Capillary: 104 mg/dL — ABNORMAL HIGH (ref 70–99)
Glucose-Capillary: 165 mg/dL — ABNORMAL HIGH (ref 70–99)

## 2022-03-14 LAB — CBC
HCT: 40.1 % (ref 39.0–52.0)
Hemoglobin: 12.7 g/dL — ABNORMAL LOW (ref 13.0–17.0)
MCH: 28.9 pg (ref 26.0–34.0)
MCHC: 31.7 g/dL (ref 30.0–36.0)
MCV: 91.3 fL (ref 80.0–100.0)
Platelets: 205 10*3/uL (ref 150–400)
RBC: 4.39 MIL/uL (ref 4.22–5.81)
RDW: 14.1 % (ref 11.5–15.5)
WBC: 6.6 10*3/uL (ref 4.0–10.5)
nRBC: 0 % (ref 0.0–0.2)

## 2022-03-14 MED ORDER — APIXABAN 5 MG PO TABS: ORAL_TABLET | ORAL | 1 refills | Status: DC

## 2022-03-14 NOTE — Progress Notes (Incomplete)
PROGRESS NOTE    Jeffrey Molina  [id number] DOB: 06/26/1960 DOA: 03/11/2022 PCP: Mosie Lukes, MD  Outpatient Specialists:     Brief Narrative:  As per HPI done on Admission by the ICU team:"Jeffrey Molina is an 61 y.o. M who presented to [facility] with a chief complaint of shortness of breath   They have a pertinent past medical history of HTN, DMII, OSA, GERD, PAD   Reports of fatigue over the week. Patient with lightheadedness and near syncope at work on 12/22 with continued SOB on exertion.    Patient with O2 sat of 97% on room air. CTA chest demonstrated submassive saddle PE. No documented hypotension or tachycardia. Troponin 134".  03/13/2022: Admitted with submassive pulmonary embolism on left lower extremity DVT.  Patient is currently on heparin drip.  Pulmonary/critical care input is appreciated..   Assessment & Plan:   Principal Problem:   Acute saddle pulmonary embolism with acute cor pulmonale (HCC) Active Problems:   HTN (hypertension)   Morbid obesity (HCC)   OSA on CPAP   Diabetes mellitus type 2 in obese (HCC)   Submassive Pulmonary embolism LLE DVT S-PESI: 0, no hypotension, no tachycardia. RV to LV ratio 1.4. Saddle PE with additional emboli extending into the inferior and superior lobar arteries. Denies syncope. Only pre-syncope. Patient denies risk factors for PE. No family hx. No cancer. No immobility. No recent trips. Obese. Seems unprovoked. Patient is open to cathether directed thrombolysis if symptoms worsen but would like conservative management at this time with heparin GTT. P: -Continuous telemetry and SPO2 monitoring -Continue Heparin drip -Transition to DOAC in 1-2 days (May need to continue Heparin/DOAC for a couple of days). -Patient is morbidly obese -Will follow up with PCP and Hematology team on DC.  Patient will likely need further work up. -LE dopplers with LLE DVT -ECHO without significant RH strain   Troponin Elevation, suspect  secondary to demand -Continuous telemetry -Trend   HTN -Hold home antihypertensives   OSA -continue CPAP qhs   DMII -Blood Glucose goal 140-180. -SSI   GERD -PPI     DVT prophylaxis: Heparin drip Code Status: Full Family Communication: Wife Disposition Plan: Home   Consultants:  Pulm/Critical care  Procedures:  None  Antimicrobials:  None   Subjective: No new complaints No pleuritic chest pain  Objective: Vitals:   03/13/22 1543 03/13/22 2014 03/13/22 2337 03/14/22 0619  BP:  (!) 160/84  (!) 142/82  Pulse:  95  65  Resp: 18 18  18   Temp: 98 F (36.7 C) 98.6 F (37 C)  97.8 F (36.6 C)  TempSrc: Oral Oral  Oral  SpO2:  96% 100% 100%  Weight:    (!) 153.2 kg  Height:        Intake/Output Summary (Last 24 hours) at 03/14/2022 0758 Last data filed at 03/13/2022 2151 Gross per 24 hour  Intake 640 ml  Output --  Net 640 ml    Filed Weights   03/11/22 1435 03/12/22 1046 03/14/22 0619  Weight: (!) 154.2 kg (!) 152.1 kg (!) 153.2 kg    Examination:  General exam: Appears calm and comfortable.  Patient is morbidly obese Respiratory system: Clear to auscultation.   Cardiovascular system: S1 & S2 heard Gastrointestinal system: Abdomen is morbidly obese  Central nervous system: Alert and oriented. No focal neurological deficits.   Data Reviewed: I have personally reviewed following labs and imaging studies  CBC: Recent Labs  Lab 03/11/22 1616 03/12/22 0650 03/13/22 0542 03/14/22  0553  WBC 10.1 7.6 6.5 6.6  NEUTROABS 7.9*  --   --   --   HGB 14.0 13.3 12.8* 12.7*  HCT 43.8 41.7 40.5 40.1  MCV 90.5 92.1 91.8 91.3  PLT 203 205 208 205    Basic Metabolic Panel: Recent Labs  Lab 03/11/22 1616 03/12/22 0650  NA 139 143  K 3.6 4.3  CL 106 110  CO2 26 25  GLUCOSE 126* 120*  BUN 16 12  CREATININE 1.18 1.19  CALCIUM 8.4* 8.5*    GFR: Estimated Creatinine Clearance: 101.9 mL/min (by C-G formula based on SCr of 1.19 mg/dL). Liver  Function Tests: Recent Labs  Lab 03/11/22 1616  AST 63*  ALT 63*  ALKPHOS 66  BILITOT 0.6  PROT 7.5  ALBUMIN 3.6    Recent Labs  Lab 03/11/22 1616  LIPASE 48    No results for input(s): "AMMONIA" in the last 168 hours. Coagulation Profile: No results for input(s): "INR", "PROTIME" in the last 168 hours. Cardiac Enzymes: No results for input(s): "CKTOTAL", "CKMB", "CKMBINDEX", "TROPONINI" in the last 168 hours. BNP (last 3 results) No results for input(s): "PROBNP" in the last 8760 hours. HbA1C: No results for input(s): "HGBA1C" in the last 72 hours. CBG: Recent Labs  Lab 03/12/22 2045 03/13/22 0743 03/13/22 1135 03/13/22 1605 03/13/22 2128  GLUCAP 103* 116* 107* 118* 121*    Lipid Profile: No results for input(s): "CHOL", "HDL", "LDLCALC", "TRIG", "CHOLHDL", "LDLDIRECT" in the last 72 hours. Thyroid Function Tests: No results for input(s): "TSH", "T4TOTAL", "FREET4", "T3FREE", "THYROIDAB" in the last 72 hours. Anemia Panel: No results for input(s): "VITAMINB12", "FOLATE", "FERRITIN", "TIBC", "IRON", "RETICCTPCT" in the last 72 hours. Urine analysis:    Component Value Date/Time   COLORURINE YELLOW 11/07/2014 1218   APPEARANCEUR CLOUDY (A) 11/07/2014 1218   LABSPEC >=1.030 (A) 11/07/2014 1218   PHURINE 5.5 11/07/2014 1218   GLUCOSEU NEGATIVE 11/07/2014 1218   HGBUR NEGATIVE 11/07/2014 1218   BILIRUBINUR small (A) 09/10/2020 1610   KETONESUR moderate (40) (A) 09/10/2020 1610   KETONESUR TRACE (A) 11/07/2014 1218   UROBILINOGEN 1.0 09/10/2020 1610   UROBILINOGEN 0.2 11/07/2014 1218   NITRITE Negative 09/10/2020 1610   NITRITE NEGATIVE 11/07/2014 1218   LEUKOCYTESUR Moderate (2+) (A) 09/10/2020 1610   Sepsis Labs: @LABRCNTIP (procalcitonin:4,lacticidven:4)  ) Recent Results (from the past 240 hour(s))  Resp panel by RT-PCR (RSV, Flu A&B, Covid) Anterior Nasal Swab     Status: None   Collection Time: 03/11/22  2:40 PM   Specimen: Anterior Nasal Swab   Result Value Ref Range Status   SARS Coronavirus 2 by RT PCR NEGATIVE NEGATIVE Final    Comment: (NOTE) SARS-CoV-2 target nucleic acids are NOT DETECTED.  The SARS-CoV-2 RNA is generally detectable in upper respiratory specimens during the acute phase of infection. The lowest concentration of SARS-CoV-2 viral copies this assay can detect is 138 copies/mL. A negative result does not preclude SARS-Cov-2 infection and should not be used as the sole basis for treatment or other patient management decisions. A negative result may occur with  improper specimen collection/handling, submission of specimen other than nasopharyngeal swab, presence of viral mutation(s) within the areas targeted by this assay, and inadequate number of viral copies(<138 copies/mL). A negative result must be combined with clinical observations, patient history, and epidemiological information. The expected result is Negative.  Fact Sheet for Patients:  03/13/22  Fact Sheet for Healthcare Providers:  BloggerCourse.com  This test is no t yet approved or cleared by  the [employer] and  has been authorized for detection and/or diagnosis of SARS-CoV-2 by FDA under an Emergency Use Authorization (EUA). This EUA will remain  in effect (meaning this test can be used) for the duration of the COVID-19 declaration under Section 564(b)(1) of the Act, 21 U.S.C.section 360bbb-3(b)(1), unless the authorization is terminated  or revoked sooner.       Influenza A by PCR NEGATIVE NEGATIVE Final   Influenza B by PCR NEGATIVE NEGATIVE Final    Comment: (NOTE) The Xpert Xpress SARS-CoV-2/FLU/RSV plus assay is intended as an aid in the diagnosis of influenza from Nasopharyngeal swab specimens and should not be used as a sole basis for treatment. Nasal washings and aspirates are unacceptable for Xpert Xpress SARS-CoV-2/FLU/RSV testing.  Fact Sheet for  Patients: BloggerCourse.com  Fact Sheet for Healthcare Providers: SeriousBroker.it  This test is not yet approved or cleared by the [place] FDA and has been authorized for detection and/or diagnosis of SARS-CoV-2 by FDA under an Emergency Use Authorization (EUA). This EUA will remain in effect (meaning this test can be used) for the duration of the COVID-19 declaration under Section 564(b)(1) of the Act, 21 U.S.C. section 360bbb-3(b)(1), unless the authorization is terminated or revoked.     Resp Syncytial Virus by PCR NEGATIVE NEGATIVE Final    Comment: (NOTE) Fact Sheet for Patients: BloggerCourse.com  Fact Sheet for Healthcare Providers: SeriousBroker.it  This test is not yet approved or cleared by the [place] FDA and has been authorized for detection and/or diagnosis of SARS-CoV-2 by FDA under an Emergency Use Authorization (EUA). This EUA will remain in effect (meaning this test can be used) for the duration of the COVID-19 declaration under Section 564(b)(1) of the Act, 21 U.S.C. section 360bbb-3(b)(1), unless the authorization is terminated or revoked.  Performed at [facility], [address] [place], [place] [address]          Radiology Studies: ECHOCARDIOGRAM COMPLETE  Result Date: 03/12/2022    ECHOCARDIOGRAM REPORT   Patient Name:   Jeffrey Molina Date of Exam: 03/12/2022 Medical Rec #:  [id number]      Height:       72.0 in Accession #:    [id number]     Weight:       340.0 lb Date of Birth:  09/13/1960      BSA:          2.670 m Patient Age:    61 years       BP:           127/87 mmHg Patient Gender: M              HR:           93 bpm. Exam Location:  Inpatient Procedure: 2D Echo, Cardiac Doppler, Color Doppler and Intracardiac            Opacification Agent Indications:    Pulmonary Embolus I26.09  History:        Patient has no prior  history of Echocardiogram examinations.                 PAD, Arrythmias:Tachycardia, Signs/Symptoms:Dyspnea; Risk                 Factors:Hypertension, Sleep Apnea, Diabetes and Dyslipidemia.  Sonographer:    Lucendia Herrlich Referring Phys: Lyda Perone, M  Sonographer Comments: Technically difficult study due to poor echo windows, suboptimal apical window and patient is obese. IMPRESSIONS  1. Septal  hypokinesis . Left ventricular ejection fraction, by estimation, is 50 to 55%. The left ventricle has low normal function. The left ventricle has no regional wall motion abnormalities. Left ventricular diastolic parameters were normal.  2. Right ventricular systolic function is mildly reduced. The right ventricular size is mildly enlarged.  3. The mitral valve is abnormal. No evidence of mitral valve regurgitation. No evidence of mitral stenosis.  4. The aortic valve is tricuspid. There is mild calcification of the aortic valve. There is mild thickening of the aortic valve. Aortic valve regurgitation is not visualized. Aortic valve sclerosis is present, with no evidence of aortic valve stenosis.  5. The inferior vena cava is normal in size with greater than 50% respiratory variability, suggesting right atrial pressure of 3 mmHg. FINDINGS  Left Ventricle: Septal hypokinesis. Left ventricular ejection fraction, by estimation, is 50 to 55%. The left ventricle has low normal function. The left ventricle has no regional wall motion abnormalities. Definity contrast agent was given IV to delineate the left ventricular endocardial borders. The left ventricular internal cavity size was normal in size. There is no left ventricular hypertrophy. Left ventricular diastolic parameters were normal. Right Ventricle: The right ventricular size is mildly enlarged. No increase in right ventricular wall thickness. Right ventricular systolic function is mildly reduced. Left Atrium: Left atrial size was normal in size. Right Atrium: Right  atrial size was normal in size. Pericardium: There is no evidence of pericardial effusion. Mitral Valve: The mitral valve is abnormal. There is mild thickening of the mitral valve leaflet(s). There is moderate calcification of the mitral valve leaflet(s). No evidence of mitral valve regurgitation. No evidence of mitral valve stenosis. Tricuspid Valve: The tricuspid valve is normal in structure. Tricuspid valve regurgitation is trivial. No evidence of tricuspid stenosis. Aortic Valve: The aortic valve is tricuspid. There is mild calcification of the aortic valve. There is mild thickening of the aortic valve. Aortic valve regurgitation is not visualized. Aortic valve sclerosis is present, with no evidence of aortic valve stenosis. Aortic valve mean gradient measures 2.0 mmHg. Aortic valve peak gradient measures 4.5 mmHg. Aortic valve area, by VTI measures 3.08 cm. Pulmonic Valve: The pulmonic valve was normal in structure. Pulmonic valve regurgitation is not visualized. No evidence of pulmonic stenosis. Aorta: The aortic root is normal in size and structure. Venous: The inferior vena cava is normal in size with greater than 50% respiratory variability, suggesting right atrial pressure of 3 mmHg. IAS/Shunts: No atrial level shunt detected by color flow Doppler.  LEFT VENTRICLE PLAX 2D LVIDd:         5.00 cm     Diastology LVIDs:         3.30 cm     LV e' medial:    7.07 cm/s LV PW:         1.20 cm     LV E/e' medial:  8.0 LV IVS:        1.10 cm     LV e' lateral:   6.20 cm/s LVOT diam:     2.30 cm     LV E/e' lateral: 9.1 LV SV:         54 LV SV Index:   20 LVOT Area:     4.15 cm  LV Volumes (MOD) LV vol d, MOD A2C: 91.6 ml LV vol d, MOD A4C: 80.8 ml LV vol s, MOD A2C: 51.0 ml LV vol s, MOD A4C: 51.4 ml LV SV MOD A2C:     40.6 ml  LV SV MOD A4C:     80.8 ml LV SV MOD BP:      35.7 ml RIGHT VENTRICLE TAPSE (M-mode): 1.6 cm LEFT ATRIUM             Index LA diam:        3.40 cm 1.27 cm/m LA Vol (A2C):   34.7 ml 13.00  ml/m LA Vol (A4C):   22.3 ml 8.35 ml/m LA Biplane Vol: 29.4 ml 11.01 ml/m  AORTIC VALVE AV Area (Vmax):    2.93 cm AV Area (Vmean):   2.96 cm AV Area (VTI):     3.08 cm AV Vmax:           106.00 cm/s AV Vmean:          69.000 cm/s AV VTI:            0.176 m AV Peak Grad:      4.5 mmHg AV Mean Grad:      2.0 mmHg LVOT Vmax:         74.65 cm/s LVOT Vmean:        49.100 cm/s LVOT VTI:          0.130 m LVOT/AV VTI ratio: 0.74  AORTA Ao Root diam: 3.30 cm Ao Asc diam:  3.20 cm MITRAL VALVE MV Area (PHT): 3.74 cm    SHUNTS MV Decel Time: 203 msec    Systemic VTI:  0.13 m MV E velocity: 56.30 cm/s  Systemic Diam: 2.30 cm MV A velocity: 68.60 cm/s MV E/A ratio:  0.82 Jenkins Rouge MD Electronically signed by Jenkins Rouge MD Signature Date/Time: 03/12/2022/3:59:48 PM    Final    VAS [place] LOWER EXTREMITY VENOUS (DVT)  Result Date: 03/12/2022  Lower Venous DVT Study Patient Name:  [facility]  Date of Exam:   03/12/2022 Medical Rec #: [id number]       Accession #:    [id number] Date of Birth: Dec 01, 1960       Patient Gender: M Patient Age:   36 years Exam Location:  [facility] Procedure:      VAS [place] LOWER EXTREMITY VENOUS (DVT) Referring Phys: Jennette Kettle --------------------------------------------------------------------------------  Indications: Pulmonary embolism.  Comparison Study: Prior negative right LEV done 07/13/20 Performing Technologist: Sharion Dove RVS  Examination Guidelines: A complete evaluation includes B-mode imaging, spectral Doppler, color Doppler, and power Doppler as needed of all accessible portions of each vessel. Bilateral testing is considered an integral part of a complete examination. Limited examinations for reoccurring indications may be performed as noted. The reflux portion of the exam is performed with the patient in reverse Trendelenburg.  +---------+---------------+---------+-----------+---------------+-------------+ RIGHT     CompressibilityPhasicitySpontaneityProperties     Thrombus                                                                 Aging         +---------+---------------+---------+-----------+---------------+-------------+ CFV      Full                               pulsatile  waveforms                    +---------+---------------+---------+-----------+---------------+-------------+ SFJ      Full                                                            +---------+---------------+---------+-----------+---------------+-------------+ FV Prox  Full                                                            +---------+---------------+---------+-----------+---------------+-------------+ FV Mid   Full                                                            +---------+---------------+---------+-----------+---------------+-------------+ FV DistalFull                                                            +---------+---------------+---------+-----------+---------------+-------------+ PFV      Full                                                            +---------+---------------+---------+-----------+---------------+-------------+ POP      Full                               pulsatile                                                                waveforms                    +---------+---------------+---------+-----------+---------------+-------------+ PTV      Full                                                            +---------+---------------+---------+-----------+---------------+-------------+ PERO     Full                                                            +---------+---------------+---------+-----------+---------------+-------------+  Soleal   Full                                                             +---------+---------------+---------+-----------+---------------+-------------+ Gastroc  Full                                                            +---------+---------------+---------+-----------+---------------+-------------+   +---------+---------------+---------+-----------+---------------+-------------+ LEFT     CompressibilityPhasicitySpontaneityProperties     Thrombus                                                                 Aging         +---------+---------------+---------+-----------+---------------+-------------+ CFV      Full                               pulsatile                                                                waveforms                    +---------+---------------+---------+-----------+---------------+-------------+ SFJ      Full                                                            +---------+---------------+---------+-----------+---------------+-------------+ FV Prox  Full                                                            +---------+---------------+---------+-----------+---------------+-------------+ FV Mid   Full                                                            +---------+---------------+---------+-----------+---------------+-------------+ FV DistalPartial                                           Acute         +---------+---------------+---------+-----------+---------------+-------------+ PFV      Full                                                            +---------+---------------+---------+-----------+---------------+-------------+  POP      Partial                            pulsatile      Acute                                                     waveforms                    +---------+---------------+---------+-----------+---------------+-------------+ PTV      Full                                                             +---------+---------------+---------+-----------+---------------+-------------+ PERO     Full                                                            +---------+---------------+---------+-----------+---------------+-------------+ Soleal   None                                              Acute         +---------+---------------+---------+-----------+---------------+-------------+ Gastroc  Full                                                            +---------+---------------+---------+-----------+---------------+-------------+     Summary: RIGHT: - There is no evidence of deep vein thrombosis in the lower extremity.  - No cystic structure found in the popliteal fossa.   *See table(s) above for measurements and observations. Electronically signed by Deitra Mayo MD on 03/12/2022 at 2:06:44 PM.    Final         Scheduled Meds:  apixaban  10 mg Oral BID   Followed by   Derrill Memo ON 03/20/2022] apixaban  5 mg Oral BID   insulin aspart  0-15 Units Subcutaneous TID WC   insulin aspart  0-5 Units Subcutaneous QHS   multivitamin with minerals  1 tablet Oral BID   Continuous Infusions:     LOS: 3 days    Time spent: 62 Minutes    Dana Allan, MD  [facility] Pager #: [phone] 7PM-7AM contact night coverage as above

## 2022-03-14 NOTE — Telephone Encounter (Signed)
Please schedule hospital follow up for pulmonary embolism and DVT in the next 2 weeks with myself or an APP.   Thank, JD

## 2022-03-14 NOTE — Discharge Summary (Signed)
Physician Discharge Summary  Patient ID: Jeffrey Molina MRN: [id number] DOB/AGE: May 16, 1960 61 y.o.  Admit date: 03/11/2022 Discharge date: 03/14/2022  Admission Diagnoses:  Discharge Diagnoses:  Principal Problem:   Acute saddle pulmonary embolism with acute cor pulmonale (HCC) Active Problems:   HTN (hypertension)   Morbid obesity (HCC)   OSA on CPAP   Diabetes mellitus type 2 in obese (HCC) Left lower extremity DVT.   Discharged Condition: stable  Hospital Course: Patient is a 61 year old male, morbidly obese, with past medical history significant for hypertension, diabetes mellitus type 2, OSA, GERD and peripheral artery disease.  Patient was admitted with submassive pulmonary embolism and left lower extremity DVT.  Patient was initially managed by the critical care team.  Patient was initially on heparin.  Heparin has been transitioned to Eliquis.  Pulmonary/critical care team has cleared patient for discharge.  Patient will follow-up with primary care provider and pulmonary and critical care team within 1 week of discharge.  Submassive Pulmonary embolism LLE DVT S-PESI: 0, no hypotension, no tachycardia. RV to LV ratio 1.4. Saddle PE with additional emboli extending into the inferior and superior lobar arteries. Denies syncope. Only pre-syncope. Patient denies risk factors for PE. No family hx. No cancer. No immobility. No recent trips. Obese. Seems unprovoked. Patient is open to cathether directed thrombolysis if symptoms worsen but would like conservative management at this time with heparin GTT. P: -Continuous telemetry and SPO2 monitoring -Initially on Heparin drip but transition to Eliquis.   -Patient is morbidly obese -Patient Will follow up with PCP and Hematology team on DC.  Patient will likely need further work up. -LE dopplers with LLE DVT -ECHO without significant RH strain   Troponin Elevation, suspect secondary to demand -Continuous telemetry -Trend    HTN -Hold home antihypertensives   OSA -continue CPAP qhs   DMII -Blood Glucose goal 140-180. -SSI   GERD -PPI    Consults: pulmonary/intensive care  Significant Diagnostic Studies:  -CTA chest revealed submassive pulmonary embolism. -Doppler venous ultrasound of lower extremities revealed DVT of left lower extremity.  Treatments: Anticoagulation  Discharge Exam: Blood pressure (!) 142/82, pulse 65, temperature 97.8 F (36.6 C), temperature source Oral, resp. rate 18, height 6' (1.829 m), weight (!) 153.2 kg, SpO2 100 %.   Disposition: Discharge disposition: 01-Home or Self Care       Discharge Instructions     Diet - low sodium heart healthy   Complete by: As directed    Increase activity slowly   Complete by: As directed       Allergies as of 03/14/2022       Reactions   Lisinopril-hydrochlorothiazide Swelling   Lip swollen        Medication List     STOP taking these medications    diclofenac 75 MG EC tablet Commonly known as: VOLTAREN       TAKE these medications    apixaban 5 MG Tabs tablet Commonly known as: ELIQUIS Eliquis 10 mg po bid for 1 week, then 5 mg po bid   B-12 5000 MCG Tbdp Take 1 tablet by mouth daily.   CALCIUM 600 + D PO Take 1 tablet by mouth daily.   metFORMIN 500 MG tablet Commonly known as: GLUCOPHAGE Take 1 tablet (500 mg total) by mouth daily with breakfast.   metoprolol tartrate 25 MG tablet Commonly known as: LOPRESSOR TAKE ONE-HALF (1/2) TABLET TWICE A DAY What changed: See the new instructions.   multivitamin with minerals Tabs tablet Take 1  tablet by mouth 2 (two) times daily.   PROBIOTIC & ACIDOPHILUS EX ST PO Take 1 tablet by mouth daily.   sildenafil 50 MG tablet Commonly known as: Viagra Take 1 tablet (50 mg total) by mouth daily as needed for erectile dysfunction.         SignedBarnetta Chapel 03/14/2022, 1:56 PM

## 2022-03-14 NOTE — Progress Notes (Signed)
NAME:  Jeffrey Molina, MRN:  [id number], DOB:  Jul 15, 1960, LOS: 3 ADMISSION DATE:  03/11/2022, CONSULTATION DATE:  12/22 REFERRING MD:  Doran Durand, CHIEF COMPLAINT:  SOB   History of Present Illness:  Jeffrey Molina is an 61 y.o. M who presented to [facility] with a chief complaint of shortness of breath  They have a pertinent past medical history of HTN, DMII, OSA, GERD, PAD  Reports of fatigue over the week. Patient with lightheadedness and near syncope at work on 12/22 with continued SOB on exertion.   Patient with O2 sat of 97% on room air. CTA chest demonstrated submassive saddle PE. No documented hypotension or tachycardia. Troponin 134.  PCCM was called for admission. ED to ED transfer was initiated.   Pertinent  Medical History  HTN, DMII, OSA, GERD, PAD  Significant Hospital Events: Including procedures, antibiotic start and stop dates in addition to other pertinent events   12/22 Presented to [facility], CT chest: submassive PE, TXR to [facility]. Heparin started around 5-6pm.  Interim History / Subjective:   No acute events overnight Patient tolerated ambulation to the nursing station yesterday No complaints this morning Started on Eliquis last night  Objective   Blood pressure (!) 142/82, pulse 65, temperature 97.8 F (36.6 C), temperature source Oral, resp. rate 18, height 6' (1.829 m), weight (!) 153.2 kg, SpO2 100 %.        Intake/Output Summary (Last 24 hours) at 03/14/2022 0757 Last data filed at 03/13/2022 2151 Gross per 24 hour  Intake 640 ml  Output --  Net 640 ml   Filed Weights   03/11/22 1435 03/12/22 1046 03/14/22 0619  Weight: (!) 154.2 kg (!) 152.1 kg (!) 153.2 kg    Examination: General: In bed, NAD, appears comfortable, obese HEENT: MM pink/moist, anicteric, atraumatic Neuro: alert, oriented moving all extremities CV: S1S2, NSR, no m/r/g appreciated PULM: clear to auscultation bilaterally GI: soft, bsx4 active, non-tender   Extremities: warm/dry, trace  pretibial edema, capillary refill less than 3 seconds  Skin:  no rashes or lesions noted  TTE: LV EF 50-55%. RV systolic function is mildly reduced, RV mildly enlarged.   LE US - DVT of LLE  Resolved Hospital Problem list     Assessment & Plan:  Submassive Pulmonary embolism LLE DVT S-PESI: 0, no hypotension, no tachycardia. RV to LV ratio 1.4. Saddle PE with additional emboli extending into the inferior and superior lobar arteries. Denies syncope. Only pre-syncope. Patient denies risk factors for PE. No family hx. No cancer. No immobility. No recent trips. Obese. Seems unprovoked. Patient is open to cathether directed thrombolysis if symptoms worsen but would like conservative management at this time with heparin GTT. P: -Continue eliquis, will plan for life long anticoagulation and consider outpatient hematology workup in the future.  -LE dopplers with LLE DVT -ECHO without significant RH strain  Troponin Elevation, suspect secondary to demand -Continuous telemetry -Trend  HTN -Hold home antihypertensives  OSA -continue CPAP qhs  DMII -Blood Glucose goal 140-180. -SSI  GERD -PPI  Disposition: Patient stable for discharge per pulmonary perspective. Recommend limited ambulation over the next 2 weeks and resuming normal activity levels after 1 month. We will arrange follow up in pulmonary clinic in 2 weeks.   PCCM will sign off  Best Practice (right click and "Reselect all SmartList Selections" daily)   Per primary  Labs   CBC: Recent Labs  Lab 03/11/22 1616 03/12/22 0650 03/13/22 0542 03/14/22 0553  WBC 10.1 7.6 6.5 6.6  NEUTROABS  7.9*  --   --   --   HGB 14.0 13.3 12.8* 12.7*  HCT 43.8 41.7 40.5 40.1  MCV 90.5 92.1 91.8 91.3  PLT 203 205 208 [id number]    Basic Metabolic Panel: Recent Labs  Lab 03/11/22 1616 03/12/22 0650  NA 139 143  K 3.6 4.3  CL 106 110  CO2 26 25  GLUCOSE 126* 120*  BUN 16 12  CREATININE 1.18 1.19  CALCIUM 8.4* 8.5*    GFR: Estimated Creatinine Clearance: 101.9 mL/min (by C-G formula based on SCr of 1.19 mg/dL). Recent Labs  Lab 03/11/22 1616 03/12/22 0650 03/13/22 0542 03/14/22 0553  WBC 10.1 7.6 6.5 6.6    Liver Function Tests: Recent Labs  Lab 03/11/22 1616  AST 63*  ALT 63*  ALKPHOS 66  BILITOT 0.6  PROT 7.5  ALBUMIN 3.6   Recent Labs  Lab 03/11/22 1616  LIPASE 48   No results for input(s): "AMMONIA" in the last 168 hours.  ABG No results found for: "PHART", "PCO2ART", "PO2ART", "HCO3", "TCO2", "ACIDBASEDEF", "O2SAT"   Coagulation Profile: No results for input(s): "INR", "PROTIME" in the last 168 hours.  Cardiac Enzymes: No results for input(s): "CKTOTAL", "CKMB", "CKMBINDEX", "TROPONINI" in the last 168 hours.  HbA1C: Hgb A1c MFr Bld  Date/Time Value Ref Range Status  07/21/2020 07:52 AM 6.2 4.6 - 6.5 % Final    Comment:    Glycemic Control Guidelines for People with Diabetes:Non Diabetic:  <6%Goal of Therapy: <7%Additional Action Suggested:  >8%   10/25/2016 05:00 PM 5.7 4.6 - 6.5 % Final    Comment:    Glycemic Control Guidelines for People with Diabetes:Non Diabetic:  <6%Goal of Therapy: <7%Additional Action Suggested:  >8%     CBG: Recent Labs  Lab 03/12/22 2045 03/13/22 0743 03/13/22 1135 03/13/22 1605 03/13/22 2128  GLUCAP 103* 116* 107* 118* 121*       Critical care time: n/a     Freda Jackson, MD [facility] Office: [phone]   See Amion for personal pager PCCM on call pager [phone] until 7pm. Please call Elink 7p-7a. [phone]

## 2022-03-14 NOTE — TOC Transition Note (Signed)
Transition of Care [facility]) - CM/SW Discharge Note   Patient Details  Name: Jeffrey Molina MRN: [id number] Date of Birth: [birth year]  [facility] CM/SW Contact:  Tom-Johnson, Hershal Coria, RN Phone Number: 03/14/2022, 2:43 PM   Clinical Narrative:     Patient is scheduled for discharge today. Family to transport at discharge. No TOC needs or recommendations noted.      Final next level of care: Home/Self Care Barriers to Discharge: Barriers Resolved   Patient Goals and CMS Choice CMS Medicare.gov Compare Post Acute Care list provided to:: Patient Choice offered to / list presented to : NA  Discharge Placement                  Patient to be transferred to facility by: Family      Discharge Plan and Services Additional resources added to the After Visit Summary for                  DME Arranged: N/A DME Agency: NA       HH Arranged: NA HH Agency: NA        Social Determinants of Health (SDOH) Interventions SDOH Screenings   Depression (PHQ2-9): Low Risk  (09/10/2020)  Tobacco Use: Low Risk  (03/11/2022)     Readmission Risk Interventions     No data to display

## 2022-03-14 NOTE — Discharge Instructions (Addendum)
Information on my medicine - ELIQUIS (apixaban)  Why was Eliquis prescribed for you? Eliquis was prescribed to treat blood clots that may have been found in the veins of your legs (deep vein thrombosis) or in your lungs (pulmonary embolism) and to reduce the risk of them occurring again.  What do You need to know about Eliquis ? The starting dose is 10 mg (two 5 mg tablets) taken TWICE daily for the FIRST SEVEN (7) DAYS, then on (enter date)  03/20/22 you will take 10mg  by mouth in the morning, then in the evening you will switch to 5 mg and continue 5 mg twice daily afterwards.  Eliquis may be taken with or without food.   Try to take the dose about the same time in the morning and in the evening. If you have difficulty swallowing the tablet whole please discuss with your pharmacist how to take the medication safely.  Take Eliquis exactly as prescribed and DO NOT stop taking Eliquis without talking to the doctor who prescribed the medication.  Stopping may increase your risk of developing a new blood clot.  Refill your prescription before you run out.  After discharge, you should have regular check-up appointments with your healthcare provider that is prescribing your Eliquis.    What do you do if you miss a dose? If a dose of ELIQUIS is not taken at the scheduled time, take it as soon as possible on the same day and twice-daily administration should be resumed. The dose should not be doubled to make up for a missed dose.  Important Safety Information A possible side effect of Eliquis is bleeding. You should call your healthcare provider right away if you experience any of the following: Bleeding from an injury or your nose that does not stop. Unusual colored urine (red or dark brown) or unusual colored stools (red or black). Unusual bruising for unknown reasons. A serious fall or if you hit your head (even if there is no bleeding).  Some medicines may interact with Eliquis and  might increase your risk of bleeding or clotting while on Eliquis. To help avoid this, consult your healthcare provider or pharmacist prior to using any new prescription or non-prescription medications, including herbals, vitamins, non-steroidal anti-inflammatory drugs (NSAIDs) and supplements.  This website has more information on Eliquis (apixaban): http://www.eliquis.com/eliquis/home.

## 2022-03-15 ENCOUNTER — Telehealth: Payer: Self-pay | Admitting: *Deleted

## 2022-03-15 LAB — HEMOGLOBIN A1C
Hgb A1c MFr Bld: 6.2 % — ABNORMAL HIGH (ref 4.8–5.6)
Mean Plasma Glucose: 131 mg/dL

## 2022-03-15 NOTE — Patient Outreach (Signed)
  Care Coordination [facility] Note Transition Care Management Follow-up Telephone Call Date of discharge and from where: [phone] [facility] How have you been since you were released from the hospital? I am doing good. I am resting as I was told to do Any questions or concerns? No  Items Reviewed: Did the pt receive and understand the discharge instructions provided? Yes  Medications obtained and verified? Yes Patient is taking his Eliquis as ordered and stopped the Voltaren Other? No  Any new allergies since your discharge? No  Dietary orders reviewed? Yes Do you have support at home? Yes   Home Care and Equipment/Supplies: Were home health services ordered? not applicable If so, what is the name of the agency? N/a  Has the agency set up a time to come to the patient's home? not applicable Were any new equipment or medical supplies ordered?  No What is the name of the medical supply agency? N/a  Were you able to get the supplies/equipment? no Do you have any questions related to the use of the equipment or supplies? No  Functional Questionnaire: (I = Independent and D = Dependent) ADLs: I  Bathing/Dressing- I  Meal Prep- I  Eating- I  Maintaining continence- I  Transferring/Ambulation- I  Managing Meds- I  Follow up appointments reviewed:  PCP Hospital f/u appt confirmed? No   Specialist Hospital f/u appt confirmed? Yes  Ames Dura NP [id number] 9 AM. Are transportation arrangements needed? No  If their condition worsens, is the pt aware to call PCP or go to the Emergency Dept.? Yes Was the patient provided with contact information for the PCP's office or ED? Yes Was to pt encouraged to call back with questions or concerns? Yes  SDOH assessments and interventions completed:   Yes SDOH Interventions Today    Flowsheet Row Most Recent Value  SDOH Interventions   Food Insecurity Interventions Intervention Not Indicated  Housing Interventions Intervention Not Indicated   Transportation Interventions Intervention Not Indicated       Care Coordination Interventions:  No Care Coordination interventions needed at this time.   Encounter Outcome:  Pt. Visit Completed    Gean Maidens BSN RN [facility] [phone]

## 2022-03-15 NOTE — Patient Outreach (Signed)
  Care Coordination TOC Note Transition Care Management Unsuccessful Follow-up Telephone Call  Date of discharge and from where:  [facility] [phone]  Attempts:  1st Attempt  Reason for unsuccessful TCM follow-up call:  Left voice message  Chalsea Darko BSN RN [facility] [phone]   

## 2022-03-15 NOTE — Telephone Encounter (Signed)
Pt scheduled for next available w B Clent [facility]ges APP, 04/01/22 @ 9am

## 2022-03-16 NOTE — Assessment & Plan Note (Signed)
Encouraged DASH diet, decrease po intake and increase exercise as tolerated. Needs 7-8 hours of sleep nightly. Avoid trans fats, eat small, frequent meals every 4-5 hours with lean proteins, complex carbs and healthy fats. Minimize simple carbs, offered referral to healthy weight and wellness.

## 2022-03-16 NOTE — Assessment & Plan Note (Signed)
Encouraged heart healthy diet, increase exercise, avoid trans fats, consider a krill oil cap daily 

## 2022-03-16 NOTE — Assessment & Plan Note (Signed)
Well controlled, no changes to meds. Encouraged heart healthy diet such as the DASH diet and exercise as tolerated.  °

## 2022-03-16 NOTE — Assessment & Plan Note (Signed)
hgba1c acceptable, minimize simple carbs. Increase exercise as tolerated. Continue current meds 

## 2022-03-17 ENCOUNTER — Other Ambulatory Visit: Payer: Self-pay | Admitting: Family Medicine

## 2022-03-17 ENCOUNTER — Ambulatory Visit: Payer: BC Managed Care – PPO | Admitting: Family Medicine

## 2022-03-17 VITALS — BP 142/90 | HR 69 | Temp 97.9°F | Resp 18 | Ht 72.0 in | Wt 341.4 lb

## 2022-03-17 DIAGNOSIS — E785 Hyperlipidemia, unspecified: Secondary | ICD-10-CM | POA: Diagnosis not present

## 2022-03-17 DIAGNOSIS — I2699 Other pulmonary embolism without acute cor pulmonale: Secondary | ICD-10-CM

## 2022-03-17 DIAGNOSIS — E669 Obesity, unspecified: Secondary | ICD-10-CM | POA: Diagnosis not present

## 2022-03-17 DIAGNOSIS — E1169 Type 2 diabetes mellitus with other specified complication: Secondary | ICD-10-CM | POA: Diagnosis not present

## 2022-03-17 DIAGNOSIS — I10 Essential (primary) hypertension: Secondary | ICD-10-CM | POA: Diagnosis not present

## 2022-03-17 MED ORDER — METOPROLOL SUCCINATE ER 25 MG PO TB24: 25.0000 mg | ORAL_TABLET | Freq: Every day | ORAL | 1 refills | Status: DC

## 2022-03-17 MED ORDER — TIRZEPATIDE 2.5 MG/0.5ML ~~LOC~~ SOAJ: 2.5000 mg | SUBCUTANEOUS | 3 refills | Status: DC

## 2022-03-17 MED ORDER — APIXABAN 5 MG PO TABS: ORAL_TABLET | ORAL | 1 refills | Status: DC

## 2022-03-17 NOTE — Progress Notes (Signed)
Subjective:   By signing my name below, I, Kellie Simmering, attest that this documentation has been prepared under the direction and in the presence of Mosie Lukes, MD., 03/17/2022.     Patient ID: Jeffrey Molina, male    DOB: 1961/03/06, 61 y.o.   MRN: [id number]  Chief Complaint  Patient presents with   Hospitalization Follow-up   HPI Patient is in today for an office visit and is accompanied by his wife. He denies CP/ palpitations/SOB/HA/congestion/fevers/GI or GU c/o.  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 He describes the moment as having decreased vision and feeling tired. He was diagnosed with acute saddle pulmonary embolism with acute coronary pulmonale. He was discharged on 03/14/2022 and reports that he is still feeling fine today. He is currently taking  Eliquis 10 mg which he will lower to 5 mg starting on 03/20/2022.   Knee Replacement He has an appointment with his orthopedic surgeon next month to consider a right total knee replacement. However, he is wondering if he will need to stop taking Eliquis if he is to go through with this procedure.  Morbid Obesity His most recent HGBA1C from 03/12/2022 was 6.2 and his highest recorded HGBA1C was 6.9, making him eligable for injectable weight loss medications. He is interested in taking these medications. Body mass index is 46.3 kg/m.  Wt Readings from Last 3 Encounters:  03/17/22 (!) 341 lb 6.4 oz (154.9 kg)  03/14/22 (!) 337 lb 11.9 oz (153.2 kg)  11/16/21 (!) 342 lb 6.4 oz (155.3 kg)   Lab Results  Component Value Date   HGBA1C 6.2 (H) 03/12/2022   Past Medical History:  Diagnosis Date   Anemia 10/28/2013   pt. stated "no" at preop appt. 08-29-14   Arthritis    both knees   Chicken pox as a child   Cutaneous skin tags 10/28/2013   Diabetes mellitus type 2 in obese ([place]) 01/27/2014   GERD  (gastroesophageal reflux disease)    History of chicken pox    Hypertension    Low testosterone 10/28/2013   Peripheral artery disease ([place]) 11/03/2013   Preventative health care 10/26/2016   Sleep apnea    has c-pap machine   Tachycardia 05/15/2014    Past Surgical History:  Procedure Laterality Date   colonscopy     x 2   GASTRIC ROUX-EN-Y N/A 09/02/2014   Procedure: LAPAROSCOPIC ROUX-EN-Y GASTRIC BYPASS WITH UPPER ENDOSCOPY;  Surgeon: Greer Pickerel, MD;  Location: [facility];  Service: General;  Laterality: N/A;    Family History  Problem Relation Age of Onset   Fibromyalgia Mother    Cancer Father        skin cancer   Cataracts Father    Arthritis Father    Diabetes Maternal Grandfather     Social History   Socioeconomic History   Marital status: Married    Spouse name: Not on file   Number of children: Not on file   Years of education: Not on file   Highest education level: Not on file  Occupational History   Occupation: Best boy: [employer]  Tobacco Use   Smoking status: Never   Smokeless tobacco: Never  Vaping Use   Vaping Use: Never used  Substance and Sexual Activity   Alcohol use: No   Drug use: No   Sexual activity: Yes  Comment: lives with wife and children, works for [employer]  Other Topics Concern   Not on file  Social History Narrative   Not on file   Social Determinants of Health   Financial Resource Strain: Not on file  Food Insecurity: No Food Insecurity (03/15/2022)   Hunger Vital Sign    Worried About Running Out of Food in the Last Year: Never true    Ran Out of Food in the Last Year: Never true  Transportation Needs: No Transportation Needs (03/15/2022)   PRAPARE - Hydrologist (Medical): No    Lack of Transportation (Non-Medical): No  Physical Activity: Not on file  Stress: Not on file  Social Connections: Not on file  Intimate Partner Violence: Not on file     Outpatient Medications Prior to Visit  Medication Sig Dispense Refill   Calcium Carb-Cholecalciferol (CALCIUM 600 + D PO) Take 1 tablet by mouth daily.     metFORMIN (GLUCOPHAGE) 500 MG tablet Take 1 tablet (500 mg total) by mouth daily with breakfast. 30 tablet 3   Methylcobalamin (B-12) 5000 MCG TBDP Take 1 tablet by mouth daily.     metoprolol tartrate (LOPRESSOR) 25 MG tablet TAKE ONE-HALF (1/2) TABLET TWICE A DAY (Patient taking differently: Take 12.5 mg by mouth daily.) 90 tablet 3   Multiple Vitamin (MULTIVITAMIN WITH MINERALS) TABS tablet Take 1 tablet by mouth 2 (two) times daily.     Probiotic Product (PROBIOTIC & ACIDOPHILUS EX ST PO) Take 1 tablet by mouth daily.     sildenafil (VIAGRA) 50 MG tablet Take 1 tablet (50 mg total) by mouth daily as needed for erectile dysfunction. 30 tablet 0   apixaban (ELIQUIS) 5 MG TABS tablet Eliquis 10 mg po bid for 1 week, then 5 mg po bid 70 tablet 1   No facility-administered medications prior to visit.    Allergies  Allergen Reactions   Lisinopril-Hydrochlorothiazide Swelling    Lip swollen    Review of Systems  Constitutional:  Negative for chills and fever.  HENT:  Negative for congestion.   Respiratory:  Negative for shortness of breath.   Cardiovascular:  Negative for chest pain and palpitations.  Gastrointestinal:  Negative for abdominal pain, blood in stool, constipation, diarrhea, nausea and vomiting.  Genitourinary:  Negative for dysuria, frequency, hematuria and urgency.  Skin:           Neurological:  Negative for headaches.       Objective:    Physical Exam Constitutional:      General: He is not in acute distress.    Appearance: Normal appearance. He is obese. He is not ill-appearing.  HENT:     Head: Normocephalic and atraumatic.     Right Ear: External ear normal.     Left Ear: External ear normal.     Nose: Nose normal.     Mouth/Throat:     Mouth: Mucous membranes are moist.     Pharynx: Oropharynx  is clear.  Eyes:     General:        Right eye: No discharge.        Left eye: No discharge.     Extraocular Movements: Extraocular movements intact.     Conjunctiva/sclera: Conjunctivae normal.     Pupils: Pupils are equal, round, and reactive to light.  Cardiovascular:     Rate and Rhythm: Normal rate and regular rhythm.     Pulses: Normal pulses.     Heart  sounds: Normal heart sounds. No murmur heard.    No gallop.  Pulmonary:     Effort: Pulmonary effort is normal. No respiratory distress.     Breath sounds: Normal breath sounds. No wheezing or rales.  Abdominal:     General: Bowel sounds are normal.     Palpations: Abdomen is soft.     Tenderness: There is no abdominal tenderness. There is no guarding.  Musculoskeletal:        General: Normal range of motion.     Cervical back: Normal range of motion.     Right lower leg: No edema.     Left lower leg: No edema.  Skin:    General: Skin is warm and dry.  Neurological:     Mental Status: He is alert and oriented to person, place, and time.  Psychiatric:        Mood and Affect: Mood normal.        Behavior: Behavior normal.        Judgment: Judgment normal.    BP (!) 142/90 (BP Location: Left Arm, Patient Position: Sitting, Cuff Size: Large)   Pulse 69   Temp 97.9 F (36.6 C) (Oral)   Resp 18   Ht 6' (1.829 m)   Wt (!) 341 lb 6.4 oz (154.9 kg)   SpO2 94%   BMI 46.30 kg/m  Wt Readings from Last 3 Encounters:  03/17/22 (!) 341 lb 6.4 oz (154.9 kg)  03/14/22 (!) 337 lb 11.9 oz (153.2 kg)  11/16/21 (!) 342 lb 6.4 oz (155.3 kg)   Diabetic Foot Exam - Simple   No data filed    Lab Results  Component Value Date   WBC 7.6 03/17/2022   HGB 13.8 03/17/2022   HCT 42.2 03/17/2022   PLT 311.0 03/17/2022   GLUCOSE 93 03/17/2022   CHOL 189 03/17/2022   TRIG 137.0 03/17/2022   HDL 37.20 (L) 03/17/2022   LDLCALC 125 (H) 03/17/2022   ALT 35 03/17/2022   AST 18 03/17/2022   NA 142 03/17/2022   K 3.8 03/17/2022   CL  104 03/17/2022   CREATININE 1.22 03/17/2022   BUN 14 03/17/2022   CO2 28 03/17/2022   TSH 2.18 10/25/2016   PSA 0.75 07/21/2020   HGBA1C 6.2 (H) 03/12/2022   MICROALBUR 1.2 11/07/2014   Lab Results  Component Value Date   TSH 2.18 10/25/2016   Lab Results  Component Value Date   WBC 7.6 03/17/2022   HGB 13.8 03/17/2022   HCT 42.2 03/17/2022   MCV 90.5 03/17/2022   PLT 311.0 03/17/2022   Lab Results  Component Value Date   NA 142 03/17/2022   K 3.8 03/17/2022   CO2 28 03/17/2022   GLUCOSE 93 03/17/2022   BUN 14 03/17/2022   CREATININE 1.22 03/17/2022   BILITOT 0.4 03/17/2022   ALKPHOS 57 03/17/2022   AST 18 03/17/2022   ALT 35 03/17/2022   PROT 6.7 03/17/2022   ALBUMIN 3.8 03/17/2022   CALCIUM 8.6 03/17/2022   ANIONGAP 8 03/12/2022   GFR 64.74 03/17/2022   Lab Results  Component Value Date   CHOL 189 03/17/2022   Lab Results  Component Value Date   HDL 37.20 (L) 03/17/2022   Lab Results  Component Value Date   LDLCALC 125 (H) 03/17/2022   Lab Results  Component Value Date   TRIG 137.0 03/17/2022   Lab Results  Component Value Date   CHOLHDL 5 03/17/2022   Lab Results  Component  Value Date   HGBA1C 6.2 (H) 03/12/2022      Assessment & Plan:  Labs: Blood work today will check cholesterol, CBC, and vitamin D.   Blood Pressure: Metoprolol Tartrate 25 mg changed to Metoprolol Succinate 25 mg.  Immunizations: Reviewed immunizations and informed patient to consider to Prevnar 20 and RSV immunizations.  Pain Management: Recommended up to 3000 mg of Tylenol daily, Bio Freeze, and topicals to manage pain.  Refills: Eliquis 5 mg refilled.  Weight Loss: Prescribed Mounjaro 0.25 mg. Encouraged exercise, heart healthy diet (protein every 3-5 hours), and hydration (60-80 oz of water daily). Problem List Items Addressed This Visit     HTN (hypertension) (Chronic)    Well controlled, no changes to meds. Encouraged heart healthy diet such as the DASH diet  and exercise as tolerated.       Relevant Medications   apixaban (ELIQUIS) 5 MG TABS tablet   metoprolol succinate (TOPROL-XL) 25 MG 24 hr tablet   Other Relevant Orders   CBC w/Diff (Completed)   Morbid obesity (HCC) (Chronic)    Encouraged DASH diet, decrease po intake and increase exercise as tolerated. Needs 7-8 hours of sleep nightly. Avoid trans fats, eat small, frequent meals every 4-5 hours with lean proteins, complex carbs and healthy fats. Minimize simple carbs, offered referral to healthy weight and wellness.       Relevant Medications   tirzepatide [facility]) 2.5 MG/0.5ML Pen   Diabetes mellitus type 2 in obese (HCC) - Primary (Chronic)    hgba1c acceptable, minimize simple carbs. Increase exercise as tolerated. Continue current meds      Relevant Medications   tirzepatide (MOUNJARO) 2.5 MG/0.5ML Pen   Other Relevant Orders   VITAMIN D 25 Hydroxy (Vit-D Deficiency, Fractures) (Completed)   Hyperlipidemia    Encouraged heart healthy diet, increase exercise, avoid trans fats, consider a krill oil cap daily      Relevant Medications   apixaban (ELIQUIS) 5 MG TABS tablet   metoprolol succinate (TOPROL-XL) 25 MG 24 hr tablet   Other Relevant Orders   Lipid panel (Completed)   Pulmonary embolism ([place])    Recently hospitalized with a saddle PE and is feeling much better on Eliquis which he will be on for the next 6 months. No previous personal or FH of blood clot      Relevant Medications   apixaban (ELIQUIS) 5 MG TABS tablet   metoprolol succinate (TOPROL-XL) 25 MG 24 hr tablet   Other Visit Diagnoses     Hypocalcemia       Relevant Orders   Comp Met (CMET) (Completed)   VITAMIN D 25 Hydroxy (Vit-D Deficiency, Fractures) (Completed)      Meds ordered this encounter  Medications   apixaban (ELIQUIS) 5 MG TABS tablet    Sig: Eliquis 10 mg po bid for 1 week, then 5 mg po bid    Dispense:  180 tablet    Refill:  1   tirzepatide (MOUNJARO) 2.5 MG/0.5ML Pen     Sig: Inject 2.5 mg into the skin once a week.    Dispense:  2 mL    Refill:  3   metoprolol succinate (TOPROL-XL) 25 MG 24 hr tablet    Sig: Take 1 tablet (25 mg total) by mouth daily.    Dispense:  90 tablet    Refill:  1   I, Penni Homans, MD, personally preformed the services described in this documentation.  All medical record entries made by the scribe were at my  direction and in my presence.  I have reviewed the chart and discharge instructions (if applicable) and agree that the record reflects my personal performance and is accurate and complete. 03/17/2022  I,Mohammed Iqbal,acting as a scribe for Penni Homans, MD.,have documented all relevant documentation on the behalf of Penni Homans, MD,as directed by  Penni Homans, MD while in the presence of Penni Homans, MD.  Penni Homans, MD

## 2022-03-17 NOTE — Patient Instructions (Addendum)
Bone density shows osteopenia, which is thinner than normal but not as bad as osteoporosis. Recommend calcium intake of 1200 to 1500 mg daily, divided into roughly 3 doses. Best source is the diet and a single dairy serving is about 500 mg, a supplement of calcium citrate once or twice daily to balance diet is fine if not getting enough in diet. Also need Vitamin D 2000 IU caps, 1 cap daily if not already taking vitamin D. Also recommend weight baring exercise on hips and upper body to keep bones strong    Consider Prevnar 20 at pharmacy     Pulmonary Embolism  A pulmonary embolism (PE) is a sudden blockage or decrease of blood flow in one or both lungs that happens when a clot travels into the arteries of the lung (pulmonary arteries). Most blockages come from a blood clot that forms in the vein of a leg or arm (deep vein thrombosis, DVT) and travels to the lungs. A clot is blood that has thickened into a gel or solid. PE is a dangerous and life-threatening condition that needs to be treated right away. What are the causes? This condition is usually caused by a blood clot that forms in a vein and moves to the lungs. In rare cases, it may be caused by air, fat, part of a tumor, or other tissue that moves through the veins and into the lungs. What increases the risk? The following factors may make you more likely to develop this condition: Experiencing a traumatic injury, such as breaking a hip or leg. Having: A spinal cord injury. Major surgery, especially hip or knee replacement, or surgery on parts of the nervous system or on the abdomen. A stroke. A blood-clotting disease. Long-term (chronic) lung or heart disease. Cancer, especially if you are being treated with chemotherapy. A central venous catheter. Taking medicines that contain estrogen. These include birth control pills and hormone replacement therapy. Being: Pregnant. In the period of time after your baby is delivered  (postpartum). Older than age 67. Overweight. A smoker, especially if you have other risks. Not very active (sedentary), not being able to move at all, or spending long periods sitting, such as travel over 6 hours. You are also at a greater risk if you have a leg in a cast or splint. What are the signs or symptoms? Symptoms of this condition usually start suddenly and include: Shortness of breath during activity or at rest. Coughing, coughing up blood, or coughing up bloody mucus. Chest pain, back pain, or shoulder blade pain that gets worse with deep breaths. Rapid or irregular heartbeat. Feeling light-headed or dizzy, or fainting. Feeling anxious. Pain and swelling in a leg. This is a symptom of DVT, which can lead to PE. How is this diagnosed? This condition may be diagnosed based on your medical history, a physical exam, and tests. Tests may include: Blood tests. An ECG (electrocardiogram) of the heart. A CT pulmonary angiogram. This test checks blood flow in and around your lungs. A ventilation-perfusion scan, also called a lung VQ scan. This test measures air flow and blood flow to the lungs. An ultrasound to check for a DVT. How is this treated? Treatment for this condition depends on many factors, such as the cause of your PE, your risk for bleeding or developing more clots, and other medical conditions you may have. Treatment aims to stop blood clots from forming or growing larger. In some cases, treatment may be aimed at breaking apart or removing the  blood clot. Treatment may include: Medicines, such as: Blood thinning medicines, also called anticoagulants, to stop clots from forming and growing. Medicines that break apart clots (fibrinolytics). Procedures, such as: Using a flexible tube to remove a blood clot (embolectomy) or to deliver medicine to destroy it (catheter-directed thrombolysis). Surgery to remove the clot (surgical embolectomy). This is rare. You may need a  combination of immediate, long-term, and extended treatments. Your treatment may continue for several months (maintenance therapy) or longer depending on your medical conditions. You and your health care provider will work together to choose the treatment program that is best for you. Follow these instructions at home: Medicines Take over-the-counter and prescription medicines only as told by your health care provider. If you are taking blood thinners: Talk with your health care provider before you take any medicines that contain aspirin or NSAIDs, such as ibuprofen. These medicines increase your risk for dangerous bleeding. Take your medicine exactly as told, at the same time every day. Avoid activities that could cause injury or bruising, and follow instructions about how to prevent falls. Wear a medical alert bracelet or carry a card that lists what medicines you take. Understand what foods and drugs interact with any medicines that you are taking. General instructions Ask your health care provider when you may return to your normal activities. Avoid sitting or lying for a long time without moving. Maintain a healthy weight. Ask your health care provider what weight is healthy for you. Do not use any products that contain nicotine or tobacco. These products include cigarettes, chewing tobacco, and vaping devices, such as e-cigarettes. If you need help quitting, ask your health care provider. Talk with your health care provider about any travel plans. It is important to make sure that you are still able to take your medicine while traveling. Keep all follow-up visits. This is important. Where to find more information American Lung Association: www.lung.org Centers for Disease Control and Prevention: [employer].com.br Contact a health care provider if: You missed a dose of your blood thinner medicine. You have a fever. Get help right away if: You have: New or increased pain, swelling, warmth, or  redness in an arm or leg. Shortness of breath that gets worse during activity or at rest. Worsening chest pain. A rapid or irregular heartbeat. A severe headache. Vision changes. A serious fall or accident, or you hit your head. Blood in your vomit, stool, or urine. A cut that will not stop bleeding. You cough up blood. You feel light-headed or dizzy, and that feeling does not go away. You cannot move your arms or legs. You are confused or have memory loss. These symptoms may represent a serious problem that is an emergency. Do not wait to see if the symptoms will go away. Get medical help right away. Call your local emergency services (911 in the [place] Do not drive yourself to the hospital. Summary A pulmonary embolism (PE) is a serious and potentially life-threatening condition. It happens when a blood clot from one part of the body travels to the arteries of the lung, causing a sudden blockage or decrease of blood flow to the lungs. This may result in shortness of breath, chest pain, dizziness, and fainting. Treatments for this condition usually include medicines to thin your blood (anticoagulants) or medicines to break apart blood clots. If you are given blood thinners, take your medicine exactly as told by your health care provider, at the same time every day. This is important. Understand what foods  and drugs interact with any medicines that you are taking. If you have signs of PE or DVT, call your local emergency services (911 in the [place] This information is not intended to replace advice given to you by your health care provider. Make sure you discuss any questions you have with your health care provider. Document Revised: 02/07/2020 Document Reviewed: 02/07/2020 Elsevier Patient Education  2023 [employer].

## 2022-03-17 NOTE — Telephone Encounter (Signed)
PA initiated via Covermymeds; KEY: [id number]. PA approved.    This request has been approved using information available on the patient's profile. [id number];[id number];Review Type:Prior Auth;Coverage Start Date:02/15/2022;Coverage End Date:03/17/2023;

## 2022-03-18 ENCOUNTER — Other Ambulatory Visit: Payer: Self-pay

## 2022-03-18 LAB — CBC WITH DIFFERENTIAL/PLATELET
Basophils Absolute: 0.1 10*3/uL (ref 0.0–0.1)
Basophils Relative: 1.2 % (ref 0.0–3.0)
Eosinophils Absolute: 0.3 10*3/uL (ref 0.0–0.7)
Eosinophils Relative: 3.9 % (ref 0.0–5.0)
HCT: 42.2 % (ref 39.0–52.0)
Hemoglobin: 13.8 g/dL (ref 13.0–17.0)
Lymphocytes Relative: 17.8 % (ref 12.0–46.0)
Lymphs Abs: 1.4 10*3/uL (ref 0.7–4.0)
MCHC: 32.6 g/dL (ref 30.0–36.0)
MCV: 90.5 fl (ref 78.0–100.0)
Monocytes Absolute: 0.8 10*3/uL (ref 0.1–1.0)
Monocytes Relative: 10.6 % (ref 3.0–12.0)
Neutro Abs: 5.1 10*3/uL (ref 1.4–7.7)
Neutrophils Relative %: 66.5 % (ref 43.0–77.0)
Platelets: 311 10*3/uL (ref 150.0–400.0)
RBC: 4.67 Mil/uL (ref 4.22–5.81)
RDW: 14.6 % (ref 11.5–15.5)
WBC: 7.6 10*3/uL (ref 4.0–10.5)

## 2022-03-18 LAB — COMPREHENSIVE METABOLIC PANEL
ALT: 35 U/L (ref 0–53)
AST: 18 U/L (ref 0–37)
Albumin: 3.8 g/dL (ref 3.5–5.2)
Alkaline Phosphatase: 57 U/L (ref 39–117)
BUN: 14 mg/dL (ref 6–23)
CO2: 28 mEq/L (ref 19–32)
Calcium: 8.6 mg/dL (ref 8.4–10.5)
Chloride: 104 mEq/L (ref 96–112)
Creatinine, Ser: 1.22 mg/dL (ref 0.40–1.50)
GFR: 64.74 mL/min (ref >60.00)
Glucose, Bld: 93 mg/dL (ref 70–99)
Potassium: 3.8 mEq/L (ref 3.5–5.1)
Sodium: 142 mEq/L (ref 135–145)
Total Bilirubin: 0.4 mg/dL (ref 0.2–1.2)
Total Protein: 6.7 g/dL (ref 6.0–8.3)

## 2022-03-18 LAB — LIPID PANEL
Cholesterol: 189 mg/dL (ref 0–200)
HDL: 37.2 mg/dL — ABNORMAL LOW (ref >39.00)
LDL Cholesterol: 125 mg/dL — ABNORMAL HIGH (ref 0–99)
NonHDL: 152.19
Total CHOL/HDL Ratio: 5
Triglycerides: 137 mg/dL (ref 0.0–149.0)
VLDL: 27.4 mg/dL (ref 0.0–40.0)

## 2022-03-18 LAB — VITAMIN D 25 HYDROXY (VIT D DEFICIENCY, FRACTURES): VITD: 32.22 ng/mL (ref 30.00–100.00)

## 2022-03-18 MED ORDER — ATORVASTATIN CALCIUM 10 MG PO TABS: 10.0000 mg | ORAL_TABLET | Freq: Every day | ORAL | 3 refills | Status: DC

## 2022-03-19 NOTE — Assessment & Plan Note (Signed)
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 No previous personal or FH of blood clot

## 2022-03-22 ENCOUNTER — Inpatient Hospital Stay: Payer: BC Managed Care – PPO | Admitting: Family Medicine

## 2022-03-24 ENCOUNTER — Other Ambulatory Visit: Payer: Self-pay

## 2022-03-24 MED ORDER — ATORVASTATIN CALCIUM 10 MG PO TABS: 10.0000 mg | ORAL_TABLET | Freq: Every day | ORAL | 3 refills | Status: DC

## 2022-03-30 ENCOUNTER — Other Ambulatory Visit: Payer: Self-pay

## 2022-03-30 MED ORDER — ATORVASTATIN CALCIUM 10 MG PO TABS: 10.0000 mg | ORAL_TABLET | Freq: Every day | ORAL | 3 refills | Status: DC

## 2022-04-01 ENCOUNTER — Encounter: Payer: Self-pay | Admitting: Primary Care

## 2022-04-01 ENCOUNTER — Ambulatory Visit: Payer: BC Managed Care – PPO | Admitting: Primary Care

## 2022-04-01 VITALS — BP 120/82 | HR 85 | Ht 72.0 in | Wt 337.2 lb

## 2022-04-01 DIAGNOSIS — G4733 Obstructive sleep apnea (adult) (pediatric): Secondary | ICD-10-CM

## 2022-04-01 DIAGNOSIS — I2699 Other pulmonary embolism without acute cor pulmonale: Secondary | ICD-10-CM | POA: Diagnosis not present

## 2022-04-01 LAB — D-DIMER, QUANTITATIVE: D-Dimer, Quant: 0.64 mcg/mL FEU — ABNORMAL HIGH (ref <0.50)

## 2022-04-01 NOTE — Patient Instructions (Addendum)
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 and risk factors including obesity We will have you see hematology for full work up  No additional imaging needed unless we plan on taking you off blood thinner we would recommend repeating LE ultrasound Continue Eliquis 5mg  twice daily Do not stop blood thinner without direction from physician  You do not need oxygen  CPAP compliance is great, sleep apnea is well controlled on current pressure settings No changes Continue to wear CPAP every night  Weight loss encourage as able   Orders: Ambulatory walk today on room air   Referral: Hematology re: PE/DVT  Follow-up: 6 months with Beth NP for CPAP follow-up     Pulmonary Embolism  A pulmonary embolism (PE) is a sudden blockage or decrease of blood flow in one or both lungs that happens when a clot travels into the arteries of the lung (pulmonary arteries). Most blockages come from a blood clot that forms in the vein of a leg or arm (deep vein thrombosis, DVT) and travels to the lungs. A clot is blood that has thickened into a gel or solid. PE is a dangerous and life-threatening condition that needs to be treated right away. What are the causes? This condition is usually caused by a blood clot that forms in a vein and moves to the lungs. In rare cases, it may be caused by air, fat, part of a tumor, or other tissue that moves through the veins and into the lungs. What increases the risk? The following factors may make you more likely to develop this condition: Experiencing a traumatic injury, such as breaking a hip or leg. Having: A spinal cord injury. Major surgery, especially hip or knee replacement, or surgery on parts of the nervous system or on the abdomen. A stroke. A blood-clotting disease. Long-term (chronic) lung or heart disease. Cancer, especially if you are being treated with chemotherapy. A central venous catheter. Taking medicines that contain estrogen.  These include birth control pills and hormone replacement therapy. Being: Pregnant. In the period of time after your baby is delivered (postpartum). Older than age 83. Overweight. A smoker, especially if you have other risks. Not very active (sedentary), not being able to move at all, or spending long periods sitting, such as travel over 6 hours. You are also at a greater risk if you have a leg in a cast or splint. What are the signs or symptoms? Symptoms of this condition usually start suddenly and include: Shortness of breath during activity or at rest. Coughing, coughing up blood, or coughing up bloody mucus. Chest pain, back pain, or shoulder blade pain that gets worse with deep breaths. Rapid or irregular heartbeat. Feeling light-headed or dizzy, or fainting. Feeling anxious. Pain and swelling in a leg. This is a symptom of DVT, which can lead to PE. How is this diagnosed? This condition may be diagnosed based on your medical history, a physical exam, and tests. Tests may include: Blood tests. An ECG (electrocardiogram) of the heart. A CT pulmonary angiogram. This test checks blood flow in and around your lungs. A ventilation-perfusion scan, also called a lung VQ scan. This test measures air flow and blood flow to the lungs. An ultrasound to check for a DVT. How is this treated? Treatment for this condition depends on many factors, such as the cause of your PE, your risk for bleeding or developing more clots, and other medical conditions you may have. Treatment aims to stop blood clots from forming or  growing larger. In some cases, treatment may be aimed at breaking apart or removing the blood clot. Treatment may include: Medicines, such as: Blood thinning medicines, also called anticoagulants, to stop clots from forming and growing. Medicines that break apart clots (fibrinolytics). Procedures, such as: Using a flexible tube to remove a blood clot (embolectomy) or to deliver  medicine to destroy it (catheter-directed thrombolysis). Surgery to remove the clot (surgical embolectomy). This is rare. You may need a combination of immediate, long-term, and extended treatments. Your treatment may continue for several months (maintenance therapy) or longer depending on your medical conditions. You and your health care provider will work together to choose the treatment program that is best for you. Follow these instructions at home: Medicines Take over-the-counter and prescription medicines only as told by your health care provider. If you are taking blood thinners: Talk with your health care provider before you take any medicines that contain aspirin or NSAIDs, such as ibuprofen. These medicines increase your risk for dangerous bleeding. Take your medicine exactly as told, at the same time every day. Avoid activities that could cause injury or bruising, and follow instructions about how to prevent falls. Wear a medical alert bracelet or carry a card that lists what medicines you take. Understand what foods and drugs interact with any medicines that you are taking. General instructions Ask your health care provider when you may return to your normal activities. Avoid sitting or lying for a long time without moving. Maintain a healthy weight. Ask your health care provider what weight is healthy for you. Do not use any products that contain nicotine or tobacco. These products include cigarettes, chewing tobacco, and vaping devices, such as e-cigarettes. If you need help quitting, ask your health care provider. Talk with your health care provider about any travel plans. It is important to make sure that you are still able to take your medicine while traveling. Keep all follow-up visits. This is important. Where to find more information American Lung Association: www.lung.org Centers for Disease Control and Prevention: http://www.wolf.info/ Contact a health care provider if: You missed a  dose of your blood thinner medicine. You have a fever. Get help right away if: You have: New or increased pain, swelling, warmth, or redness in an arm or leg. Shortness of breath that gets worse during activity or at rest. Worsening chest pain. A rapid or irregular heartbeat. A severe headache. Vision changes. A serious fall or accident, or you hit your head. Blood in your vomit, stool, or urine. A cut that will not stop bleeding. You cough up blood. You feel light-headed or dizzy, and that feeling does not go away. You cannot move your arms or legs. You are confused or have memory loss. These symptoms may represent a serious problem that is an emergency. Do not wait to see if the symptoms will go away. Get medical help right away. Call your local emergency services (911 in the [place] Do not drive yourself to the hospital. Summary A pulmonary embolism (PE) is a serious and potentially life-threatening condition. It happens when a blood clot from one part of the body travels to the arteries of the lung, causing a sudden blockage or decrease of blood flow to the lungs. This may result in shortness of breath, chest pain, dizziness, and fainting. Treatments for this condition usually include medicines to thin your blood (anticoagulants) or medicines to break apart blood clots. If you are given blood thinners, take your medicine exactly as told by your  health care provider, at the same time every day. This is important. Understand what foods and drugs interact with any medicines that you are taking. If you have signs of PE or DVT, call your local emergency services (911 in the [place] This information is not intended to replace advice given to you by your health care provider. Make sure you discuss any questions you have with your health care provider. Document Revised: 02/07/2020 Document Reviewed: 02/07/2020 [employer] Patient Education  2023 [employer].

## 2022-04-01 NOTE — Progress Notes (Unsigned)
@Patient  ID: , male    DOB: 16-May-1960, 62 y.o.   MRN: [id number]  Chief Complaint  Patient presents with   Hospitalization Follow-up    Cpap working well, feeling over all good    Referring provider: [id number], MD  HPI: 62 year old male, never smoked.  Past medical history significant for hypertension, OSA on CPAP, type 2 diabetes, hyperlipidemia, morbid obesity s/p gastric bypass.   Previous [facility] pulmonary encounter: 07/08/2021 Patient presents today for sleep consult. Patient has hx severe sleep apnea. Needs prescription for new CPAP machine d/t [facility] recall. He continues to use current CPAP machine and reports 100% compliance. There was no data on SD card. He has a 07/10/2021. [facility] will be sending replacement machine. He does not currently have a DME company, he orders his supplies online through [facility].   Sleep questionnaire Symptoms-  Current CPAP recalled, needs new prescription  Prior sleep study- 11/28/2004 NPSG>> AHI 122.5/hr, mean saturation 77% on room air / Weight 465lb  Bedtime- 10:30-11:30pm Time to fall asleep- 5 minutes  Nocturnal awakenings- 2-3 times  Out of bed/start of day- 6:25am Weight changes- Patient had gastric bypass down 100 lbs since sleep study, weight +15 lbs last 2 years  Do you operate heavy machinery- No Do you currently wear CPAP- Yes Do you current wear oxygen- No [facility]- 3  08/11/2021 Patient presents today for OSA follow-up/compliance check. He received replacement CPAP from [facility] since our last visit, we spoke with one of our local DME companies and they told 08/13/2021 that despite him getting a replacement CPAP machine it is so old that they are not able to access download or provide replacement parts or SD card. They recommend he get a new CPAP machine with them. He gets his CPAP supplies through [facility]. Partial download from March 2017 showed that he was using pressure 17cm h20.   11/16/2021  Patient  presents today for 3 month follow-up/OSA.  During last visit in May patient was ordered for new CPAP machine with [facility].  Pressure setting was 17 cm H2O, recommended auto titrate 5 to 20 cm H2O. He is 97% compliant with CPAP use.  No issues with mask fit or pressure settings.  He received a new CPAP machine since our last visit and it is working well for him. He does report fragmented sleep due to nocturia.  He has no trouble falling back to sleep.  No residual daytime sleepiness.   Airview download 10/16/2021 - 11/14/2021 29/30 days; 97% greater than 4 hours Average usage 6 hours 54 minutes Pressure 5 to 20 cm H2O (12.4 cm H2O-95%) Air leaks 10.3 L/min (95%) AHI 1.4  04/01/2022- Interim hx  Patient presents today for hospital follow-up. Follows with our office for OSA.    No signs of bleeding  SpO2 low on ambulation was 88%, maintained O2 90-91% RA  Echo without significant RH strain LE dopplers with LLE DVT PE is considered unprovoked  Needs follow-up with Hematology    Airview download 01/01/22-03/31/22 Usage 90/90 days; 89 days (99%) Usage 6 hours 41 mins Pressure 5-20cm h20 (12.3cm - 95%) Airleaks 17.1L/min (95%) AHI 1.3   Allergies  Allergen Reactions   Lisinopril-Hydrochlorothiazide Swelling    Lip swollen    Immunization History  Administered Date(s) Administered   COVID-19, mRNA, vaccine(Comirnaty)12 years and older 02/19/2022   Influenza,inj,Quad PF,6+ Mos 02/19/2022   Influenza-Unspecified 01/20/2014, 11/21/2014, 02/03/2020   PFIZER Comirnaty(Gray Top)Covid-19 Tri-Sucrose Vaccine 06/05/2019, 07/01/2019, 02/20/2020   Zoster Recombinat (Shingrix)  04/16/2020, 07/13/2020    Past Medical History:  Diagnosis Date   Anemia 10/28/2013   pt. stated "no" at preop appt. 08-29-14   Arthritis    both knees   Chicken pox as a child   Cutaneous skin tags 10/28/2013   Diabetes mellitus type 2 in obese (HCC) 01/27/2014   GERD (gastroesophageal reflux disease)    History of  chicken pox    Hypertension    Low testosterone 10/28/2013   Peripheral artery disease (HCC) 11/03/2013   Preventative health care 10/26/2016   Sleep apnea    has c-pap machine   Tachycardia 05/15/2014    Tobacco History: Social History   Tobacco Use  Smoking Status Never  Smokeless Tobacco Never   Counseling given: Not Answered   Outpatient Medications Prior to Visit  Medication Sig Dispense Refill   apixaban (ELIQUIS) 5 MG TABS tablet Eliquis 10 mg po bid for 1 week, then 5 mg po bid 180 tablet 1   atorvastatin (LIPITOR) 10 MG tablet Take 1 tablet (10 mg total) by mouth daily. 90 tablet 3   Calcium Carb-Cholecalciferol (CALCIUM 600 + D PO) Take 1 tablet by mouth daily.     metFORMIN (GLUCOPHAGE) 500 MG tablet Take 1 tablet (500 mg total) by mouth daily with breakfast. 30 tablet 3   Methylcobalamin (B-12) 5000 MCG TBDP Take 1 tablet by mouth daily.     metoprolol succinate (TOPROL-XL) 25 MG 24 hr tablet Take 1 tablet (25 mg total) by mouth daily. 90 tablet 1   metoprolol tartrate (LOPRESSOR) 25 MG tablet TAKE ONE-HALF (1/2) TABLET TWICE A DAY (Patient taking differently: Take 12.5 mg by mouth daily.) 90 tablet 3   Multiple Vitamin (MULTIVITAMIN WITH MINERALS) TABS tablet Take 1 tablet by mouth 2 (two) times daily.     Probiotic Product (PROBIOTIC & ACIDOPHILUS EX ST PO) Take 1 tablet by mouth daily.     sildenafil (VIAGRA) 50 MG tablet Take 1 tablet (50 mg total) by mouth daily as needed for erectile dysfunction. 30 tablet 0   tirzepatide (MOUNJARO) 2.5 MG/0.5ML Pen Inject 2.5 mg into the skin once a week. 2 mL 3   No facility-administered medications prior to visit.      Review of Systems  Review of Systems  Constitutional: Negative.   HENT: Negative.    Respiratory: Negative.  Negative for choking, chest tightness, shortness of breath and wheezing.   Cardiovascular: Negative.      Physical Exam  BP 120/82 (BP Location: Right Arm, Cuff Size: Large)   Pulse 85   Ht 6'  (1.829 m)   Wt (!) 337 lb 3.2 oz (153 kg)   SpO2 95%   BMI 45.73 kg/m  Physical Exam Constitutional:      Appearance: Normal appearance. He is obese.  HENT:     Head: Normocephalic and atraumatic.  Cardiovascular:     Rate and Rhythm: Normal rate and regular rhythm.  Pulmonary:     Effort: Pulmonary effort is normal.     Breath sounds: Normal breath sounds.  Neurological:     General: No focal deficit present.     Mental Status: He is alert and oriented to person, place, and time. Mental status is at baseline.  Psychiatric:        Mood and Affect: Mood normal.        Behavior: Behavior normal.        Thought Content: Thought content normal.        Judgment: Judgment normal.  Lab Results:  CBC    Component Value Date/Time   WBC 7.6 03/17/2022 1234   RBC 4.67 03/17/2022 1234   HGB 13.8 03/17/2022 1234   HCT 42.2 03/17/2022 1234   PLT 311.0 03/17/2022 1234   MCV 90.5 03/17/2022 1234   MCV 93.5 05/26/2013 1040   MCH 28.9 03/14/2022 0553   MCHC 32.6 03/17/2022 1234   RDW 14.6 03/17/2022 1234   LYMPHSABS 1.4 03/17/2022 1234   MONOABS 0.8 03/17/2022 1234   EOSABS 0.3 03/17/2022 1234   BASOSABS 0.1 03/17/2022 1234    BMET    Component Value Date/Time   NA 142 03/17/2022 1234   K 3.8 03/17/2022 1234   CL 104 03/17/2022 1234   CO2 28 03/17/2022 1234   GLUCOSE 93 03/17/2022 1234   BUN 14 03/17/2022 1234   CREATININE 1.22 03/17/2022 1234   CREATININE 0.96 06/16/2016 1734   CALCIUM 8.6 03/17/2022 1234   GFRNONAA >60 03/12/2022 0650   GFRAA >60 09/04/2014 0420    BNP    Component Value Date/Time   BNP 27.0 03/11/2022 1616    ProBNP No results found for: "PROBNP"  Imaging: ECHOCARDIOGRAM COMPLETE  Result Date: 03/12/2022    ECHOCARDIOGRAM REPORT   Patient Name:   Jeffrey MurphyWayne Cacciola Date of Exam: 03/12/2022 Medical Rec #:  [id number]      Height:       72.0 in Accession #:    [id number]     Weight:       340.0 lb Date of Birth:  1960/11/25      BSA:           2.670 m Patient Age:    61 years       BP:           127/87 mmHg Patient Gender: M              HR:           93 bpm. Exam Location:  Inpatient Procedure: 2D Echo, Cardiac Doppler, Color Doppler and Intracardiac            Opacification Agent Indications:    Pulmonary Embolus I26.09  History:        Patient has no prior history of Echocardiogram examinations.                 PAD, Arrythmias:Tachycardia, Signs/Symptoms:Dyspnea; Risk                 Factors:Hypertension, Sleep Apnea, Diabetes and Dyslipidemia.  Sonographer:    Lucendia HerrlichShanika Turnbull Referring Phys: Lyda PeroneGARDNER, JARED, M  Sonographer Comments: Technically difficult study due to poor echo windows, suboptimal apical window and patient is obese. IMPRESSIONS  1. Septal hypokinesis . Left ventricular ejection fraction, by estimation, is 50 to 55%. The left ventricle has low normal function. The left ventricle has no regional wall motion abnormalities. Left ventricular diastolic parameters were normal.  2. Right ventricular systolic function is mildly reduced. The right ventricular size is mildly enlarged.  3. The mitral valve is abnormal. No evidence of mitral valve regurgitation. No evidence of mitral stenosis.  4. The aortic valve is tricuspid. There is mild calcification of the aortic valve. There is mild thickening of the aortic valve. Aortic valve regurgitation is not visualized. Aortic valve sclerosis is present, with no evidence of aortic valve stenosis.  5. The inferior vena cava is normal in size with greater than 50% respiratory variability, suggesting right atrial pressure of 3 mmHg. FINDINGS  Left Ventricle: Septal hypokinesis.  Left ventricular ejection fraction, by estimation, is 50 to 55%. The left ventricle has low normal function. The left ventricle has no regional wall motion abnormalities. Definity contrast agent was given IV to delineate the left ventricular endocardial borders. The left ventricular internal cavity size was normal in size. There  is no left ventricular hypertrophy. Left ventricular diastolic parameters were normal. Right Ventricle: The right ventricular size is mildly enlarged. No increase in right ventricular wall thickness. Right ventricular systolic function is mildly reduced. Left Atrium: Left atrial size was normal in size. Right Atrium: Right atrial size was normal in size. Pericardium: There is no evidence of pericardial effusion. Mitral Valve: The mitral valve is abnormal. There is mild thickening of the mitral valve leaflet(s). There is moderate calcification of the mitral valve leaflet(s). No evidence of mitral valve regurgitation. No evidence of mitral valve stenosis. Tricuspid Valve: The tricuspid valve is normal in structure. Tricuspid valve regurgitation is trivial. No evidence of tricuspid stenosis. Aortic Valve: The aortic valve is tricuspid. There is mild calcification of the aortic valve. There is mild thickening of the aortic valve. Aortic valve regurgitation is not visualized. Aortic valve sclerosis is present, with no evidence of aortic valve stenosis. Aortic valve mean gradient measures 2.0 mmHg. Aortic valve peak gradient measures 4.5 mmHg. Aortic valve area, by VTI measures 3.08 cm. Pulmonic Valve: The pulmonic valve was normal in structure. Pulmonic valve regurgitation is not visualized. No evidence of pulmonic stenosis. Aorta: The aortic root is normal in size and structure. Venous: The inferior vena cava is normal in size with greater than 50% respiratory variability, suggesting right atrial pressure of 3 mmHg. IAS/Shunts: No atrial level shunt detected by color flow Doppler.  LEFT VENTRICLE PLAX 2D LVIDd:         5.00 cm     Diastology LVIDs:         3.30 cm     LV e' medial:    7.07 cm/s LV PW:         1.20 cm     LV E/e' medial:  8.0 LV IVS:        1.10 cm     LV e' lateral:   6.20 cm/s LVOT diam:     2.30 cm     LV E/e' lateral: 9.1 LV SV:         54 LV SV Index:   20 LVOT Area:     4.15 cm  LV Volumes (MOD)  LV vol d, MOD A2C: 91.6 ml LV vol d, MOD A4C: 80.8 ml LV vol s, MOD A2C: 51.0 ml LV vol s, MOD A4C: 51.4 ml LV SV MOD A2C:     40.6 ml LV SV MOD A4C:     80.8 ml LV SV MOD BP:      35.7 ml RIGHT VENTRICLE TAPSE (M-mode): 1.6 cm LEFT ATRIUM             Index LA diam:        3.40 cm 1.27 cm/m LA Vol (A2C):   34.7 ml 13.00 ml/m LA Vol (A4C):   22.3 ml 8.35 ml/m LA Biplane Vol: 29.4 ml 11.01 ml/m  AORTIC VALVE AV Area (Vmax):    2.93 cm AV Area (Vmean):   2.96 cm AV Area (VTI):     3.08 cm AV Vmax:           106.00 cm/s AV Vmean:          69.000 cm/s AV VTI:  0.176 m AV Peak Grad:      4.5 mmHg AV Mean Grad:      2.0 mmHg LVOT Vmax:         74.65 cm/s LVOT Vmean:        49.100 cm/s LVOT VTI:          0.130 m LVOT/AV VTI ratio: 0.74  AORTA Ao Root diam: 3.30 cm Ao Asc diam:  3.20 cm MITRAL VALVE MV Area (PHT): 3.74 cm    SHUNTS MV Decel Time: 203 msec    Systemic VTI:  0.13 m MV E velocity: 56.30 cm/s  Systemic Diam: 2.30 cm MV A velocity: 68.60 cm/s MV E/A ratio:  0.82 Charlton Haws MD Electronically signed by Charlton Haws MD Signature Date/Time: 03/12/2022/3:59:48 PM    Final    VAS [place] LOWER EXTREMITY VENOUS (DVT)  Result Date: 03/12/2022  Lower Venous DVT Study Patient Name:  [facility]  Date of Exam:   03/12/2022 Medical Rec #: [id number]       Accession #:    [id number] Date of Birth: 01/11/1961       Patient Gender: M Patient Age:   71 years Exam Location:  [facility] Procedure:      VAS [place] LOWER EXTREMITY VENOUS (DVT) Referring Phys: Lyda Perone --------------------------------------------------------------------------------  Indications: Pulmonary embolism.  Comparison Study: Prior negative right LEV done 07/13/20 Performing Technologist: Sherren Kerns RVS  Examination Guidelines: A complete evaluation includes B-mode imaging, spectral Doppler, color Doppler, and power Doppler as needed of all accessible portions of each vessel. Bilateral testing is considered an integral part  of a complete examination. Limited examinations for reoccurring indications may be performed as noted. The reflux portion of the exam is performed with the patient in reverse Trendelenburg.  +---------+---------------+---------+-----------+---------------+-------------+ RIGHT    CompressibilityPhasicitySpontaneityProperties     Thrombus                                                                 Aging         +---------+---------------+---------+-----------+---------------+-------------+ CFV      Full                               pulsatile                                                                waveforms                    +---------+---------------+---------+-----------+---------------+-------------+ SFJ      Full                                                            +---------+---------------+---------+-----------+---------------+-------------+ FV Prox  Full                                                            +---------+---------------+---------+-----------+---------------+-------------+  FV Mid   Full                                                            +---------+---------------+---------+-----------+---------------+-------------+ FV DistalFull                                                            +---------+---------------+---------+-----------+---------------+-------------+ PFV      Full                                                            +---------+---------------+---------+-----------+---------------+-------------+ POP      Full                               pulsatile                                                                waveforms                    +---------+---------------+---------+-----------+---------------+-------------+ PTV      Full                                                             +---------+---------------+---------+-----------+---------------+-------------+ PERO     Full                                                            +---------+---------------+---------+-----------+---------------+-------------+ Soleal   Full                                                            +---------+---------------+---------+-----------+---------------+-------------+ Gastroc  Full                                                            +---------+---------------+---------+-----------+---------------+-------------+   +---------+---------------+---------+-----------+---------------+-------------+ LEFT     CompressibilityPhasicitySpontaneityProperties     Thrombus  Aging         +---------+---------------+---------+-----------+---------------+-------------+ CFV      Full                               pulsatile                                                                waveforms                    +---------+---------------+---------+-----------+---------------+-------------+ SFJ      Full                                                            +---------+---------------+---------+-----------+---------------+-------------+ FV Prox  Full                                                            +---------+---------------+---------+-----------+---------------+-------------+ FV Mid   Full                                                            +---------+---------------+---------+-----------+---------------+-------------+ FV DistalPartial                                           Acute         +---------+---------------+---------+-----------+---------------+-------------+ PFV      Full                                                            +---------+---------------+---------+-----------+---------------+-------------+ POP      Partial                             pulsatile      Acute                                                     waveforms                    +---------+---------------+---------+-----------+---------------+-------------+ PTV      Full                                                            +---------+---------------+---------+-----------+---------------+-------------+  PERO     Full                                                            +---------+---------------+---------+-----------+---------------+-------------+ Soleal   None                                              Acute         +---------+---------------+---------+-----------+---------------+-------------+ Gastroc  Full                                                            +---------+---------------+---------+-----------+---------------+-------------+     Summary: RIGHT: - There is no evidence of deep vein thrombosis in the lower extremity.  - No cystic structure found in the popliteal fossa.   *See table(s) above for measurements and observations. Electronically signed by Deitra Mayo MD on 03/12/2022 at 2:06:44 PM.    Final    CT Angio Chest Pulmonary Embolism (PE) W or WO Contrast  Result Date: 03/11/2022 CLINICAL DATA:  Pulmonary embolism (PE) suspected, high prob Pt presents with reports of lightheadedness and near syncope that started today while at work. EXAM: CT ANGIOGRAPHY CHEST WITH CONTRAST TECHNIQUE: Multidetector CT imaging of the chest was performed using the standard protocol during bolus administration of intravenous contrast. Multiplanar CT image reconstructions and MIPs were obtained to evaluate the vascular anatomy. RADIATION DOSE REDUCTION: This exam was performed according to the departmental dose-optimization program which includes automated exposure control, adjustment of the mA and/or kV according to patient size and/or use of iterative reconstruction technique. CONTRAST:  38mL OMNIPAQUE  IOHEXOL 350 MG/ML SOLN COMPARISON:  Chest XR, 03/11/2022 FINDINGS: Cardiovascular: *Satisfactory opacification of the pulmonary arteries to the segmental level. *Saddle pulmonary embolus with additional emboli extending into the inferior and superior lobar arteries. *CT evidence of RIGHT heart strain, with RV/LV ratio of 1.4. Additional leftward septal buckling. See key image. *Normal heart size. No pericardial effusion. Mediastinum/Nodes: No enlarged mediastinal, hilar, or axillary lymph nodes. Thyroid gland, trachea, and esophagus demonstrate no significant findings. Lungs/Pleura: Lungs are clear without focal consolidation, mass or suspicious pulmonary nodule. No pleural effusion or pneumothorax. Upper Abdomen: No acute abnormality. Postsurgical changes of Roux-en-Y gastric bypass. Musculoskeletal: No acute chest wall abnormality. No significant osseous findings. Review of the MIP images confirms the above findings. IMPRESSION: Examination is POSITIVE for acute saddle-type pulmonary emboli, with CT evidence of right heart strain (RV/LV Ratio = 1.4). This is consistent with at least submassive (intermediate risk) PE. The presence of right heart strain has been associated with an increased risk of morbidity and mortality. Please refer to the "Code PE Focused" order set in EPIC. These results will be called to the ordering clinician or representative by the Radiologist Assistant, and communication documented in the PACS or [employer]. Electronically Signed   By: Michaelle Birks M.D.   On: 03/11/2022 18:00   DG Chest 2 View  Result Date: 03/11/2022 CLINICAL DATA:  Shortness of breath. EXAM: CHEST - 2 VIEW  COMPARISON:  February 19, 2015. FINDINGS: The heart size and mediastinal contours are within normal limits. Both lungs are clear. The visualized skeletal structures are unremarkable. IMPRESSION: No active cardiopulmonary disease. Electronically Signed   By: Lupita Raider M.D.   On: 03/11/2022 14:54      Assessment & Plan:   Pulmonary embolism (HCC) - Continue Eliquis 5mg  twice daily  - No oxygen requirements  - Refer to hematology - Trend D-dimer  OSA on CPAP - Continue CPAP nightly    , NP 04/04/2022

## 2022-04-04 NOTE — Assessment & Plan Note (Addendum)
-  Continue Eliquis 5mg  twice daily  - No oxygen requirements  - Refer to hematology - Trend D-dimer

## 2022-04-04 NOTE — Assessment & Plan Note (Signed)
-  Continue CPAP nightly

## 2022-04-05 DIAGNOSIS — G4733 Obstructive sleep apnea (adult) (pediatric): Secondary | ICD-10-CM | POA: Diagnosis not present

## 2022-05-06 DIAGNOSIS — G4733 Obstructive sleep apnea (adult) (pediatric): Secondary | ICD-10-CM | POA: Diagnosis not present

## 2022-05-09 NOTE — Telephone Encounter (Signed)
Mychart message sent by pt:  Lourena Simmonds  [facility] ([place], NP)Just now (3:42 PM)    Good afternoon.  When I was in the hospital for my blood clot issue, the doctor at the hospital wrote me a medical note where I could work from home til the end of February.  That time is coming to end and I am feeling very good, however, my work requires a Producer, television/film/video note saying it is okay for me to return to the office before I can resume in office duties.  Can I please get a doctor's note emailed to me at [email or web address] saying it is okay to return to work on March 1st?   Thank you for your assistance.  My phone number is [phone] if there are any questions.   Much appreciated.    Sincerely,  [facility], please advise.

## 2022-05-10 ENCOUNTER — Encounter: Payer: Self-pay | Admitting: Emergency Medicine

## 2022-05-10 NOTE — Telephone Encounter (Signed)
We can write a letter to return to work as long as he does not have oxygen/requirements and is feeling well

## 2022-06-06 ENCOUNTER — Other Ambulatory Visit (INDEPENDENT_AMBULATORY_CARE_PROVIDER_SITE_OTHER): Payer: BC Managed Care – PPO

## 2022-06-06 ENCOUNTER — Other Ambulatory Visit: Payer: Self-pay

## 2022-06-06 ENCOUNTER — Telehealth: Payer: Self-pay | Admitting: *Deleted

## 2022-06-06 DIAGNOSIS — I10 Essential (primary) hypertension: Secondary | ICD-10-CM

## 2022-06-06 DIAGNOSIS — E785 Hyperlipidemia, unspecified: Secondary | ICD-10-CM | POA: Diagnosis not present

## 2022-06-06 DIAGNOSIS — E559 Vitamin D deficiency, unspecified: Secondary | ICD-10-CM | POA: Diagnosis not present

## 2022-06-06 LAB — LIPID PANEL
Cholesterol: 103 mg/dL (ref 0–200)
HDL: 37.8 mg/dL — ABNORMAL LOW (ref >39.00)
LDL Cholesterol: 46 mg/dL (ref 0–99)
NonHDL: 65.51
Total CHOL/HDL Ratio: 3
Triglycerides: 97 mg/dL (ref 0.0–149.0)
VLDL: 19.4 mg/dL (ref 0.0–40.0)

## 2022-06-06 LAB — COMPREHENSIVE METABOLIC PANEL
ALT: 17 U/L (ref 0–53)
AST: 14 U/L (ref 0–37)
Albumin: 3.4 g/dL — ABNORMAL LOW (ref 3.5–5.2)
Alkaline Phosphatase: 66 U/L (ref 39–117)
BUN: 11 mg/dL (ref 6–23)
CO2: 24 mEq/L (ref 19–32)
Calcium: 8.3 mg/dL — ABNORMAL LOW (ref 8.4–10.5)
Chloride: 106 mEq/L (ref 96–112)
Creatinine, Ser: 1.03 mg/dL (ref 0.40–1.50)
GFR: 79.2 mL/min (ref >60.00)
Glucose, Bld: 163 mg/dL — ABNORMAL HIGH (ref 70–99)
Potassium: 3.4 mEq/L — ABNORMAL LOW (ref 3.5–5.1)
Sodium: 141 mEq/L (ref 135–145)
Total Bilirubin: 0.6 mg/dL (ref 0.2–1.2)
Total Protein: 6 g/dL (ref 6.0–8.3)

## 2022-06-06 LAB — CBC WITH DIFFERENTIAL/PLATELET
Basophils Absolute: 0.1 10*3/uL (ref 0.0–0.1)
Basophils Relative: 1.7 % (ref 0.0–3.0)
Eosinophils Absolute: 0.2 10*3/uL (ref 0.0–0.7)
Eosinophils Relative: 3.1 % (ref 0.0–5.0)
HCT: 41.6 % (ref 39.0–52.0)
Hemoglobin: 13.5 g/dL (ref 13.0–17.0)
Lymphocytes Relative: 23 % (ref 12.0–46.0)
Lymphs Abs: 1.6 10*3/uL (ref 0.7–4.0)
MCHC: 32.6 g/dL (ref 30.0–36.0)
MCV: 89.6 fl (ref 78.0–100.0)
Monocytes Absolute: 0.7 10*3/uL (ref 0.1–1.0)
Monocytes Relative: 9.7 % (ref 3.0–12.0)
Neutro Abs: 4.4 10*3/uL (ref 1.4–7.7)
Neutrophils Relative %: 62.5 % (ref 43.0–77.0)
Platelets: 309 10*3/uL (ref 150.0–400.0)
RBC: 4.64 Mil/uL (ref 4.22–5.81)
RDW: 14.4 % (ref 11.5–15.5)
WBC: 7 10*3/uL (ref 4.0–10.5)

## 2022-06-06 LAB — VITAMIN D 25 HYDROXY (VIT D DEFICIENCY, FRACTURES): VITD: 21.96 ng/mL — ABNORMAL LOW (ref 30.00–100.00)

## 2022-06-06 NOTE — Telephone Encounter (Signed)
Pt came in for lab appointment this morning.  I did not see any future lab orders in EPIC.  Please place future orders if appropriate. (I drew 1 gold and 2 lavender tubes)

## 2022-06-06 NOTE — Telephone Encounter (Signed)
Labs ordered.

## 2022-06-07 ENCOUNTER — Other Ambulatory Visit: Payer: Self-pay

## 2022-06-07 ENCOUNTER — Other Ambulatory Visit (INDEPENDENT_AMBULATORY_CARE_PROVIDER_SITE_OTHER): Payer: BC Managed Care – PPO

## 2022-06-07 ENCOUNTER — Other Ambulatory Visit: Payer: Self-pay | Admitting: *Deleted

## 2022-06-07 DIAGNOSIS — R739 Hyperglycemia, unspecified: Secondary | ICD-10-CM

## 2022-06-07 LAB — HEMOGLOBIN A1C: Hgb A1c MFr Bld: 6 % (ref 4.6–6.5)

## 2022-06-07 MED ORDER — VITAMIN D (ERGOCALCIFEROL) 1.25 MG (50000 UNIT) PO CAPS: 50000.0000 [IU] | ORAL_CAPSULE | ORAL | 3 refills | Status: DC

## 2022-06-09 ENCOUNTER — Other Ambulatory Visit: Payer: Self-pay

## 2022-06-09 MED ORDER — VITAMIN D (ERGOCALCIFEROL) 1.25 MG (50000 UNIT) PO CAPS: 50000.0000 [IU] | ORAL_CAPSULE | ORAL | 3 refills | Status: DC

## 2022-06-21 ENCOUNTER — Other Ambulatory Visit (INDEPENDENT_AMBULATORY_CARE_PROVIDER_SITE_OTHER): Payer: BC Managed Care – PPO

## 2022-06-21 LAB — COMPREHENSIVE METABOLIC PANEL
ALT: 23 U/L (ref 0–53)
AST: 16 U/L (ref 0–37)
Albumin: 3.7 g/dL (ref 3.5–5.2)
Alkaline Phosphatase: 65 U/L (ref 39–117)
BUN: 14 mg/dL (ref 6–23)
CO2: 30 mEq/L (ref 19–32)
Calcium: 8.9 mg/dL (ref 8.4–10.5)
Chloride: 103 mEq/L (ref 96–112)
Creatinine, Ser: 0.96 mg/dL (ref 0.40–1.50)
GFR: 86.16 mL/min (ref >60.00)
Glucose, Bld: 97 mg/dL (ref 70–99)
Potassium: 3.9 mEq/L (ref 3.5–5.1)
Sodium: 138 mEq/L (ref 135–145)
Total Bilirubin: 0.7 mg/dL (ref 0.2–1.2)
Total Protein: 6.3 g/dL (ref 6.0–8.3)

## 2022-07-11 DIAGNOSIS — G4733 Obstructive sleep apnea (adult) (pediatric): Secondary | ICD-10-CM | POA: Diagnosis not present

## 2022-07-14 ENCOUNTER — Other Ambulatory Visit: Payer: Self-pay | Admitting: Family Medicine

## 2022-07-15 ENCOUNTER — Encounter: Payer: Self-pay | Admitting: *Deleted

## 2022-07-21 ENCOUNTER — Encounter: Payer: Self-pay | Admitting: Family Medicine

## 2022-07-25 ENCOUNTER — Ambulatory Visit: Payer: BC Managed Care – PPO | Admitting: Family Medicine

## 2022-07-25 ENCOUNTER — Encounter: Payer: Self-pay | Admitting: Family Medicine

## 2022-07-25 VITALS — BP 137/75 | HR 70 | Ht 72.0 in | Wt 332.0 lb

## 2022-07-25 DIAGNOSIS — Z7985 Long-term (current) use of injectable non-insulin antidiabetic drugs: Secondary | ICD-10-CM

## 2022-07-25 DIAGNOSIS — E1169 Type 2 diabetes mellitus with other specified complication: Secondary | ICD-10-CM | POA: Diagnosis not present

## 2022-07-25 DIAGNOSIS — Z6841 Body Mass Index (BMI) 40.0 and over, adult: Secondary | ICD-10-CM

## 2022-07-25 DIAGNOSIS — I10 Essential (primary) hypertension: Secondary | ICD-10-CM | POA: Diagnosis not present

## 2022-07-25 DIAGNOSIS — E1165 Type 2 diabetes mellitus with hyperglycemia: Secondary | ICD-10-CM | POA: Diagnosis not present

## 2022-07-25 DIAGNOSIS — R739 Hyperglycemia, unspecified: Secondary | ICD-10-CM

## 2022-07-25 DIAGNOSIS — E785 Hyperlipidemia, unspecified: Secondary | ICD-10-CM

## 2022-07-25 DIAGNOSIS — I2699 Other pulmonary embolism without acute cor pulmonale: Secondary | ICD-10-CM | POA: Diagnosis not present

## 2022-07-25 DIAGNOSIS — Z7984 Long term (current) use of oral hypoglycemic drugs: Secondary | ICD-10-CM

## 2022-07-25 DIAGNOSIS — E669 Obesity, unspecified: Secondary | ICD-10-CM

## 2022-07-25 NOTE — Assessment & Plan Note (Signed)
Well controlled with last A1c 6.0% Continue current medications - metformin and Mounjaro UTD on vaccines, eye exam, foot exam Discussed diet and exercise F/u in 3 months

## 2022-07-25 NOTE — Assessment & Plan Note (Signed)
Medication management: atorvastatin 10 mg daily  Lifestyle factors for lowering cholesterol include: Diet therapy - heart-healthy diet rich in fruits, veggies, fiber-rich whole grains, lean meats, chicken, fish (at least twice a week), fat-free or 1% dairy products; foods low in saturated/trans fats, cholesterol, sodium, and sugar. Mediterranean diet has shown to be very heart healthy. Regular exercise - recommend at least 30 minutes a day, 5 times per week Weight management  Labs stable in March

## 2022-07-25 NOTE — Assessment & Plan Note (Signed)
Currently on Eliquis and following with Pulmonology.  Per chart review, he was to see hematology, but states he hasn't heard from anyone. - Referral placed.

## 2022-07-25 NOTE — Progress Notes (Signed)
Established Patient Office Visit  Subjective   Patient ID: Jeffrey Molina, male    DOB: 12-Jul-1960  Age: 62 y.o. MRN: [id number]  Chief Complaint  Patient presents with   Medical Management of Chronic Issues    HPI  Patient is here for routine follow-up. No acute concerns.   Hyperlipidemia: - medications: atorvastatin 10 mg daily - compliance: good - medication SEs: none The ASCVD Risk score (Arnett DK, et al., 2019) failed to calculate for the following reasons:   The valid total cholesterol range is 130 to 320 mg/dL  PE: - Eliquis 5 mg BID - no symptoms. Following with pulmonology.  - per last note PE was unprovoked and they wanted her to see hematologist but no referral placed, so we will order today    Diabetes: - Checking glucose at home: no - Medications: metformin 500 mg daily, Mounjaro 2.5 mg weekly - Compliance: good - Microalbumin: today - Denies symptoms of hypoglycemia, polyuria, polydipsia, numbness extremities, foot ulcers/trauma, wounds that are not healing, medication side effects  Lab Results  Component Value Date   HGBA1C 6.0 06/07/2022   Wt Readings from Last 3 Encounters:  07/25/22 (!) 332 lb (150.6 kg)  04/01/22 (!) 337 lb 3.2 oz (153 kg)  03/17/22 (!) 341 lb 6.4 oz (154.9 kg)    Hypertension: - Medications: metoprolol succinate 25 mg daily - Compliance: good - Checking BP at home: no - Denies any SOB, recurrent headaches, CP, vision changes, LE edema, dizziness, palpitations, or medication side effects.   Vitamin D deficiency: - he was given high dose supplementation to start about a month ago, not yet finished      ROS All review of systems negative except what is listed in the HPI    Objective:     BP 137/75   Pulse 70   Ht 6' (1.829 m)   Wt (!) 332 lb (150.6 kg)   SpO2 99%   BMI 45.03 kg/m    Physical Exam Vitals reviewed.  Constitutional:      Appearance: Normal appearance.  Cardiovascular:     Rate and Rhythm:  Normal rate and regular rhythm.     Pulses: Normal pulses.     Heart sounds: Normal heart sounds.  Pulmonary:     Effort: Pulmonary effort is normal.     Breath sounds: Normal breath sounds.  Musculoskeletal:     Right lower leg: Edema present.     Left lower leg: Edema present.  Skin:    General: Skin is warm and dry.  Neurological:     Mental Status: He is alert and oriented to person, place, and time.  Psychiatric:        Mood and Affect: Mood normal.        Behavior: Behavior normal.        Thought Content: Thought content normal.        Judgment: Judgment normal.      No results found for any visits on 07/25/22.    The ASCVD Risk score (Arnett DK, et al., 2019) failed to calculate for the following reasons:   The valid total cholesterol range is 130 to 320 mg/dL    Assessment & Plan:   Problem List Items Addressed This Visit     HTN (hypertension) (Chronic)    Blood pressure is at goal for age and co-morbidities.   Recommendations: continue metoprolol  - BP goal <130/80 - monitor and log blood pressures at home - check around the same  time each day in a relaxed setting - Limit salt to <2000 mg/day - Follow DASH eating plan (heart healthy diet) - limit alcohol to 2 standard drinks per day for men and 1 per day for women - avoid tobacco products - get at least 2 hours of regular aerobic exercise weekly Patient aware of signs/symptoms requiring further/urgent evaluation.       Hyperlipidemia    Medication management: atorvastatin 10 mg daily  Lifestyle factors for lowering cholesterol include: Diet therapy - heart-healthy diet rich in fruits, veggies, fiber-rich whole grains, lean meats, chicken, fish (at least twice a week), fat-free or 1% dairy products; foods low in saturated/trans fats, cholesterol, sodium, and sugar. Mediterranean diet has shown to be very heart healthy. Regular exercise - recommend at least 30 minutes a day, 5 times per week Weight  management  Labs stable in March       Type 2 diabetes mellitus with obesity (HCC)    Well controlled with last A1c 6.0% Continue current medications - metformin and Mounjaro UTD on vaccines, eye exam, foot exam Discussed diet and exercise F/u in 3 months       Pulmonary embolism (HCC)    Currently on Eliquis and following with Pulmonology.  Per chart review, he was to see hematology, but states he hasn't heard from anyone. - Referral placed.      Relevant Orders   Ambulatory referral to Hematology / Oncology   Other Visit Diagnoses     Hyperglycemia    -  Primary   Relevant Orders   Urine Microalbumin w/creat. ratio       Return in about 3 months (around 10/25/2022) for routine follow-up.    Clayborne Dana, NP

## 2022-07-25 NOTE — Assessment & Plan Note (Signed)
Blood pressure is at goal for age and co-morbidities.   Recommendations: continue metoprolol  - BP goal <130/80 - monitor and log blood pressures at home - check around the same time each day in a relaxed setting - Limit salt to <2000 mg/day - Follow DASH eating plan (heart healthy diet) - limit alcohol to 2 standard drinks per day for men and 1 per day for women - avoid tobacco products - get at least 2 hours of regular aerobic exercise weekly Patient aware of signs/symptoms requiring further/urgent evaluation.

## 2022-07-25 NOTE — Patient Instructions (Signed)
Labs stable a few weeks ago. No changes today.  Adding a microalbumin urine test.  Referral to hematology per pulmonologist note - they will be calling you to schedule.

## 2022-07-26 LAB — MICROALBUMIN / CREATININE URINE RATIO
Creatinine,U: 156.8 mg/dL
Microalb Creat Ratio: 0.4 mg/g (ref 0.0–30.0)
Microalb, Ur: <0.7 mg/dL (ref 0.0–1.9)

## 2022-08-04 DIAGNOSIS — M170 Bilateral primary osteoarthritis of knee: Secondary | ICD-10-CM | POA: Diagnosis not present

## 2022-08-05 ENCOUNTER — Encounter: Payer: Self-pay | Admitting: Family

## 2022-08-05 ENCOUNTER — Inpatient Hospital Stay: Payer: BC Managed Care – PPO | Attending: Hematology & Oncology

## 2022-08-05 ENCOUNTER — Other Ambulatory Visit: Payer: Self-pay | Admitting: Family

## 2022-08-05 ENCOUNTER — Inpatient Hospital Stay: Payer: BC Managed Care – PPO | Admitting: Family

## 2022-08-05 VITALS — BP 139/81 | HR 70 | Temp 98.2°F | Resp 18 | Ht 72.0 in | Wt 329.0 lb

## 2022-08-05 DIAGNOSIS — I82462 Acute embolism and thrombosis of left calf muscular vein: Secondary | ICD-10-CM | POA: Diagnosis not present

## 2022-08-05 DIAGNOSIS — Z7901 Long term (current) use of anticoagulants: Secondary | ICD-10-CM | POA: Diagnosis not present

## 2022-08-05 DIAGNOSIS — I2609 Other pulmonary embolism with acute cor pulmonale: Secondary | ICD-10-CM

## 2022-08-05 DIAGNOSIS — Z808 Family history of malignant neoplasm of other organs or systems: Secondary | ICD-10-CM | POA: Insufficient documentation

## 2022-08-05 DIAGNOSIS — Z8719 Personal history of other diseases of the digestive system: Secondary | ICD-10-CM | POA: Insufficient documentation

## 2022-08-05 DIAGNOSIS — Z86718 Personal history of other venous thrombosis and embolism: Secondary | ICD-10-CM | POA: Diagnosis not present

## 2022-08-05 DIAGNOSIS — G4730 Sleep apnea, unspecified: Secondary | ICD-10-CM | POA: Diagnosis not present

## 2022-08-05 DIAGNOSIS — Z9884 Bariatric surgery status: Secondary | ICD-10-CM | POA: Diagnosis not present

## 2022-08-05 DIAGNOSIS — Z86711 Personal history of pulmonary embolism: Secondary | ICD-10-CM | POA: Diagnosis not present

## 2022-08-05 DIAGNOSIS — D6859 Other primary thrombophilia: Secondary | ICD-10-CM

## 2022-08-05 DIAGNOSIS — I2692 Saddle embolus of pulmonary artery without acute cor pulmonale: Secondary | ICD-10-CM

## 2022-08-05 LAB — CMP (CANCER CENTER ONLY)
ALT: 23 U/L (ref 0–44)
AST: 18 U/L (ref 15–41)
Albumin: 4.3 g/dL (ref 3.5–5.0)
Alkaline Phosphatase: 68 U/L (ref 38–126)
Anion gap: 6 (ref 5–15)
BUN: 13 mg/dL (ref 6–20)
CO2: 29 mmol/L (ref 22–32)
Calcium: 9.9 mg/dL (ref 8.9–10.3)
Chloride: 104 mmol/L (ref 98–111)
Creatinine: 0.96 mg/dL (ref 0.61–1.24)
GFR, Estimated: >60 mL/min (ref >60)
Glucose, Bld: 115 mg/dL — ABNORMAL HIGH (ref 70–99)
Potassium: 4.3 mmol/L (ref 3.5–5.1)
Sodium: 139 mmol/L (ref 135–145)
Total Bilirubin: 0.5 mg/dL (ref 0.3–1.2)
Total Protein: 7.5 g/dL (ref 6.5–8.1)

## 2022-08-05 LAB — CBC WITH DIFFERENTIAL (CANCER CENTER ONLY)
Abs Immature Granulocytes: 0.02 10*3/uL (ref 0.00–0.07)
Basophils Absolute: 0.1 10*3/uL (ref 0.0–0.1)
Basophils Relative: 1 %
Eosinophils Absolute: 0.1 10*3/uL (ref 0.0–0.5)
Eosinophils Relative: 2 %
HCT: 46 % (ref 39.0–52.0)
Hemoglobin: 14.7 g/dL (ref 13.0–17.0)
Immature Granulocytes: 0 %
Lymphocytes Relative: 16 %
Lymphs Abs: 1.2 10*3/uL (ref 0.7–4.0)
MCH: 28.5 pg (ref 26.0–34.0)
MCHC: 32 g/dL (ref 30.0–36.0)
MCV: 89.3 fL (ref 80.0–100.0)
Monocytes Absolute: 0.7 10*3/uL (ref 0.1–1.0)
Monocytes Relative: 8 %
Neutro Abs: 5.8 10*3/uL (ref 1.7–7.7)
Neutrophils Relative %: 73 %
Platelet Count: 298 10*3/uL (ref 150–400)
RBC: 5.15 MIL/uL (ref 4.22–5.81)
RDW: 13.8 % (ref 11.5–15.5)
WBC Count: 8 10*3/uL (ref 4.0–10.5)
nRBC: 0 % (ref 0.0–0.2)

## 2022-08-05 LAB — ANTITHROMBIN III: AntiThromb III Func: 100 % (ref 75–120)

## 2022-08-05 LAB — LACTATE DEHYDROGENASE: LDH: 113 U/L (ref 98–192)

## 2022-08-05 LAB — D-DIMER, QUANTITATIVE: D-Dimer, Quant: 0.27 ug/mL-FEU (ref 0.00–0.50)

## 2022-08-05 NOTE — Progress Notes (Signed)
Hematology/Oncology Consultation   Name: Jeffrey Molina      MRN: [id number]    Location: Room/bed info not found  Date: 08/05/2022 Time:3:22 PM   REFERRING PHYSICIAN:  Hyman Hopes, NP  REASON FOR CONSULT: Pulmonary embolism    DIAGNOSIS: Pulmonary embolism and LLE DVT  HISTORY OF PRESENT ILLNESS: Mr. Mcevoy is a very pleasant 62 yo caucasian gentleman with diagnosis of PE and LLE DVT in December 2023 after experiencing persistent SOB and fatigue. He has no prior history of thrombotic event.  No known familial history of thrombotic event.  He was treated with Heparin during admission and then transitioned to Eliquis at discharge.  He has tolerated Eliquis nicely. No blood loss noted. No bruising or petechiae.  He denies any illness, injury or travel prior to diagnosis.  No smoking or recreational drug use. Rare ETOH socially.  No hormone replacement therapy use.  He has chronic swelling in both lower extremities due to PAD and is a little more pronounced in the LLE with DVT. Pedal pulses are 1 +.  Her has intermittent tingling in his feet.  No falls or syncope reported.  He is borderline diabetic and currently on Metformin and Mounjaro.  He is working to lose weight so that he can have knee replacement surgery. He states that both knees have no cartilage and he received steroid injections in both yesterday with ortho.  He has sleep apnea and wears a CPAP at night. He has trouble staying asleep.  No history of thyroid disease.  He has history of colonic polyps and is overdue no for colonoscopy. This has been put on hold until his PE/DVT have been address.  No personal history of cancer. Both of his parents have had skin cancer.  He has gastric bypass surgery in 2016 without any complications.  No fever, chills, n/v, cough, rash, dizziness, SOB, chest pain, palpitations, abdominal pain or changes in bowel or bladder habits.  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   ROS: All  other 10 point review of systems is negative.   PAST MEDICAL HISTORY:   Past Medical History:  Diagnosis Date   Anemia 10/28/2013   pt. stated "no" at preop appt. 08-29-14   Arthritis    both knees   Chicken pox as a child   Cutaneous skin tags 10/28/2013   Diabetes mellitus type 2 in obese 01/27/2014   GERD (gastroesophageal reflux disease)    History of chicken pox    Hypertension    Low testosterone 10/28/2013   Peripheral artery disease (HCC) 11/03/2013   Preventative health care 10/26/2016   Sleep apnea    has c-pap machine   Tachycardia 05/15/2014    ALLERGIES: Allergies  Allergen Reactions   Lisinopril-Hydrochlorothiazide Swelling    Lip swollen      MEDICATIONS:  Current Outpatient Medications on File Prior to Visit  Medication Sig Dispense Refill   apixaban (ELIQUIS) 5 MG TABS tablet Eliquis 10 mg po bid for 1 week, then 5 mg po bid (Patient taking differently: Take 5 mg by mouth 2 (two) times daily. Eliquis 10 mg po bid for 1 week, then 5 mg po bid) 180 tablet 1   atorvastatin (LIPITOR) 10 MG tablet Take 1 tablet (10 mg total) by mouth daily. 90 tablet 3   Calcium Carb-Cholecalciferol (CALCIUM 600 + D PO) Take 1 tablet by mouth daily.     diphenhydrAMINE HCl, Sleep, (ZZZQUIL) 50 MG/30ML LIQD Take by mouth at bedtime as needed.  metFORMIN (GLUCOPHAGE) 500 MG tablet Take 1 tablet (500 mg total) by mouth daily with breakfast. 30 tablet 3   Methylcobalamin (B-12) 5000 MCG TBDP Take 1 tablet by mouth daily.     metoprolol succinate (TOPROL-XL) 25 MG 24 hr tablet Take 1 tablet (25 mg total) by mouth daily. 90 tablet 1   metoprolol tartrate (LOPRESSOR) 25 MG tablet TAKE ONE-HALF (1/2) TABLET TWICE A DAY (Patient taking differently: Take 12.5 mg by mouth daily.) 90 tablet 3   Multiple Vitamin (MULTIVITAMIN WITH MINERALS) TABS tablet Take 1 tablet by mouth 2 (two) times daily.     Probiotic Product (PROBIOTIC & ACIDOPHILUS EX ST PO) Take 1 tablet by mouth daily.     sildenafil  (VIAGRA) 50 MG tablet Take 1 tablet (50 mg total) by mouth daily as needed for erectile dysfunction. 30 tablet 0   tirzepatide (MOUNJARO) 2.5 MG/0.5ML Pen INJECT 2.5 MG SUBCUTANEOUSLY WEEKLY 2 mL 0   Vitamin D, Ergocalciferol, (DRISDOL) 1.25 MG (50000 UNIT) CAPS capsule Take 1 capsule (50,000 Units total) by mouth every 7 (seven) days. 5 capsule 3   No current facility-administered medications on file prior to visit.     PAST SURGICAL HISTORY Past Surgical History:  Procedure Laterality Date   colonscopy     x 2   GASTRIC ROUX-EN-Y N/A 09/02/2014   Procedure: LAPAROSCOPIC ROUX-EN-Y GASTRIC BYPASS WITH UPPER ENDOSCOPY;  Surgeon: Gaynelle Adu, MD;  Location: [facility];  Service: General;  Laterality: N/A;    FAMILY HISTORY: Family History  Problem Relation Age of Onset   Fibromyalgia Mother    Cancer Father        skin cancer   Cataracts Father    Arthritis Father    Diabetes Maternal Grandfather     SOCIAL HISTORY:  reports that he has never smoked. He has never used smokeless tobacco. He reports that he does not drink alcohol and does not use drugs.  PERFORMANCE STATUS: The patient's performance status is 1 - Symptomatic but completely ambulatory  PHYSICAL EXAM: Most Recent Vital Signs: Blood pressure 139/81, pulse 70, temperature 98.2 F (36.8 C), temperature source Oral, resp. rate 18, height 6' (1.829 m), weight (!) 329 lb (149.2 kg), SpO2 100 %. BP 139/81 (BP Location: Left Arm, Patient Position: Sitting)   Pulse 70   Temp 98.2 F (36.8 C) (Oral)   Resp 18   Ht 6' (1.829 m)   Wt (!) 329 lb (149.2 kg)   SpO2 100%   BMI 44.62 kg/m   General Appearance:    Alert, cooperative, no distress, appears stated age  Head:    Normocephalic, without obvious abnormality, atraumatic  Eyes:    PERRL, conjunctiva/corneas clear, EOM's intact, fundi    benign, both eyes             Throat:   Lips, mucosa, and tongue normal; teeth and gums normal  Neck:   Supple, symmetrical, trachea  midline, no adenopathy;       thyroid:  No enlargement/tenderness/nodules; no carotid   bruit or JVD  Back:     Symmetric, no curvature, ROM normal, no CVA tenderness  Lungs:     Clear to auscultation bilaterally, respirations unlabored  Chest wall:    No tenderness or deformity  Heart:    Regular rate and rhythm, S1 and S2 normal, no murmur, rub   or gallop  Abdomen:     Soft, non-tender, bowel sounds active all four quadrants,    no masses, no organomegaly  Extremities:   Extremities normal, atraumatic, no cyanosis or edema  Pulses:   2+ and symmetric all extremities  Skin:   Skin color, texture, turgor normal, no rashes or lesions  Lymph nodes:   Cervical, supraclavicular, and axillary nodes normal  Neurologic:   CNII-XII intact. Normal strength, sensation and reflexes      throughout    LABORATORY DATA:  Results for orders placed or performed in visit on 08/05/22 (from the past 48 hour(s))  CBC with Differential (Cancer Center Only)     Status: None   Collection Time: 08/05/22  2:36 PM  Result Value Ref Range   WBC Count 8.0 4.0 - 10.5 K/uL   RBC 5.15 4.22 - 5.81 MIL/uL   Hemoglobin 14.7 13.0 - 17.0 g/dL   HCT 16.1 09.6 - 04.5 %   MCV 89.3 80.0 - 100.0 fL   MCH 28.5 26.0 - 34.0 pg   MCHC 32.0 30.0 - 36.0 g/dL   RDW 40.9 81.1 - 91.4 %   Platelet Count 298 150 - 400 K/uL   nRBC 0.0 0.0 - 0.2 %   Neutrophils Relative % 73 %   Neutro Abs 5.8 1.7 - 7.7 K/uL   Lymphocytes Relative 16 %   Lymphs Abs 1.2 0.7 - 4.0 K/uL   Monocytes Relative 8 %   Monocytes Absolute 0.7 0.1 - 1.0 K/uL   Eosinophils Relative 2 %   Eosinophils Absolute 0.1 0.0 - 0.5 K/uL   Basophils Relative 1 %   Basophils Absolute 0.1 0.0 - 0.1 K/uL   Immature Granulocytes 0 %   Abs Immature Granulocytes 0.02 0.00 - 0.07 K/uL    Comment: Performed at [facility] at [facility], [address] [place], [place] [address]  CMP (Cancer Center only)     Status: Abnormal    Collection Time: 08/05/22  2:36 PM  Result Value Ref Range   Sodium 139 135 - 145 mmol/L   Potassium 4.3 3.5 - 5.1 mmol/L   Chloride 104 98 - 111 mmol/L   CO2 29 22 - 32 mmol/L   Glucose, Bld 115 (H) 70 - 99 mg/dL    Comment: Glucose reference range applies only to samples taken after fasting for at least 8 hours.   BUN 13 6 - 20 mg/dL   Creatinine 6.21 3.08 - 1.24 mg/dL   Calcium 9.9 8.9 - 65.7 mg/dL   Total Protein 7.5 6.5 - 8.1 g/dL   Albumin 4.3 3.5 - 5.0 g/dL   AST 18 15 - 41 U/L   ALT 23 0 - 44 U/L   Alkaline Phosphatase 68 38 - 126 U/L   Total Bilirubin 0.5 0.3 - 1.2 mg/dL   GFR, Estimated >84 >69 mL/min    Comment: (NOTE) Calculated using the CKD-EPI Creatinine Equation (2021)    Anion gap 6 5 - 15    Comment: Performed at [facility] at [facility], [address], [place], [place] [address]  D-dimer, quantitative     Status: None   Collection Time: 08/05/22  2:36 PM  Result Value Ref Range   D-Dimer, Quant 0.27 0.00 - 0.50 ug/mL-FEU    Comment: (NOTE) At the manufacturer cut-off value of 0.5 g/mL FEU, this assay has a negative predictive value of 95-100%.This assay is intended for use in conjunction with a clinical pretest probability (PTP) assessment model to exclude pulmonary embolism (PE) and deep venous thrombosis (DVT) in outpatients suspected of PE  or DVT. Results should be correlated with clinical presentation. Performed at [facility], [address] [place], [place] [address]       RADIOGRAPHY: No results found.     PATHOLOGY: None  ASSESSMENT/PLAN: Mr. Mohl is a very pleasant 62 yo caucasian gentleman with diagnosis of PE and LLE DVT in December 2023. He is doing well on Eliquis and will continue his same regimen.  Hyper coag panel is pending.  We will repeat his CT angio and LLE [place] week after next as this is better for the patient' work schedule.  Follow-up in 3 months.   All questions were answered.  The patient knows to call the clinic with any problems, questions or concerns. We can certainly see the patient much sooner if necessary.  The patient was discussed with Dr. Myna Hidalgo and he is in agreement with the aforementioned.   Eileen Stanford, NP

## 2022-08-06 LAB — LUPUS ANTICOAGULANT PANEL
DRVVT: 53.8 s — ABNORMAL HIGH (ref 0.0–47.0)
PTT Lupus Anticoagulant: 33 s (ref 0.0–43.5)

## 2022-08-06 LAB — DRVVT CONFIRM: dRVVT Confirm: 1 ratio (ref 0.8–1.2)

## 2022-08-06 LAB — BETA-2-GLYCOPROTEIN I ABS, IGG/M/A
Beta-2 Glyco I IgG: <9 GPI IgG units (ref 0–20)
Beta-2-Glycoprotein I IgA: <9 GPI IgA units (ref 0–25)
Beta-2-Glycoprotein I IgM: <9 GPI IgM units (ref 0–32)

## 2022-08-06 LAB — HOMOCYSTEINE: Homocysteine: 8.8 umol/L (ref 0.0–14.5)

## 2022-08-06 LAB — PROTEIN C ACTIVITY: Protein C Activity: 104 % (ref 73–180)

## 2022-08-06 LAB — PROTEIN S, TOTAL: Protein S Ag, Total: 81 % (ref 60–150)

## 2022-08-06 LAB — PROTEIN S ACTIVITY: Protein S Activity: 87 % (ref 63–140)

## 2022-08-06 LAB — DRVVT MIX: dRVVT Mix: 44.7 s — ABNORMAL HIGH (ref 0.0–40.4)

## 2022-08-07 LAB — PROTEIN C, TOTAL: Protein C, Total: 103 % (ref 60–150)

## 2022-08-08 LAB — CARDIOLIPIN ANTIBODIES, IGG, IGM, IGA
Anticardiolipin IgA: <9 APL U/mL (ref 0–11)
Anticardiolipin IgG: <9 GPL U/mL (ref 0–14)
Anticardiolipin IgM: <9 MPL U/mL (ref 0–12)

## 2022-08-10 ENCOUNTER — Other Ambulatory Visit: Payer: Self-pay | Admitting: Family Medicine

## 2022-08-10 DIAGNOSIS — G4733 Obstructive sleep apnea (adult) (pediatric): Secondary | ICD-10-CM | POA: Diagnosis not present

## 2022-08-17 ENCOUNTER — Encounter: Payer: Self-pay | Admitting: *Deleted

## 2022-08-17 ENCOUNTER — Ambulatory Visit (HOSPITAL_BASED_OUTPATIENT_CLINIC_OR_DEPARTMENT_OTHER)
Admission: RE | Admit: 2022-08-17 | Discharge: 2022-08-17 | Disposition: A | Discharge status: Home / Self Care | Payer: BC Managed Care – PPO | Source: Ambulatory Visit | Attending: Family | Admitting: Family

## 2022-08-17 ENCOUNTER — Ambulatory Visit (HOSPITAL_BASED_OUTPATIENT_CLINIC_OR_DEPARTMENT_OTHER): Payer: BC Managed Care – PPO

## 2022-08-17 ENCOUNTER — Encounter (HOSPITAL_BASED_OUTPATIENT_CLINIC_OR_DEPARTMENT_OTHER): Payer: Self-pay

## 2022-08-17 DIAGNOSIS — I82462 Acute embolism and thrombosis of left calf muscular vein: Secondary | ICD-10-CM | POA: Diagnosis not present

## 2022-08-17 DIAGNOSIS — Z0389 Encounter for observation for other suspected diseases and conditions ruled out: Secondary | ICD-10-CM | POA: Diagnosis not present

## 2022-08-17 DIAGNOSIS — I2609 Other pulmonary embolism with acute cor pulmonale: Secondary | ICD-10-CM | POA: Insufficient documentation

## 2022-08-17 MED ORDER — IOHEXOL 350 MG/ML SOLN
100.0000 mL | Freq: Once | INTRAVENOUS | Status: AC | PRN
  Administered 2022-08-17: 100 mL via INTRAVENOUS

## 2022-08-19 LAB — PROTHROMBIN GENE MUTATION

## 2022-08-25 DIAGNOSIS — Z86711 Personal history of pulmonary embolism: Secondary | ICD-10-CM | POA: Diagnosis not present

## 2022-08-25 DIAGNOSIS — Z86718 Personal history of other venous thrombosis and embolism: Secondary | ICD-10-CM | POA: Diagnosis not present

## 2022-08-25 DIAGNOSIS — Z86010 Personal history of colonic polyps: Secondary | ICD-10-CM | POA: Diagnosis not present

## 2022-08-26 LAB — FACTOR 5 LEIDEN

## 2022-08-29 ENCOUNTER — Telehealth: Payer: Self-pay

## 2022-08-29 NOTE — Telephone Encounter (Signed)
-----   Message from Erenest Blank, NP sent at 08/29/2022  9:20 AM EDT ----- No underlying reason to clot fount on hyper coag panel!!! Continue same regimen with anticoagulant. We will see him at follow-up.      ----- Message ----- From: Interface, Lab In [place] Sent: 08/05/2022   2:50 PM EDT To: Erenest Blank, NP

## 2022-09-10 DIAGNOSIS — G4733 Obstructive sleep apnea (adult) (pediatric): Secondary | ICD-10-CM | POA: Diagnosis not present

## 2022-09-30 ENCOUNTER — Ambulatory Visit: Payer: BC Managed Care – PPO | Admitting: Primary Care

## 2022-10-03 ENCOUNTER — Encounter: Payer: Self-pay | Admitting: Primary Care

## 2022-10-03 ENCOUNTER — Ambulatory Visit: Payer: BC Managed Care – PPO | Admitting: Primary Care

## 2022-10-03 VITALS — BP 114/64 | HR 84 | Temp 98.2°F | Ht 72.0 in | Wt 316.6 lb

## 2022-10-03 DIAGNOSIS — G4733 Obstructive sleep apnea (adult) (pediatric): Secondary | ICD-10-CM | POA: Diagnosis not present

## 2022-10-03 DIAGNOSIS — Z01811 Encounter for preprocedural respiratory examination: Secondary | ICD-10-CM | POA: Insufficient documentation

## 2022-10-03 NOTE — Progress Notes (Signed)
@Patient  ID: Jeffrey Molina, male    DOB: 1960-09-06, 62 y.o.   MRN: [id number]  Chief Complaint  Patient presents with   Follow-up    Doing well.      Referring provider: Bradd Canary, MD  HPI: 62 year old male, never smoked.  Past medical history significant for hypertension, OSA on CPAP, type 2 diabetes, hyperlipidemia, morbid obesity s/p gastric bypass.   Previous [facility] pulmonary encounter: 07/08/2021 Patient presents today for sleep consult. Patient has hx severe sleep apnea. Needs prescription for new CPAP machine d/t [facility] recall. He continues to use current CPAP machine and reports 100% compliance. There was no data on SD card. He has a Sports administrator. [facility] will be sending replacement machine. He does not currently have a DME company, he orders his supplies online through [employer].   Sleep questionnaire Symptoms-  Current CPAP recalled, needs new prescription  Prior sleep study- 11/28/2004 NPSG>> AHI 122.5/hr, mean saturation 77% on room air / Weight 465lb  Bedtime- 10:30-11:30pm Time to fall asleep- 5 minutes  Nocturnal awakenings- 2-3 times  Out of bed/start of day- 6:25am Weight changes- Patient had gastric bypass down 100 lbs since sleep study, weight +15 lbs last 2 years  Do you operate heavy machinery- No Do you currently wear CPAP- Yes Do you current wear oxygen- No Epworth- 3  08/11/2021 Patient presents today for OSA follow-up/compliance check. He received replacement CPAP from [facility] since our last visit, we spoke with one of our local DME companies and they told us that despite him getting a replacement CPAP machine it is so old that they are not able to access download or provide replacement parts or SD card. They recommend he get a new CPAP machine with them. He gets his CPAP supplies through [employer]. Partial download from March 2017 showed that he was using pressure 17cm h20.   11/16/2021  Patient presents today for 3 month follow-up/OSA.   During last visit in May patient was ordered for new CPAP machine with [facility].  Pressure setting was 17 cm H2O, recommended auto titrate 5 to 20 cm H2O. He is 97% compliant with CPAP use.  No issues with mask fit or pressure settings.  He received a new CPAP machine since our last visit and it is working well for him. He does report fragmented sleep due to nocturia.  He has no trouble falling back to sleep.  No residual daytime sleepiness.   Airview download 10/16/2021 - 11/14/2021 29/30 days; 97% greater than 4 hours Average usage 6 hours 54 minutes Pressure 5 to 20 cm H2O (12.4 cm H2O-95%) Air leaks 10.3 L/min (95%) AHI 1.4  04/01/2022 Patient presents today for hospital follow-up. Follows with our office for OSA.    No signs of bleeding  SpO2 low on ambulation was 88%, maintained O2 90-91% RA  Echo without significant RH strain LE dopplers with LLE DVT PE is considered unprovoked  Needs follow-up with Hematology    Airview download 01/01/22-03/31/22 Usage 90/90 days; 89 days (99%) Usage 6 hours 41 mins Pressure 5-20cm h20 (12.3cm - 95%) Airleaks 17.1L/min (95%) AHI 1.3    10/03/2022- interim hx  Patient presents today for CPAP/OSA overview. He is doing well without acute complaints.  He is 97% compliant with CPAP use > 4 hours last 30 days. Current pressure 5 to 20 cm H2O.  No issues with mask fit or pressure settings. He remains on Eliquis for hx DVT/PE. CTA in May was negative for pulmonary embolism. Lungs clear.  Following with hematology. Planning for upcoming left knee surgery, no date set yet.   Airview download 612/24-7/11/24 Usage 30/30 days (100%); 29 days (97%) > 4 hours Average usage 6 hours 21 mins Pressure 5-20cm h20 (10.3cm h20)  Airleaks 28.6L/min  AHI 1.4   Allergies  Allergen Reactions   Lisinopril-Hydrochlorothiazide Swelling    Lip swollen    Immunization History  Administered Date(s) Administered   COVID-19, mRNA, vaccine(Comirnaty)12 years and older  02/19/2022   Influenza,inj,Quad PF,6+ Mos 02/19/2022   Influenza-Unspecified 01/20/2014, 11/21/2014, 02/03/2020   PFIZER Comirnaty(Gray Top)Covid-19 Tri-Sucrose Vaccine 06/05/2019, 07/01/2019, 02/20/2020   Zoster Recombinant(Shingrix) 04/16/2020, 07/13/2020    Past Medical History:  Diagnosis Date   Anemia 10/28/2013   pt. stated "no" at preop appt. 08-29-14   Arthritis    both knees   Chicken pox as a child   Cutaneous skin tags 10/28/2013   Diabetes mellitus type 2 in obese 01/27/2014   GERD (gastroesophageal reflux disease)    History of chicken pox    Hypertension    Low testosterone 10/28/2013   Peripheral artery disease (HCC) 11/03/2013   Preventative health care 10/26/2016   Sleep apnea    has c-pap machine   Tachycardia 05/15/2014    Tobacco History: Social History   Tobacco Use  Smoking Status Never  Smokeless Tobacco Never   Counseling given: Not Answered   Outpatient Medications Prior to Visit  Medication Sig Dispense Refill   apixaban (ELIQUIS) 5 MG TABS tablet Eliquis 10 mg po bid for 1 week, then 5 mg po bid (Patient taking differently: Take 5 mg by mouth 2 (two) times daily. Eliquis 10 mg po bid for 1 week, then 5 mg po bid) 180 tablet 1   atorvastatin (LIPITOR) 10 MG tablet Take 1 tablet (10 mg total) by mouth daily. 90 tablet 3   Calcium Carb-Cholecalciferol (CALCIUM 600 + D PO) Take 1 tablet by mouth daily.     diphenhydrAMINE HCl, Sleep, (ZZZQUIL) 50 MG/30ML LIQD Take by mouth at bedtime as needed.     metFORMIN (GLUCOPHAGE) 500 MG tablet Take 1 tablet (500 mg total) by mouth daily with breakfast. 30 tablet 3   Methylcobalamin (B-12) 5000 MCG TBDP Take 1 tablet by mouth daily.     metoprolol succinate (TOPROL-XL) 25 MG 24 hr tablet Take 1 tablet (25 mg total) by mouth daily. 90 tablet 1   metoprolol tartrate (LOPRESSOR) 25 MG tablet TAKE ONE-HALF (1/2) TABLET TWICE A DAY (Patient taking differently: Take 12.5 mg by mouth daily.) 90 tablet 3   Multiple Vitamin  (MULTIVITAMIN WITH MINERALS) TABS tablet Take 1 tablet by mouth 2 (two) times daily.     Probiotic Product (PROBIOTIC & ACIDOPHILUS EX ST PO) Take 1 tablet by mouth daily.     sildenafil (VIAGRA) 50 MG tablet Take 1 tablet (50 mg total) by mouth daily as needed for erectile dysfunction. 30 tablet 0   tirzepatide (MOUNJARO) 2.5 MG/0.5ML Pen INJECT 2.5 MG SUBCUTANEOUSLY WEEKLY 0.5 mL 2   Vitamin D, Ergocalciferol, (DRISDOL) 1.25 MG (50000 UNIT) CAPS capsule Take 1 capsule (50,000 Units total) by mouth every 7 (seven) days. 5 capsule 3   No facility-administered medications prior to visit.    Review of Systems  Review of Systems  Constitutional: Negative.   Respiratory: Negative.    Cardiovascular: Negative.    Physical Exam  BP 114/64 (BP Location: Left Arm, Patient Position: Sitting, Cuff Size: Large)   Pulse 84   Temp 98.2 F (36.8 C) (Oral)  Ht 6' (1.829 m)   Wt (!) 316 lb 9.6 oz (143.6 kg)   SpO2 97%   BMI 42.94 kg/m  Physical Exam Constitutional:      General: He is not in acute distress.    Appearance: Normal appearance. He is not ill-appearing.  HENT:     Head: Normocephalic and atraumatic.     Mouth/Throat:     Mouth: Mucous membranes are moist.     Pharynx: Oropharynx is clear.  Cardiovascular:     Rate and Rhythm: Normal rate and regular rhythm.  Pulmonary:     Effort: Pulmonary effort is normal.     Breath sounds: Normal breath sounds.  Musculoskeletal:        General: Normal range of motion.  Skin:    General: Skin is warm and dry.  Neurological:     General: No focal deficit present.     Mental Status: He is alert and oriented to person, place, and time. Mental status is at baseline.  Psychiatric:        Mood and Affect: Mood normal.        Behavior: Behavior normal.        Thought Content: Thought content normal.        Judgment: Judgment normal.      Lab Results:  CBC    Component Value Date/Time   WBC 8.0 08/05/2022 1436   WBC 7.0  06/06/2022 1130   RBC 5.15 08/05/2022 1436   HGB 14.7 08/05/2022 1436   HCT 46.0 08/05/2022 1436   PLT 298 08/05/2022 1436   MCV 89.3 08/05/2022 1436   MCV 93.5 05/26/2013 1040   MCH 28.5 08/05/2022 1436   MCHC 32.0 08/05/2022 1436   RDW 13.8 08/05/2022 1436   LYMPHSABS 1.2 08/05/2022 1436   MONOABS 0.7 08/05/2022 1436   EOSABS 0.1 08/05/2022 1436   BASOSABS 0.1 08/05/2022 1436    BMET    Component Value Date/Time   NA 139 08/05/2022 1436   K 4.3 08/05/2022 1436   CL 104 08/05/2022 1436   CO2 29 08/05/2022 1436   GLUCOSE 115 (H) 08/05/2022 1436   BUN 13 08/05/2022 1436   CREATININE 0.96 08/05/2022 1436   CREATININE 0.96 06/16/2016 1734   CALCIUM 9.9 08/05/2022 1436   GFRNONAA >60 08/05/2022 1436   GFRAA >60 09/04/2014 0420    BNP    Component Value Date/Time   BNP 27.0 03/11/2022 1616    ProBNP No results found for: "PROBNP"  Imaging: No results found.   Assessment & Plan:   OSA on CPAP - Stable; Sleep apnea is well controlled on auto CPAP 5-20cm h20. He is 97% compliant with use > 4 hours over the last 30 days. No changes today. Renew CPAP supplies with [employer]. FU in 1 year or sooner if needed.   Pre-operative respiratory examination - Patient has a history of severe obstructive sleep apnea and pulmonary embolism. Respiratory exam today was normal. Patient is low risk for prolonged mechanical ventilation and/or postop pulmonary complications.  From pulmonary standpoint he is optimized for surgery. Ok to come off anticoagulation for 2-3 days prior to surgery; continue to wear CPAP nightly and advised to bring device with him day of surgery.  Major Pulmonary risks identified in the multifactorial risk analysis are but not limited to a) pneumonia; b) recurrent intubation risk; c) prolonged or recurrent acute respiratory failure needing mechanical ventilation; d) prolonged hospitalization; e) DVT/Pulmonary embolism; f) Acute Pulmonary edema  Recommend 1. Short  duration  of surgery as much as possible and avoid paralytic if possible 2. Recovery in step down or ICU with Pulmonary consultation if needed. Surgery can be done in outpatient setting  3. DVT prophylaxis 4. Aggressive pulmonary toilet with o2, bronchodilatation, and incentive spirometry and early ambulation    Pulmonary embolism (HCC) - Continue Eliquis 5mg  twice daily  - Repeat CTA chest in May showed no evidence of pulmonary embolism; Dopplers negative for acute or chronic LLE DVT - Following with Hematology, due for 3 month visit in August     1) RISK FOR PROLONGED MECHANICAL VENTILAION - > 48h  1A) Arozullah - Prolonged mech ventilation risk Arozullah Postperative Pulmonary Risk Score - for mech ventilation dependence >48h USAA, Ann Surg 2000, major non-cardiac surgery) Comment Score  Type of surgery - abd ao aneurysm (27), thoracic (21), neurosurgery / upper abdominal / vascular (21), neck (11) Knee surgery 5  Emergency Surgery - (11)  0  ALbumin < 3 or poor nutritional state - (9)  0  BUN > 30 -  (8)  0  Partial or completely dependent functional status - (7)  0  COPD -  (6)  0  Age - 60 to 69 (4), > 70  (6)  4  TOTAL  9  Risk Stratifcation scores  - < 10 (0.5%), 11-19 (1.8%), 20-27 (4.2%), 28-40 (10.1%), >40 (26.6%)  0.5%      1B) GUPTA - Prolonged Mech Vent Risk Score source Risk  Guptal post op prolonged mech ventilation > 48h or reintubation < 30 days - ACS 2007-2008 dataset - [email or web address] 0.3 % Risk of mechanical ventilation for >48 hrs after surgery, or unplanned intubation ?30 days of surgery    2) RISK FOR POST OP PNEUMONIA Score source Risk  Chales Abrahams - Post Op Pnemounia risk  [email or web address] 0.3 % Risk of postoperative pneumonia    R3) ISK FOR ANY POST-OP PULMONARY COMPLICATION Score source Risk  CANET/ARISCAT Score - risk for ANY/ALl pulmonary  complications - > risk of in-hospital post-op pulmonary complications (composite including respiratory failure, respiratory infection, pleural effusion, atelectasis, pneumothorax, bronchospasm, aspiration pneumonitis) [employer].es - based on age, anemia, pulse ox, resp infection prior 30d, incision site, duration of surgery, and emergency v elective surgery Low risk 1.6% risk of in-hospital post-op pulmonary complications (composite including respiratory failure, respiratory infection, pleural effusion, atelectasis, pneumothorax, bronchospasm, aspiration pneumonitis)      Glenford Bayley, NP 10/03/2022

## 2022-10-03 NOTE — Assessment & Plan Note (Addendum)
-   Continue Eliquis 5mg  twice daily  - Repeat CTA chest in May showed no evidence of pulmonary embolism; Dopplers negative for acute or chronic LLE DVT - Following with Hematology, due for 3 month visit in August

## 2022-10-03 NOTE — Progress Notes (Signed)
Reviewed and agree with assessment/plan.   Coralyn Helling, MD [facility] Pulmonary/Critical Care 10/03/2022, 10:50 AM Pager:  [phone]

## 2022-10-03 NOTE — Assessment & Plan Note (Signed)
-   Patient has a history of severe obstructive sleep apnea and pulmonary embolism. Respiratory exam today was normal. Patient is low risk for prolonged mechanical ventilation and/or postop pulmonary complications.  From pulmonary standpoint he is optimized for surgery. Ok to come off anticoagulation for 2-3 days prior to surgery; continue to wear CPAP nightly and advised to bring device with him day of surgery.  Major Pulmonary risks identified in the multifactorial risk analysis are but not limited to a) pneumonia; b) recurrent intubation risk; c) prolonged or recurrent acute respiratory failure needing mechanical ventilation; d) prolonged hospitalization; e) DVT/Pulmonary embolism; f) Acute Pulmonary edema  Recommend 1. Short duration of surgery as much as possible and avoid paralytic if possible 2. Recovery in step down or ICU with Pulmonary consultation if needed. Surgery can be done in outpatient setting  3. DVT prophylaxis 4. Aggressive pulmonary toilet with o2, bronchodilatation, and incentive spirometry and early ambulation

## 2022-10-03 NOTE — Assessment & Plan Note (Addendum)
-   Stable; Sleep apnea is well controlled on auto CPAP 5-20cm h20. He is 97% compliant with use > 4 hours over the last 30 days. No changes today. Renew CPAP supplies with [employer]. FU in 1 year or sooner if needed.

## 2022-10-03 NOTE — Patient Instructions (Signed)
From pulmonary standpoint okay to proceed with knee surgery Recommend early ambulation and use incentive spirometer postop  Wear CPAP and bring with you day of surgery 10 to wear CPAP nightly 4 to 6 hours or longer Work on weight loss as able Follow up with Hematology in August, discuss length of anticoagulation treatment needed  Orders: CPAP supplies with DME company  Follow-up 1 year with [facility] NP or sooner if needed

## 2022-10-04 ENCOUNTER — Encounter: Payer: Self-pay | Admitting: Family Medicine

## 2022-10-04 ENCOUNTER — Other Ambulatory Visit: Payer: Self-pay | Admitting: Family Medicine

## 2022-10-04 DIAGNOSIS — M170 Bilateral primary osteoarthritis of knee: Secondary | ICD-10-CM | POA: Diagnosis not present

## 2022-10-06 ENCOUNTER — Other Ambulatory Visit: Payer: Self-pay | Admitting: Family Medicine

## 2022-10-06 ENCOUNTER — Telehealth: Payer: Self-pay | Admitting: Family Medicine

## 2022-10-06 MED ORDER — TIRZEPATIDE 5 MG/0.5ML ~~LOC~~ SOAJ: 5.0000 mg | SUBCUTANEOUS | 1 refills | Status: DC

## 2022-10-06 NOTE — Telephone Encounter (Signed)
Pt dropped off Pre-Op form to be completed by PCP. Pt requests they be called when form has been completed and faxed to the number listed on the form. Forms placed in PCP's tray in front office.

## 2022-10-06 NOTE — Telephone Encounter (Signed)
Forms placed in Dr.Blyth bin for her to sign.

## 2022-10-11 ENCOUNTER — Other Ambulatory Visit: Payer: Self-pay

## 2022-10-11 MED ORDER — APIXABAN 5 MG PO TABS: ORAL_TABLET | ORAL | 2 refills | Status: DC

## 2022-10-14 DIAGNOSIS — K6289 Other specified diseases of anus and rectum: Secondary | ICD-10-CM | POA: Diagnosis not present

## 2022-10-14 DIAGNOSIS — Z86010 Personal history of colonic polyps: Secondary | ICD-10-CM | POA: Diagnosis not present

## 2022-10-14 DIAGNOSIS — D123 Benign neoplasm of transverse colon: Secondary | ICD-10-CM | POA: Diagnosis not present

## 2022-10-14 DIAGNOSIS — K648 Other hemorrhoids: Secondary | ICD-10-CM | POA: Diagnosis not present

## 2022-10-25 ENCOUNTER — Ambulatory Visit: Payer: BC Managed Care – PPO | Admitting: Family Medicine

## 2022-11-04 ENCOUNTER — Inpatient Hospital Stay: Payer: BC Managed Care – PPO | Admitting: Family

## 2022-11-04 ENCOUNTER — Other Ambulatory Visit: Payer: Self-pay | Admitting: Family

## 2022-11-04 ENCOUNTER — Encounter: Payer: Self-pay | Admitting: Family

## 2022-11-04 ENCOUNTER — Other Ambulatory Visit: Payer: Self-pay

## 2022-11-04 ENCOUNTER — Inpatient Hospital Stay: Payer: BC Managed Care – PPO | Attending: Family

## 2022-11-04 VITALS — BP 132/78 | HR 76 | Temp 97.8°F | Resp 17 | Ht 72.0 in | Wt 308.1 lb

## 2022-11-04 DIAGNOSIS — Z86718 Personal history of other venous thrombosis and embolism: Secondary | ICD-10-CM | POA: Insufficient documentation

## 2022-11-04 DIAGNOSIS — Z7901 Long term (current) use of anticoagulants: Secondary | ICD-10-CM | POA: Diagnosis not present

## 2022-11-04 DIAGNOSIS — I2692 Saddle embolus of pulmonary artery without acute cor pulmonale: Secondary | ICD-10-CM

## 2022-11-04 DIAGNOSIS — I82462 Acute embolism and thrombosis of left calf muscular vein: Secondary | ICD-10-CM

## 2022-11-04 DIAGNOSIS — D6859 Other primary thrombophilia: Secondary | ICD-10-CM

## 2022-11-04 DIAGNOSIS — Z86711 Personal history of pulmonary embolism: Secondary | ICD-10-CM | POA: Diagnosis not present

## 2022-11-04 LAB — CMP (CANCER CENTER ONLY)
ALT: 14 U/L (ref 0–44)
AST: 14 U/L — ABNORMAL LOW (ref 15–41)
Albumin: 4.1 g/dL (ref 3.5–5.0)
Alkaline Phosphatase: 64 U/L (ref 38–126)
Anion gap: 7 (ref 5–15)
BUN: 18 mg/dL (ref 6–20)
CO2: 31 mmol/L (ref 22–32)
Calcium: 9.2 mg/dL (ref 8.9–10.3)
Chloride: 102 mmol/L (ref 98–111)
Creatinine: 1.13 mg/dL (ref 0.61–1.24)
GFR, Estimated: >60 mL/min (ref >60)
Glucose, Bld: 113 mg/dL — ABNORMAL HIGH (ref 70–99)
Potassium: 4 mmol/L (ref 3.5–5.1)
Sodium: 140 mmol/L (ref 135–145)
Total Bilirubin: 0.5 mg/dL (ref 0.3–1.2)
Total Protein: 7.1 g/dL (ref 6.5–8.1)

## 2022-11-04 LAB — CBC WITH DIFFERENTIAL (CANCER CENTER ONLY)
Abs Immature Granulocytes: 0.03 10*3/uL (ref 0.00–0.07)
Basophils Absolute: 0.1 10*3/uL (ref 0.0–0.1)
Basophils Relative: 2 %
Eosinophils Absolute: 0.2 10*3/uL (ref 0.0–0.5)
Eosinophils Relative: 3 %
HCT: 46.8 % (ref 39.0–52.0)
Hemoglobin: 14.8 g/dL (ref 13.0–17.0)
Immature Granulocytes: 0 %
Lymphocytes Relative: 21 %
Lymphs Abs: 1.6 10*3/uL (ref 0.7–4.0)
MCH: 28.6 pg (ref 26.0–34.0)
MCHC: 31.6 g/dL (ref 30.0–36.0)
MCV: 90.3 fL (ref 80.0–100.0)
Monocytes Absolute: 0.8 10*3/uL (ref 0.1–1.0)
Monocytes Relative: 11 %
Neutro Abs: 4.8 10*3/uL (ref 1.7–7.7)
Neutrophils Relative %: 63 %
Platelet Count: 278 10*3/uL (ref 150–400)
RBC: 5.18 MIL/uL (ref 4.22–5.81)
RDW: 14.6 % (ref 11.5–15.5)
WBC Count: 7.6 10*3/uL (ref 4.0–10.5)
nRBC: 0 % (ref 0.0–0.2)

## 2022-11-04 NOTE — Progress Notes (Signed)
Hematology and Oncology Follow Up Visit  Jeffrey Molina [id number] 02/01/1961 62 y.o. 11/04/2022   Principle Diagnosis:  Pulmonary embolism and LLE DVT dx Dec 2023 - resolved on 08/17/2022 repeat CTA and [place] Hyper coag panel negative   Current Therapy:   Eliquis 5 mg PO BID   Interim History:  Jeffrey Molina is here today for follow-up. He is doing quite well and has no complaints at this time.  His PE and DVT had resolved on repeat imaging back in May.  He is tolerating Eliquis nicely and has had minimal bruising on his arms. No petechiae.  No blood loss noted.  No fever, chills, n/v, cough, rash, dizziness, SOB, chest pain, palpitations, abdominal pain or changes in bowel or bladder habits at this time.  He has chronic swelling in both lower extremities due to PAD. Unchanged from baseline. Pedal pulses are 2+.  He is on Mounjaro and his weight is down to 308 lbs (previously 332 lbs). He states that he has 15 or so more lbs to lose before he can have his knee replacement surgeries.  Appetite and hydration are good.  ECOG Performance Status: 0 - Asymptomatic  Medications:  Allergies as of 11/04/2022       Reactions   Lisinopril-hydrochlorothiazide Swelling   Lip swollen        Medication List        Accurate as of November 04, 2022  1:54 PM. If you have any questions, ask your nurse or doctor.          apixaban 5 MG Tabs tablet Commonly known as: Eliquis Eliquis 1 tablet twice a day.   atorvastatin 10 MG tablet Commonly known as: LIPITOR Take 1 tablet (10 mg total) by mouth daily.   B-12 5000 MCG Tbdp Take 1 tablet by mouth daily.   CALCIUM 600 + D PO Take 1 tablet by mouth daily.   metFORMIN 500 MG tablet Commonly known as: GLUCOPHAGE Take 1 tablet (500 mg total) by mouth daily with breakfast.   metoprolol succinate 25 MG 24 hr tablet Commonly known as: TOPROL-XL Take 1 tablet (25 mg total) by mouth daily.   metoprolol tartrate 25 MG tablet Commonly known  as: LOPRESSOR TAKE ONE-HALF (1/2) TABLET TWICE A DAY What changed: See the new instructions.   multivitamin with minerals Tabs tablet Take 1 tablet by mouth 2 (two) times daily.   PROBIOTIC & ACIDOPHILUS EX ST PO Take 1 tablet by mouth daily.   sildenafil 50 MG tablet Commonly known as: Viagra Take 1 tablet (50 mg total) by mouth daily as needed for erectile dysfunction.   tirzepatide 5 MG/0.5ML Pen Commonly known as: MOUNJARO Inject 5 mg into the skin once a week.   Vitamin D (Ergocalciferol) 1.25 MG (50000 UNIT) Caps capsule Commonly known as: DRISDOL Take 1 capsule (50,000 Units total) by mouth every 7 (seven) days.   ZzzQuil 50 MG/30ML Liqd Generic drug: diphenhydrAMINE HCl (Sleep) Take by mouth at bedtime as needed.        Allergies:  Allergies  Allergen Reactions   Lisinopril-Hydrochlorothiazide Swelling    Lip swollen    Past Medical History, Surgical history, Social history, and Family History were reviewed and updated.  Review of Systems: All other 10 point review of systems is negative.   Physical Exam:  vitals were not taken for this visit.   Wt Readings from Last 3 Encounters:  10/03/22 (!) 316 lb 9.6 oz (143.6 kg)  08/05/22 (!) 329 lb (149.2 kg)  07/25/22 (!) 332 lb (150.6 kg)    Ocular: Sclerae unicteric, pupils equal, round and reactive to light Ear-nose-throat: Oropharynx clear, dentition fair Lymphatic: No cervical or supraclavicular adenopathy Lungs no rales or rhonchi, good excursion bilaterally Heart regular rate and rhythm, no murmur appreciated Abd soft, nontender, positive bowel sounds MSK no focal spinal tenderness, no joint edema Neuro: non-focal, well-oriented, appropriate affect Breasts: Deferred   Lab Results  Component Value Date   WBC 8.0 08/05/2022   HGB 14.7 08/05/2022   HCT 46.0 08/05/2022   MCV 89.3 08/05/2022   PLT 298 08/05/2022   Lab Results  Component Value Date   FERRITIN 180 04/02/2014   IRON 72 04/02/2014    TIBC 290 04/02/2014   UIBC 218 04/02/2014   IRONPCTSAT 25 04/02/2014   Lab Results  Component Value Date   RBC 5.15 08/05/2022   No results found for: "KPAFRELGTCHN", "LAMBDASER", "KAPLAMBRATIO" No results found for: "IGGSERUM", "IGA", "IGMSERUM" No results found for: "TOTALPROTELP", "ALBUMINELP", "A1GS", "A2GS", "BETS", "BETA2SER", "GAMS", "MSPIKE", "SPEI"   Chemistry      Component Value Date/Time   NA 139 08/05/2022 1436   K 4.3 08/05/2022 1436   CL 104 08/05/2022 1436   CO2 29 08/05/2022 1436   BUN 13 08/05/2022 1436   CREATININE 0.96 08/05/2022 1436   CREATININE 0.96 06/16/2016 1734      Component Value Date/Time   CALCIUM 9.9 08/05/2022 1436   ALKPHOS 68 08/05/2022 1436   AST 18 08/05/2022 1436   ALT 23 08/05/2022 1436   BILITOT 0.5 08/05/2022 1436       Impression and Plan: Jeffrey Molina is a very pleasant 62 yo caucasian gentleman with diagnosis of PE and LLE DVT in December 2023. He is doing well on Eliquis and will continue his same regimen.  Hyper coag panel negative.  Follow-up in 4 months. At that time we will reduce him to half dose Eliquis 2.5 mg PO BID   Eileen Stanford, NP 8/16/20241:54 PM

## 2022-11-29 DIAGNOSIS — R0981 Nasal congestion: Secondary | ICD-10-CM | POA: Diagnosis not present

## 2022-11-29 DIAGNOSIS — J019 Acute sinusitis, unspecified: Secondary | ICD-10-CM | POA: Diagnosis not present

## 2022-11-29 DIAGNOSIS — R051 Acute cough: Secondary | ICD-10-CM | POA: Diagnosis not present

## 2022-12-12 ENCOUNTER — Encounter: Payer: Self-pay | Admitting: Family Medicine

## 2022-12-14 NOTE — Assessment & Plan Note (Signed)
Here today in preparation of TKR. He feels well and is anxious to proceed to help him get moving again. He will discuss risks and benefits of procedure with surgeon

## 2022-12-14 NOTE — Assessment & Plan Note (Signed)
Well controlled, no changes to meds. Encouraged heart healthy diet such as the DASH diet and exercise as tolerated.  °

## 2022-12-14 NOTE — Assessment & Plan Note (Signed)
Encouraged heart healthy diet, increase exercise, avoid trans fats, consider a krill oil cap daily

## 2022-12-15 ENCOUNTER — Telehealth: Payer: BC Managed Care – PPO | Admitting: Family Medicine

## 2022-12-15 DIAGNOSIS — I10 Essential (primary) hypertension: Secondary | ICD-10-CM | POA: Diagnosis not present

## 2022-12-15 DIAGNOSIS — E669 Obesity, unspecified: Secondary | ICD-10-CM

## 2022-12-15 DIAGNOSIS — M25562 Pain in left knee: Secondary | ICD-10-CM | POA: Diagnosis not present

## 2022-12-15 DIAGNOSIS — E559 Vitamin D deficiency, unspecified: Secondary | ICD-10-CM

## 2022-12-15 DIAGNOSIS — E785 Hyperlipidemia, unspecified: Secondary | ICD-10-CM | POA: Diagnosis not present

## 2022-12-15 DIAGNOSIS — R7989 Other specified abnormal findings of blood chemistry: Secondary | ICD-10-CM

## 2022-12-15 DIAGNOSIS — M25561 Pain in right knee: Secondary | ICD-10-CM | POA: Diagnosis not present

## 2022-12-15 DIAGNOSIS — E1169 Type 2 diabetes mellitus with other specified complication: Secondary | ICD-10-CM

## 2022-12-15 MED ORDER — HYDROXYZINE PAMOATE 25 MG PO CAPS: 25.0000 mg | ORAL_CAPSULE | Freq: Two times a day (BID) | ORAL | 2 refills | Status: DC | PRN

## 2022-12-15 MED ORDER — TIRZEPATIDE 7.5 MG/0.5ML ~~LOC~~ SOAJ: 7.5000 mg | SUBCUTANEOUS | 2 refills | Status: DC

## 2022-12-15 NOTE — Telephone Encounter (Signed)
PT seen w/i three mo. Needs SX clearance. Please call to advise. (Sometimes I can not bring up my Surgery Clearance Routing. Sorry to send direct.)

## 2022-12-16 ENCOUNTER — Encounter: Payer: Self-pay | Admitting: Family Medicine

## 2022-12-16 NOTE — Telephone Encounter (Signed)
Called pt lab appt scheduled for Monday.

## 2022-12-18 ENCOUNTER — Encounter: Payer: Self-pay | Admitting: Family Medicine

## 2022-12-18 NOTE — Progress Notes (Signed)
MyChart Video Visit    Virtual Visit via Video Note   This patient is at least at moderate risk for complications without adequate follow up. This format is felt to be most appropriate for this patient at this time. Physical exam was limited by quality of the video and audio technology used for the visit. Shamaine, CMA was able to get the patient set up on a video visit.  Patient location: home Patient and provider in visit Provider location: Office  I discussed the limitations of evaluation and management by telemedicine and the availability of in person appointments. The patient expressed understanding and agreed to proceed.  Visit Date: 12/15/2022  Today's healthcare provider: Danise Edge, MD     Subjective:    Patient ID: Jeffrey Molina, male    DOB: 30-Mar-1960, 62 y.o.   MRN: [id number]  Chief Complaint  Patient presents with   Pre-op Exam    Pre-op Exam     HPI Discussed the use of AI scribe software for clinical note transcription with the patient, who gave verbal consent to proceed.  History of Present Illness   The patient, with a history of hypertension and obesity, presents with difficulty sleeping. They report waking up frequently during the night due to racing thoughts. Over-the-counter sleep aid Z-Core has not been effective. The patient's wife has had success with Hydroxyzine (Atarax) 25mg  for her own sleep issues, and the patient is interested in trying this medication.  The patient also reports a recent COVID-19 infection, which was managed with Paxlovid and resolved within two days. They have been working on weight loss in preparation for a knee surgery, with a current weight of 298.9 lbs and a goal weight of 294 lbs. The patient's knee issues have significantly impacted their mobility and they express a lack of confidence in the stability of their knee.  The patient is also on Mounjaro for weight loss, currently at a dose of 0.5mg . They have a supply of  this medication left and are open to increasing the dose to 0.75mg  if it does not significantly increase the cost.        Past Medical History:  Diagnosis Date   Anemia 10/28/2013   pt. stated "no" at preop appt. 08-29-14   Arthritis    both knees   Chicken pox as a child   Cutaneous skin tags 10/28/2013   Diabetes mellitus type 2 in obese 01/27/2014   GERD (gastroesophageal reflux disease)    History of chicken pox    Hypertension    Low testosterone 10/28/2013   Peripheral artery disease (HCC) 11/03/2013   Preventative health care 10/26/2016   Sleep apnea    has c-pap machine   Tachycardia 05/15/2014    Past Surgical History:  Procedure Laterality Date   colonscopy     x 2   GASTRIC ROUX-EN-Y N/A 09/02/2014   Procedure: LAPAROSCOPIC ROUX-EN-Y GASTRIC BYPASS WITH UPPER ENDOSCOPY;  Surgeon: Gaynelle Adu, MD;  Location: [facility];  Service: General;  Laterality: N/A;    Family History  Problem Relation Age of Onset   Fibromyalgia Mother    Cancer Father        skin cancer   Cataracts Father    Arthritis Father    Diabetes Maternal Grandfather     Social History   Socioeconomic History   Marital status: Married    Spouse name: Not on file   Number of children: Not on file   Years of education: Not on file  Highest education level: Not on file  Occupational History   Occupation: Event organiser: [employer]  Tobacco Use   Smoking status: Never   Smokeless tobacco: Never  Vaping Use   Vaping status: Never Used  Substance and Sexual Activity   Alcohol use: No   Drug use: No   Sexual activity: Yes    Comment: lives with wife and children, works for [employer]  Other Topics Concern   Not on file  Social History Narrative   Not on file   Social Determinants of Health   Financial Resource Strain: Not on file  Food Insecurity: No Food Insecurity (08/05/2022)   Hunger Vital Sign    Worried About Running Out of Food in the Last Year: Never  true    Ran Out of Food in the Last Year: Never true  Transportation Needs: No Transportation Needs (08/05/2022)   PRAPARE - Administrator, Civil Service (Medical): No    Lack of Transportation (Non-Medical): No  Physical Activity: Not on file  Stress: Not on file  Social Connections: Not on file  Intimate Partner Violence: Not At Risk (08/05/2022)   Humiliation, Afraid, Rape, and Kick questionnaire    Fear of Current or Ex-Partner: No    Emotionally Abused: No    Physically Abused: No    Sexually Abused: No    Outpatient Medications Prior to Visit  Medication Sig Dispense Refill   apixaban (ELIQUIS) 5 MG TABS tablet Eliquis 1 tablet twice a day. 30 tablet 2   atorvastatin (LIPITOR) 10 MG tablet Take 1 tablet (10 mg total) by mouth daily. 90 tablet 3   Calcium Carb-Cholecalciferol (CALCIUM 600 + D PO) Take 1 tablet by mouth daily.     diphenhydrAMINE HCl, Sleep, (ZZZQUIL) 50 MG/30ML LIQD Take by mouth at bedtime as needed.     metFORMIN (GLUCOPHAGE) 500 MG tablet Take 1 tablet (500 mg total) by mouth daily with breakfast. 30 tablet 3   Methylcobalamin (B-12) 5000 MCG TBDP Take 1 tablet by mouth daily.     metoprolol succinate (TOPROL-XL) 25 MG 24 hr tablet Take 1 tablet (25 mg total) by mouth daily. 90 tablet 1   metoprolol tartrate (LOPRESSOR) 25 MG tablet TAKE ONE-HALF (1/2) TABLET TWICE A DAY (Patient taking differently: Take 12.5 mg by mouth daily.) 90 tablet 3   Multiple Vitamin (MULTIVITAMIN WITH MINERALS) TABS tablet Take 1 tablet by mouth 2 (two) times daily.     Probiotic Product (PROBIOTIC & ACIDOPHILUS EX ST PO) Take 1 tablet by mouth daily.     sildenafil (VIAGRA) 50 MG tablet Take 1 tablet (50 mg total) by mouth daily as needed for erectile dysfunction. 30 tablet 0   Vitamin D, Ergocalciferol, (DRISDOL) 1.25 MG (50000 UNIT) CAPS capsule Take 1 capsule (50,000 Units total) by mouth every 7 (seven) days. 5 capsule 3   tirzepatide (MOUNJARO) 5 MG/0.5ML Pen Inject 5  mg into the skin once a week. 6 mL 1   No facility-administered medications prior to visit.    Allergies  Allergen Reactions   Lisinopril-Hydrochlorothiazide Swelling    Lip swollen    Review of Systems  Constitutional:  Negative for fever and malaise/fatigue.  HENT:  Negative for congestion.   Eyes:  Negative for blurred vision.  Respiratory:  Negative for shortness of breath.   Cardiovascular:  Negative for chest pain, palpitations and leg swelling.  Gastrointestinal:  Negative for abdominal pain, blood in stool and nausea.  Genitourinary:  Negative for dysuria and frequency.  Musculoskeletal:  Positive for joint pain. Negative for back pain and falls.  Skin:  Negative for rash.  Neurological:  Negative for dizziness, loss of consciousness and headaches.  Endo/Heme/Allergies:  Negative for environmental allergies.  Psychiatric/Behavioral:  Negative for depression. The patient is not nervous/anxious.        Objective:    Physical Exam Constitutional:      General: He is not in acute distress.    Appearance: Normal appearance. He is not ill-appearing or toxic-appearing.  HENT:     Head: Normocephalic and atraumatic.     Right Ear: External ear normal.     Left Ear: External ear normal.     Nose: Nose normal.  Eyes:     General:        Right eye: No discharge.        Left eye: No discharge.  Pulmonary:     Effort: Pulmonary effort is normal.  Skin:    Findings: No rash.  Neurological:     Mental Status: He is alert and oriented to person, place, and time.  Psychiatric:        Behavior: Behavior normal.     There were no vitals taken for this visit. Wt Readings from Last 3 Encounters:  11/04/22 (!) 308 lb 1.3 oz (139.7 kg)  10/03/22 (!) 316 lb 9.6 oz (143.6 kg)  08/05/22 (!) 329 lb (149.2 kg)       Assessment & Plan:  Primary hypertension Assessment & Plan: Well controlled, no changes to meds. Encouraged heart healthy diet such as the DASH diet and  exercise as tolerated.   Orders: -     Comprehensive metabolic panel; Future -     CBC with Differential/Platelet; Future -     TSH; Future  Pain in both knees, unspecified chronicity Assessment & Plan: Here today in preparation of TKR. He feels well and is anxious to proceed to help him get moving again. He will discuss risks and benefits of procedure with surgeon   Hyperlipidemia, unspecified hyperlipidemia type Assessment & Plan: Encouraged heart healthy diet, increase exercise, avoid trans fats, consider a krill oil cap daily  Orders: -     Lipid panel; Future  Vitamin D deficiency -     VITAMIN D 25 Hydroxy (Vit-D Deficiency, Fractures); Future  Type 2 diabetes mellitus with obesity (HCC) -     Hemoglobin A1c; Future  Low testosterone -     Testosterone; Future  Other orders -     hydrOXYzine Pamoate; Take 1 capsule (25 mg total) by mouth 2 (two) times daily as needed.  Dispense: 40 capsule; Refill: 2 -     Tirzepatide; Inject 7.5 mg into the skin once a week.  Dispense: 6 mL; Refill: 2     Assessment and Plan    Insomnia Difficulty sleeping, Z-Quil (diphenhydramine) not effective. Discussed the use of Hydroxyzine (Atarax) 25mg , an antihistamine, as an alternative. Also suggested the use of Magnesium Glycinate for sleep. -Prescribe Hydroxyzine 25mg  for sleep, to be picked up at [facility] in [place]. -Advise against taking Hydroxyzine with other antihistamines at the same time.  Weight Management Patient is close to weight goal for knee surgery. Currently taking Mounjaro 5 mg. -Increase Mounjaro to 7.5mg  daily. -Check cost of increased dose with pharmacy.  Preoperative Clearance Patient is close to weight goal for knee surgery. Recent recovery from COVID-19. -Order lab work including A1C, Vitamin D, and Testosterone levels. -Schedule lab appointment  for Monday, 12/19/2022. -Complete clearance form once lab results are received.  Hypertension Well controlled with  home readings averaging 127/83. -Continue current management.  Follow-up appointment scheduled for 01/19/2023.         I discussed the assessment and treatment plan with the patient. The patient was provided an opportunity to ask questions and all were answered. The patient agreed with the plan and demonstrated an understanding of the instructions.   The patient was advised to call back or seek an in-person evaluation if the symptoms worsen or if the condition fails to improve as anticipated.  Danise Edge, MD [facility] at [facility] [phone] (phone) [phone] (fax)  [facility]

## 2022-12-19 ENCOUNTER — Other Ambulatory Visit (INDEPENDENT_AMBULATORY_CARE_PROVIDER_SITE_OTHER): Payer: BC Managed Care – PPO

## 2022-12-19 DIAGNOSIS — I10 Essential (primary) hypertension: Secondary | ICD-10-CM | POA: Diagnosis not present

## 2022-12-19 DIAGNOSIS — E669 Obesity, unspecified: Secondary | ICD-10-CM

## 2022-12-19 DIAGNOSIS — R7989 Other specified abnormal findings of blood chemistry: Secondary | ICD-10-CM | POA: Diagnosis not present

## 2022-12-19 DIAGNOSIS — E785 Hyperlipidemia, unspecified: Secondary | ICD-10-CM

## 2022-12-19 DIAGNOSIS — E559 Vitamin D deficiency, unspecified: Secondary | ICD-10-CM

## 2022-12-19 DIAGNOSIS — E1169 Type 2 diabetes mellitus with other specified complication: Secondary | ICD-10-CM

## 2022-12-19 LAB — VITAMIN D 25 HYDROXY (VIT D DEFICIENCY, FRACTURES): VITD: 53.77 ng/mL (ref 30.00–100.00)

## 2022-12-19 LAB — CBC WITH DIFFERENTIAL/PLATELET
Basophils Absolute: 0.1 10*3/uL (ref 0.0–0.1)
Basophils Relative: 1.7 % (ref 0.0–3.0)
Eosinophils Absolute: 0.2 10*3/uL (ref 0.0–0.7)
Eosinophils Relative: 3.3 % (ref 0.0–5.0)
HCT: 43.4 % (ref 39.0–52.0)
Hemoglobin: 14 g/dL (ref 13.0–17.0)
Lymphocytes Relative: 22.3 % (ref 12.0–46.0)
Lymphs Abs: 1.5 10*3/uL (ref 0.7–4.0)
MCHC: 32.3 g/dL (ref 30.0–36.0)
MCV: 89.2 fL (ref 78.0–100.0)
Monocytes Absolute: 0.7 10*3/uL (ref 0.1–1.0)
Monocytes Relative: 10.2 % (ref 3.0–12.0)
Neutro Abs: 4.2 10*3/uL (ref 1.4–7.7)
Neutrophils Relative %: 62.5 % (ref 43.0–77.0)
Platelets: 280 10*3/uL (ref 150.0–400.0)
RBC: 4.86 Mil/uL (ref 4.22–5.81)
RDW: 14.9 % (ref 11.5–15.5)
WBC: 6.7 10*3/uL (ref 4.0–10.5)

## 2022-12-19 LAB — COMPREHENSIVE METABOLIC PANEL
ALT: 21 U/L (ref 0–53)
AST: 14 U/L (ref 0–37)
Albumin: 3.7 g/dL (ref 3.5–5.2)
Alkaline Phosphatase: 57 U/L (ref 39–117)
BUN: 12 mg/dL (ref 6–23)
CO2: 29 meq/L (ref 19–32)
Calcium: 8.9 mg/dL (ref 8.4–10.5)
Chloride: 104 meq/L (ref 96–112)
Creatinine, Ser: 1 mg/dL (ref 0.40–1.50)
GFR: 81.75 mL/min (ref >60.00)
Glucose, Bld: 88 mg/dL (ref 70–99)
Potassium: 3.6 meq/L (ref 3.5–5.1)
Sodium: 140 meq/L (ref 135–145)
Total Bilirubin: 0.7 mg/dL (ref 0.2–1.2)
Total Protein: 6.3 g/dL (ref 6.0–8.3)

## 2022-12-19 LAB — LIPID PANEL
Cholesterol: 113 mg/dL (ref 0–200)
HDL: 37.3 mg/dL — ABNORMAL LOW (ref >39.00)
LDL Cholesterol: 58 mg/dL (ref 0–99)
NonHDL: 75.42
Total CHOL/HDL Ratio: 3
Triglycerides: 89 mg/dL (ref 0.0–149.0)
VLDL: 17.8 mg/dL (ref 0.0–40.0)

## 2022-12-19 LAB — HEMOGLOBIN A1C: Hgb A1c MFr Bld: 5.8 % (ref 4.6–6.5)

## 2022-12-19 LAB — TSH: TSH: 3.76 u[IU]/mL (ref 0.35–5.50)

## 2022-12-19 LAB — TESTOSTERONE: Testosterone: 244.2 ng/dL — ABNORMAL LOW (ref 300.00–890.00)

## 2022-12-20 NOTE — Telephone Encounter (Signed)
Typically we send send office note to surgeon, can triage do this please. We last saw him in July, needs to be within 90 days

## 2022-12-21 ENCOUNTER — Encounter: Payer: Self-pay | Admitting: Family Medicine

## 2022-12-27 ENCOUNTER — Telehealth: Payer: Self-pay | Admitting: Family Medicine

## 2022-12-27 NOTE — Telephone Encounter (Signed)
Greetings  This lovely gentleman is awaiting a TKR with Dr Sherlean Foot but is on Eliquis after a h/o PE and DVT LLE 02/2022 and last note on Eliquis was to continue on 5 mg bid then reduce to 2.5 mg bid in December but I need some guidance regarding holding the Eliquis for the surgery. Should we bridge with Lovenox. Do you want me to get him up to see you? Or something in between?  Appreciate your guidance in this matter and afraid I might have missed your input.  Regards, S

## 2022-12-28 NOTE — Telephone Encounter (Signed)
Called pt was advised that Dr.Blyth is aware of his message and needing the forms fax.Dr.Blyth has sent message to Hematology  regarding his medication. When they respond back on what to do, we will then send forms and will  give him a call.Pt stated he understand.

## 2022-12-29 ENCOUNTER — Telehealth: Payer: Self-pay | Admitting: *Deleted

## 2022-12-29 NOTE — Telephone Encounter (Signed)
Per staff message Ronne Binning - Called patient and lvm of new appointment and requested callback to confirm.

## 2022-12-29 NOTE — Telephone Encounter (Signed)
This has not been addressed. Dr. Abner Greenspan was requesting input/guidance from you guys if possible.

## 2022-12-30 NOTE — Telephone Encounter (Signed)
Pt sent a message and scheduled to see Rushie Chestnut, PA-C Hematology to discuss treatment plan so they can sign for his surgery.

## 2023-01-02 ENCOUNTER — Other Ambulatory Visit: Payer: Self-pay

## 2023-01-02 ENCOUNTER — Inpatient Hospital Stay: Payer: BC Managed Care – PPO | Admitting: Medical Oncology

## 2023-01-02 ENCOUNTER — Inpatient Hospital Stay: Payer: BC Managed Care – PPO | Attending: Family

## 2023-01-02 ENCOUNTER — Encounter: Payer: Self-pay | Admitting: Medical Oncology

## 2023-01-02 ENCOUNTER — Other Ambulatory Visit: Payer: Self-pay | Admitting: *Deleted

## 2023-01-02 VITALS — BP 117/81 | HR 65 | Temp 98.4°F | Resp 18 | Ht 72.0 in | Wt 297.0 lb

## 2023-01-02 DIAGNOSIS — I82462 Acute embolism and thrombosis of left calf muscular vein: Secondary | ICD-10-CM

## 2023-01-02 DIAGNOSIS — Z86718 Personal history of other venous thrombosis and embolism: Secondary | ICD-10-CM | POA: Insufficient documentation

## 2023-01-02 DIAGNOSIS — Z7901 Long term (current) use of anticoagulants: Secondary | ICD-10-CM | POA: Insufficient documentation

## 2023-01-02 DIAGNOSIS — Z01818 Encounter for other preprocedural examination: Secondary | ICD-10-CM

## 2023-01-02 DIAGNOSIS — Z86711 Personal history of pulmonary embolism: Secondary | ICD-10-CM | POA: Diagnosis not present

## 2023-01-02 DIAGNOSIS — I2692 Saddle embolus of pulmonary artery without acute cor pulmonale: Secondary | ICD-10-CM

## 2023-01-02 DIAGNOSIS — D6859 Other primary thrombophilia: Secondary | ICD-10-CM

## 2023-01-02 LAB — CMP (CANCER CENTER ONLY)
ALT: 17 U/L (ref 0–44)
AST: 15 U/L (ref 15–41)
Albumin: 3.7 g/dL (ref 3.5–5.0)
Alkaline Phosphatase: 67 U/L (ref 38–126)
Anion gap: 6 (ref 5–15)
BUN: 13 mg/dL (ref 6–20)
CO2: 32 mmol/L (ref 22–32)
Calcium: 9.2 mg/dL (ref 8.9–10.3)
Chloride: 104 mmol/L (ref 98–111)
Creatinine: 1.1 mg/dL (ref 0.61–1.24)
GFR, Estimated: >60 mL/min (ref >60)
Glucose, Bld: 94 mg/dL (ref 70–99)
Potassium: 4.6 mmol/L (ref 3.5–5.1)
Sodium: 142 mmol/L (ref 135–145)
Total Bilirubin: 0.6 mg/dL (ref 0.3–1.2)
Total Protein: 6.8 g/dL (ref 6.5–8.1)

## 2023-01-02 LAB — CBC WITH DIFFERENTIAL (CANCER CENTER ONLY)
Abs Immature Granulocytes: 0.01 10*3/uL (ref 0.00–0.07)
Basophils Absolute: 0.1 10*3/uL (ref 0.0–0.1)
Basophils Relative: 1 %
Eosinophils Absolute: 0.3 10*3/uL (ref 0.0–0.5)
Eosinophils Relative: 4 %
HCT: 44.4 % (ref 39.0–52.0)
Hemoglobin: 14.4 g/dL (ref 13.0–17.0)
Immature Granulocytes: 0 %
Lymphocytes Relative: 24 %
Lymphs Abs: 1.6 10*3/uL (ref 0.7–4.0)
MCH: 29.1 pg (ref 26.0–34.0)
MCHC: 32.4 g/dL (ref 30.0–36.0)
MCV: 89.9 fL (ref 80.0–100.0)
Monocytes Absolute: 0.8 10*3/uL (ref 0.1–1.0)
Monocytes Relative: 13 %
Neutro Abs: 3.9 10*3/uL (ref 1.7–7.7)
Neutrophils Relative %: 58 %
Platelet Count: 251 10*3/uL (ref 150–400)
RBC: 4.94 MIL/uL (ref 4.22–5.81)
RDW: 14.5 % (ref 11.5–15.5)
WBC Count: 6.6 10*3/uL (ref 4.0–10.5)
nRBC: 0 % (ref 0.0–0.2)

## 2023-01-02 NOTE — Progress Notes (Signed)
Hematology and Oncology Follow Up Visit  Brittany Amirault [id number] May 08, 1960 62 y.o. 01/02/2023   Principle Diagnosis:  Pulmonary embolism and LLE DVT dx Dec 2023 - resolved on 08/17/2022 repeat CTA and [place] Hyper coag panel negative   Current Therapy:   Eliquis 5 mg PO BID   Interim History:  Mr. Rago is here today for follow-up. He is doing quite well and has no complaints at this time.   He is here for advisement of pre-operative anticoagulation management. He is having a total left knee replacement performed on TBD by Dr. Linton Ham with [facility].   He is currently on Eliquis given his history of PE and DVT in Dec 2023. He has had negative hypercoagulable work up thus far. He remains asymptomatic.   No blood loss noted.  No fever, chills, n/v, cough, rash, dizziness, SOB, chest pain, palpitations, abdominal pain or changes in bowel or bladder habits at this time.  He has chronic swelling in both lower extremities due to PAD. Unchanged from baseline. Pedal pulses are 2+.  He is on Mounjaro to help lose weight prior to his knee replacement surgery Appetite and hydration are good.  ECOG Performance Status: 0 - Asymptomatic  Medications:  Allergies as of 01/02/2023       Reactions   Lisinopril-hydrochlorothiazide Swelling   Lip swollen        Medication List        Accurate as of January 02, 2023 11:24 AM. If you have any questions, ask your nurse or doctor.          apixaban 5 MG Tabs tablet Commonly known as: Eliquis Eliquis 1 tablet twice a day.   atorvastatin 10 MG tablet Commonly known as: LIPITOR Take 1 tablet (10 mg total) by mouth daily.   B-12 5000 MCG Tbdp Take 1 tablet by mouth daily.   CALCIUM 600 + D PO Take 1 tablet by mouth daily.   hydrOXYzine 25 MG capsule Commonly known as: VISTARIL Take 1 capsule (25 mg total) by mouth 2 (two) times daily as needed.   metFORMIN 500 MG tablet Commonly known as: GLUCOPHAGE Take 1 tablet (500 mg  total) by mouth daily with breakfast.   metoprolol succinate 25 MG 24 hr tablet Commonly known as: TOPROL-XL Take 1 tablet (25 mg total) by mouth daily.   metoprolol tartrate 25 MG tablet Commonly known as: LOPRESSOR TAKE ONE-HALF (1/2) TABLET TWICE A DAY What changed: See the new instructions.   multivitamin with minerals Tabs tablet Take 1 tablet by mouth 2 (two) times daily.   PROBIOTIC & ACIDOPHILUS EX ST PO Take 1 tablet by mouth daily.   sildenafil 50 MG tablet Commonly known as: Viagra Take 1 tablet (50 mg total) by mouth daily as needed for erectile dysfunction.   tirzepatide 7.5 MG/0.5ML Pen Commonly known as: MOUNJARO Inject 7.5 mg into the skin once a week.   Vitamin D (Ergocalciferol) 1.25 MG (50000 UNIT) Caps capsule Commonly known as: DRISDOL Take 1 capsule (50,000 Units total) by mouth every 7 (seven) days.   ZzzQuil 50 MG/30ML Liqd Generic drug: diphenhydrAMINE HCl (Sleep) Take by mouth at bedtime as needed.        Allergies:  Allergies  Allergen Reactions   Lisinopril-Hydrochlorothiazide Swelling    Lip swollen    Past Medical History, Surgical history, Social history, and Family History were reviewed and updated.  Review of Systems: All other 10 point review of systems is negative.   Physical Exam:  height is  6' (1.829 m) and weight is 297 lb 0.6 oz (134.7 kg). His oral temperature is 98.4 F (36.9 C). His blood pressure is 117/81 and his pulse is 65. His respiration is 18 and oxygen saturation is 100%.   Wt Readings from Last 3 Encounters:  01/02/23 297 lb 0.6 oz (134.7 kg)  11/04/22 (!) 308 lb 1.3 oz (139.7 kg)  10/03/22 (!) 316 lb 9.6 oz (143.6 kg)    Ocular: Sclerae unicteric, pupils equal, round and reactive to light Ear-nose-throat: Oropharynx clear, dentition fair Lymphatic: No cervical or supraclavicular adenopathy Lungs no rales or rhonchi, good excursion bilaterally Heart regular rate and rhythm, no murmur appreciated Abd  soft, nontender, positive bowel sounds MSK no focal spinal tenderness, no joint edema Neuro: non-focal, well-oriented, appropriate affect  Lab Results  Component Value Date   WBC 6.6 01/02/2023   HGB 14.4 01/02/2023   HCT 44.4 01/02/2023   MCV 89.9 01/02/2023   PLT 251 01/02/2023   Lab Results  Component Value Date   FERRITIN 180 04/02/2014   IRON 72 04/02/2014   TIBC 290 04/02/2014   UIBC 218 04/02/2014   IRONPCTSAT 25 04/02/2014   Lab Results  Component Value Date   RBC 4.94 01/02/2023   No results found for: "KPAFRELGTCHN", "LAMBDASER", "KAPLAMBRATIO" No results found for: "IGGSERUM", "IGA", "IGMSERUM" No results found for: "TOTALPROTELP", "ALBUMINELP", "A1GS", "A2GS", "BETS", "BETA2SER", "GAMS", "MSPIKE", "SPEI"   Chemistry      Component Value Date/Time   NA 142 01/02/2023 1026   K 4.6 01/02/2023 1026   CL 104 01/02/2023 1026   CO2 32 01/02/2023 1026   BUN 13 01/02/2023 1026   CREATININE 1.10 01/02/2023 1026   CREATININE 0.96 06/16/2016 1734      Component Value Date/Time   CALCIUM 9.2 01/02/2023 1026   ALKPHOS 67 01/02/2023 1026   AST 15 01/02/2023 1026   ALT 17 01/02/2023 1026   BILITOT 0.6 01/02/2023 1026     Encounter Diagnoses  Name Primary?   Encounter for perioperative consultation Yes   Long term current use of anticoagulant therapy    Personal history of venous thrombosis and embolism      Impression and Plan: Mr. Hartnett is a very pleasant 62 yo caucasian gentleman with diagnosis of PE and LLE DVT in December 2023. He is doing well on Eliquis.   His procedure is considered a high bleeding risk procedure. He will stop his Eliquis 2 days prior to his surgery and Eliquis should be restarted 2 days following the procedure as long as hemostasis has been achieved. We discussed that should he have any SOB, chest pain, calf pain or abnormal swelling to let hospital staff know so he can be further evaluated.    Rushie Chestnut, PA-C 10/14/202411:24  AM

## 2023-01-02 NOTE — Telephone Encounter (Signed)
Called pt was advised and he had appt with hematology. Pt understand and we discuss how to the Eliquis for 2 days prior to surgery and then start him back on Eliquis at 2.5 mg po bid x 3 days then increase back to 5 mg bid as per hematology. Pt advise form fax for surgical clearance today.

## 2023-01-03 ENCOUNTER — Other Ambulatory Visit: Payer: Self-pay | Admitting: Family Medicine

## 2023-01-19 ENCOUNTER — Ambulatory Visit: Payer: BC Managed Care – PPO | Admitting: Family Medicine

## 2023-02-28 NOTE — Telephone Encounter (Signed)
Dr.'s office did not receive a "signed" copy of the nice letter I think Ms. Walsh composed. Please sin and fax again to [phone]  States they need ASAP because surgery is Monday.

## 2023-03-03 ENCOUNTER — Other Ambulatory Visit: Payer: Self-pay

## 2023-03-03 DIAGNOSIS — D6859 Other primary thrombophilia: Secondary | ICD-10-CM

## 2023-03-03 DIAGNOSIS — M25562 Pain in left knee: Secondary | ICD-10-CM | POA: Diagnosis not present

## 2023-03-03 DIAGNOSIS — M170 Bilateral primary osteoarthritis of knee: Secondary | ICD-10-CM | POA: Diagnosis not present

## 2023-03-03 DIAGNOSIS — G8929 Other chronic pain: Secondary | ICD-10-CM | POA: Diagnosis not present

## 2023-03-03 NOTE — Telephone Encounter (Signed)
Please see message below. Clearance rec'd buy not signed. Need to refax signed clearance. Surgery is Monday.   Fax [phone] (She is in til 12:00)

## 2023-03-06 ENCOUNTER — Inpatient Hospital Stay: Payer: BC Managed Care – PPO

## 2023-03-06 ENCOUNTER — Inpatient Hospital Stay: Payer: BC Managed Care – PPO | Admitting: Medical Oncology

## 2023-03-06 DIAGNOSIS — M1712 Unilateral primary osteoarthritis, left knee: Secondary | ICD-10-CM | POA: Diagnosis not present

## 2023-03-06 DIAGNOSIS — M25762 Osteophyte, left knee: Secondary | ICD-10-CM | POA: Diagnosis not present

## 2023-03-07 NOTE — Telephone Encounter (Signed)
Copy of note containing risk assessment was signed by Waynetta Sandy and faxed to Dr Valentina Gu 03/03/23

## 2023-03-09 DIAGNOSIS — G4733 Obstructive sleep apnea (adult) (pediatric): Secondary | ICD-10-CM | POA: Diagnosis not present

## 2023-03-14 ENCOUNTER — Encounter: Payer: Self-pay | Admitting: Family Medicine

## 2023-03-14 NOTE — Telephone Encounter (Signed)
 Care team updated and letter sent for eye exam notes.

## 2023-03-16 DIAGNOSIS — Z96652 Presence of left artificial knee joint: Secondary | ICD-10-CM | POA: Diagnosis not present

## 2023-03-18 IMAGING — US US EXTREM LOW VENOUS*R*
1 series · 13 of 24 positions shown · non-contrast
Comparison: None

CLINICAL DATA: RIGHT lower extremity swelling, calf swelling in a
60-year-old male, associated with pain and edema.

EXAM:
RIGHT LOWER EXTREMITY VENOUS DOPPLER ULTRASOUND
TECHNIQUE: Gray-scale sonography with compression, as well as color and duplex
ultrasound, were performed to evaluate the deep venous system(s)
from the level of the common femoral vein through the popliteal and
proximal calf veins.

[Series 1: us extrem low venous*right* · 13 of 44 slices shown]
[im 1/44]
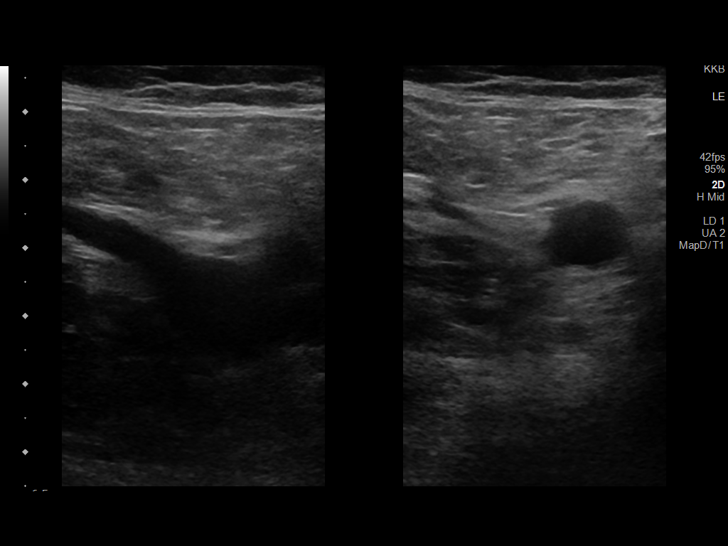
[im 4/44]
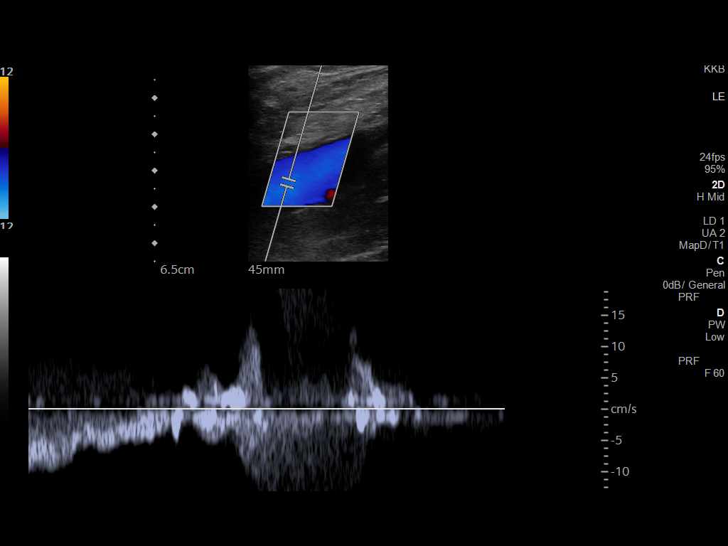
[im 8/44]
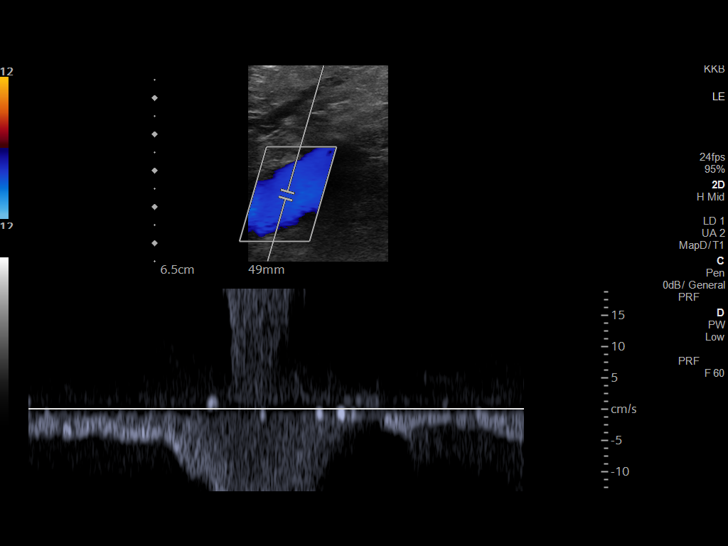
[im 12/44]
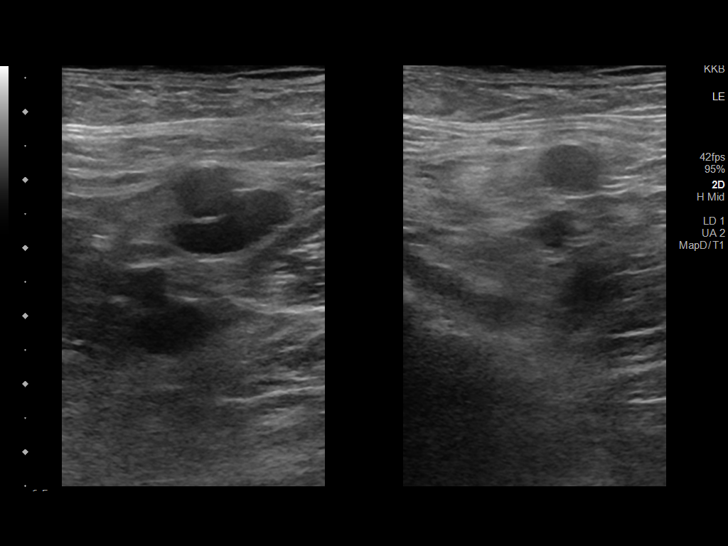
[im 15/44]
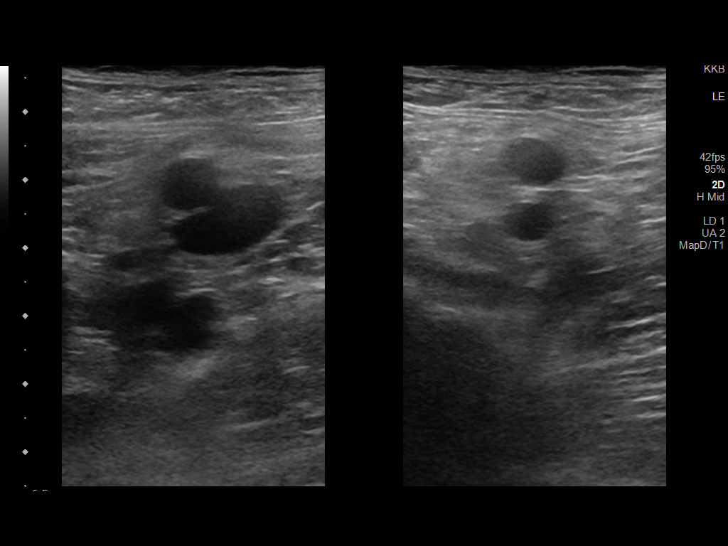
[im 19/44]
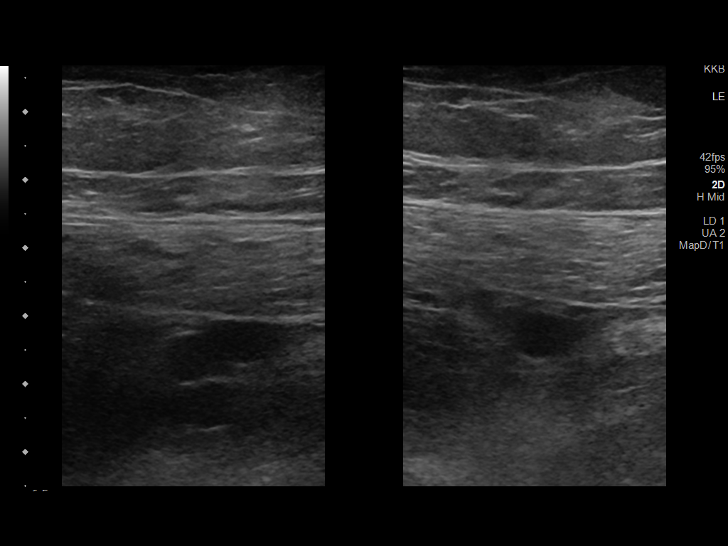
[im 23/44]
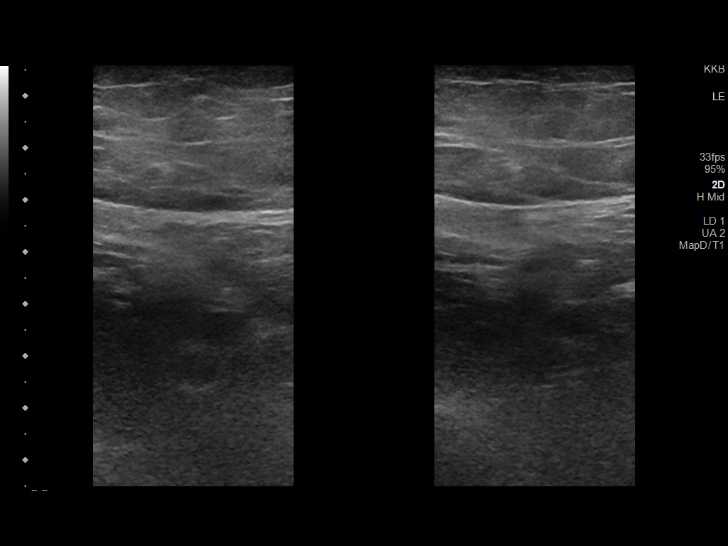
[im 25/44]
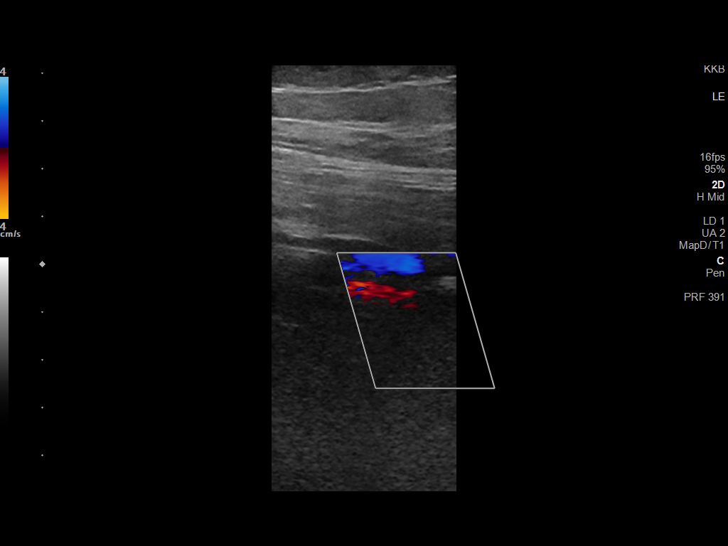
[im 29/44]
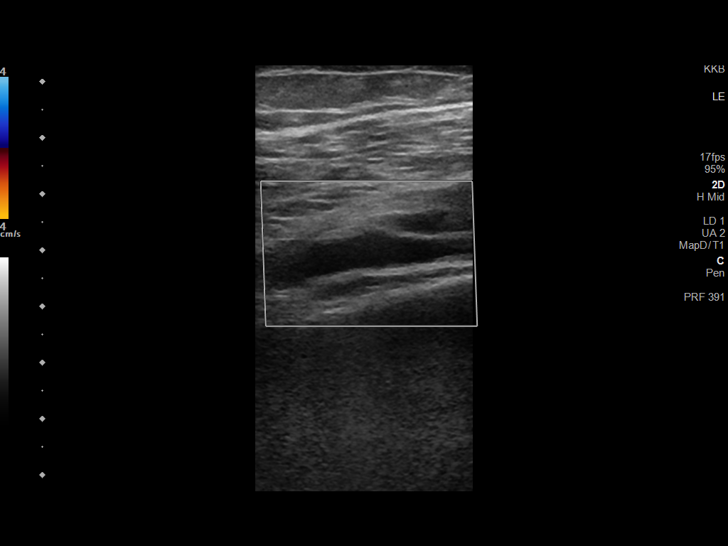
[im 32/44]
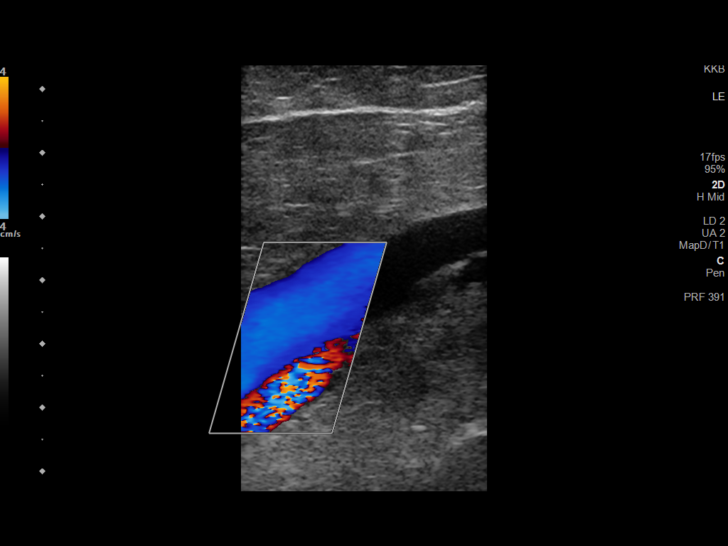
[im 36/44]
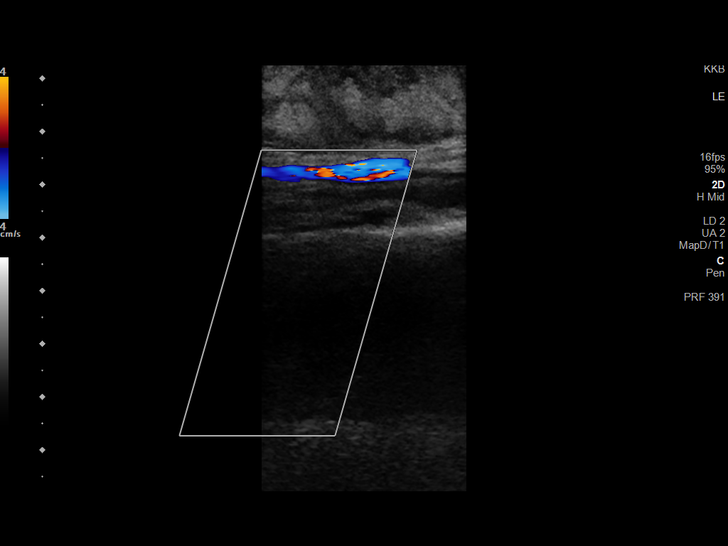
[im 40/44]
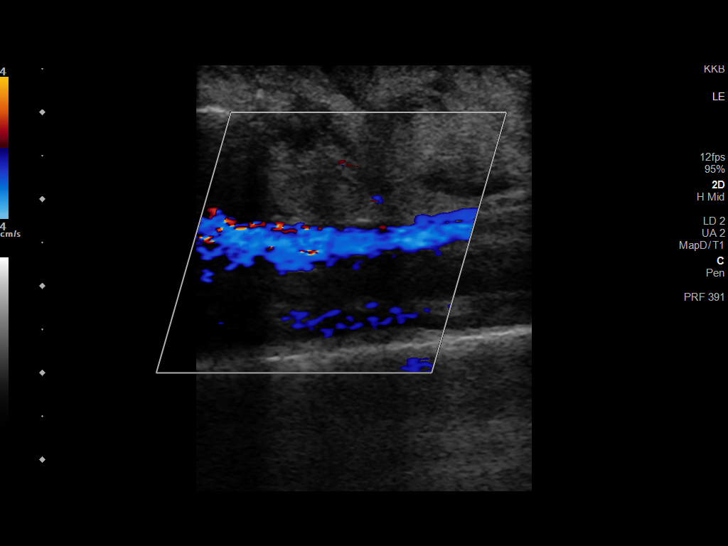
[im 44/44]
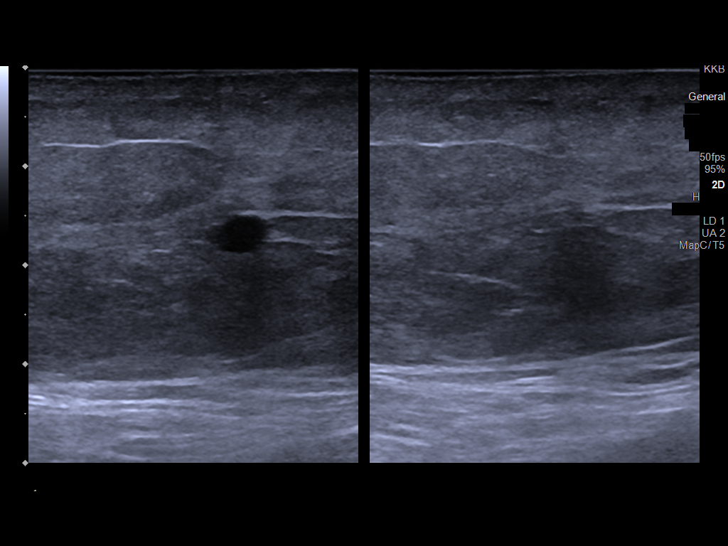

[13 of 24 positions shown; findings below may reference images not displayed]

FINDINGS: VENOUS

Normal compressibility of the common femoral, superficial femoral,
and popliteal veins, as well as the visualized calf veins.
Visualized portions of profunda femoral vein and great saphenous
vein unremarkable. No filling defects to suggest DVT on grayscale or
color Doppler imaging. Doppler waveforms show normal direction of
venous flow, normal respiratory plasticity and response to
augmentation.

Limited views of the contralateral common femoral vein are
unremarkable.

OTHER

4.2 x 1.0 x 3.7 cm fluid collection superior to the patella, may be
within the joint, difficult to determine on limited imaging.

Edema throughout the lower leg within visualized portions.

Limitations: none
IMPRESSION: No evidence of deep venous thrombosis in the RIGHT lower extremity.

Lower extremity edema and what appears represent a suprapatellar
joint effusion. Correlate with physical examination and with any
signs of infection.

These results will be called to the ordering clinician or
representative by the Radiologist Assistant, and communication
documented in the PACS or [REDACTED].

## 2023-03-26 ENCOUNTER — Other Ambulatory Visit: Payer: Self-pay | Admitting: Family Medicine

## 2023-04-06 ENCOUNTER — Other Ambulatory Visit: Payer: Self-pay | Admitting: Family Medicine

## 2023-04-07 LAB — HM DIABETES EYE EXAM

## 2023-04-09 DIAGNOSIS — G4733 Obstructive sleep apnea (adult) (pediatric): Secondary | ICD-10-CM | POA: Diagnosis not present

## 2023-04-13 DIAGNOSIS — Z96652 Presence of left artificial knee joint: Secondary | ICD-10-CM | POA: Diagnosis not present

## 2023-04-23 NOTE — Assessment & Plan Note (Signed)
RRR today 

## 2023-04-23 NOTE — Assessment & Plan Note (Signed)
 Well controlled, no changes to meds. Encouraged heart healthy diet such as the DASH diet and exercise as tolerated.

## 2023-04-23 NOTE — Assessment & Plan Note (Signed)
Encouraged heart healthy diet, increase exercise, avoid trans fats, consider a krill oil cap daily 

## 2023-04-23 NOTE — Assessment & Plan Note (Signed)
 hgba1c acceptable, minimize simple carbs. Increase exercise as tolerated. Continue current meds

## 2023-04-23 NOTE — Assessment & Plan Note (Signed)
 hgba1c acceptable, minimize simple carbs. Increase exercise as tolerated.

## 2023-04-24 ENCOUNTER — Ambulatory Visit: Payer: BC Managed Care – PPO | Admitting: Family Medicine

## 2023-04-24 ENCOUNTER — Encounter: Payer: Self-pay | Admitting: Family

## 2023-04-24 ENCOUNTER — Encounter: Payer: Self-pay | Admitting: Family Medicine

## 2023-04-24 VITALS — BP 130/78 | HR 86 | Temp 97.6°F | Resp 18 | Ht 72.0 in | Wt 287.8 lb

## 2023-04-24 DIAGNOSIS — E1169 Type 2 diabetes mellitus with other specified complication: Secondary | ICD-10-CM | POA: Diagnosis not present

## 2023-04-24 DIAGNOSIS — I10 Essential (primary) hypertension: Secondary | ICD-10-CM

## 2023-04-24 DIAGNOSIS — E785 Hyperlipidemia, unspecified: Secondary | ICD-10-CM

## 2023-04-24 DIAGNOSIS — R000 Tachycardia, unspecified: Secondary | ICD-10-CM | POA: Diagnosis not present

## 2023-04-24 DIAGNOSIS — Z7985 Long-term (current) use of injectable non-insulin antidiabetic drugs: Secondary | ICD-10-CM

## 2023-04-24 DIAGNOSIS — E559 Vitamin D deficiency, unspecified: Secondary | ICD-10-CM

## 2023-04-24 DIAGNOSIS — R739 Hyperglycemia, unspecified: Secondary | ICD-10-CM

## 2023-04-24 DIAGNOSIS — E669 Obesity, unspecified: Secondary | ICD-10-CM

## 2023-04-24 LAB — COMPREHENSIVE METABOLIC PANEL
ALT: 14 U/L (ref 0–53)
AST: 15 U/L (ref 0–37)
Albumin: 3.6 g/dL (ref 3.5–5.2)
Alkaline Phosphatase: 61 U/L (ref 39–117)
BUN: 13 mg/dL (ref 6–23)
CO2: 28 meq/L (ref 19–32)
Calcium: 8.4 mg/dL (ref 8.4–10.5)
Chloride: 104 meq/L (ref 96–112)
Creatinine, Ser: 0.91 mg/dL (ref 0.40–1.50)
GFR: 91.33 mL/min (ref >60.00)
Glucose, Bld: 85 mg/dL (ref 70–99)
Potassium: 3.8 meq/L (ref 3.5–5.1)
Sodium: 140 meq/L (ref 135–145)
Total Bilirubin: 0.5 mg/dL (ref 0.2–1.2)
Total Protein: 6.2 g/dL (ref 6.0–8.3)

## 2023-04-24 LAB — CBC WITH DIFFERENTIAL/PLATELET
Basophils Absolute: 0.1 10*3/uL (ref 0.0–0.1)
Basophils Relative: 2.2 % (ref 0.0–3.0)
Eosinophils Absolute: 0.3 10*3/uL (ref 0.0–0.7)
Eosinophils Relative: 5.3 % — ABNORMAL HIGH (ref 0.0–5.0)
HCT: 42.6 % (ref 39.0–52.0)
Hemoglobin: 13.6 g/dL (ref 13.0–17.0)
Lymphocytes Relative: 24 % (ref 12.0–46.0)
Lymphs Abs: 1.4 10*3/uL (ref 0.7–4.0)
MCHC: 32 g/dL (ref 30.0–36.0)
MCV: 91.7 fL (ref 78.0–100.0)
Monocytes Absolute: 0.7 10*3/uL (ref 0.1–1.0)
Monocytes Relative: 11.4 % (ref 3.0–12.0)
Neutro Abs: 3.3 10*3/uL (ref 1.4–7.7)
Neutrophils Relative %: 57.1 % (ref 43.0–77.0)
Platelets: 328 10*3/uL (ref 150.0–400.0)
RBC: 4.64 Mil/uL (ref 4.22–5.81)
RDW: 15.5 % (ref 11.5–15.5)
WBC: 5.8 10*3/uL (ref 4.0–10.5)

## 2023-04-24 LAB — VITAMIN D 25 HYDROXY (VIT D DEFICIENCY, FRACTURES): VITD: 32.23 ng/mL (ref 30.00–100.00)

## 2023-04-24 LAB — TSH: TSH: 3.72 u[IU]/mL (ref 0.35–5.50)

## 2023-04-24 LAB — LIPID PANEL
Cholesterol: 112 mg/dL (ref 0–200)
HDL: 39 mg/dL — ABNORMAL LOW (ref >39.00)
LDL Cholesterol: 57 mg/dL (ref 0–99)
NonHDL: 73.01
Total CHOL/HDL Ratio: 3
Triglycerides: 80 mg/dL (ref 0.0–149.0)
VLDL: 16 mg/dL (ref 0.0–40.0)

## 2023-04-24 LAB — HEMOGLOBIN A1C: Hgb A1c MFr Bld: 5.4 % (ref 4.6–6.5)

## 2023-04-24 MED ORDER — SILDENAFIL CITRATE 100 MG PO TABS: 50.0000 mg | ORAL_TABLET | Freq: Every day | ORAL | 5 refills | Status: AC | PRN

## 2023-04-24 NOTE — Progress Notes (Signed)
Subjective:    Patient ID: Jeffrey Molina, male    DOB: 15-Dec-1960, 63 y.o.   MRN: [id number]  Chief Complaint  Patient presents with  . Follow-up    HPI Discussed the use of AI scribe software for clinical note transcription with the patient, who gave verbal consent to proceed.  History of Present Illness   The patient, who had knee replacement surgery seven weeks ago, presents with sleep disturbances since the surgery. The patient reports sleeping for only about an hour and a half the previous night and has been experiencing similar sleep disturbances since the surgery. The patient also reports morning stiffness in the knee but does not attribute the sleep disturbances to pain. The patient is currently undergoing therapy and reports that others in the therapy group have had similar experiences. The patient is also on sildenafil and Eliquis due to a previous blood clot. The patient is considering increasing the dose of sildenafil. The patient is due for a flu shot and a COVID booster.        Past Medical History:  Diagnosis Date  . Anemia 10/28/2013   pt. stated "no" at preop appt. 08-29-14  . Arthritis    both knees  . Chicken pox as a child  . Cutaneous skin tags 10/28/2013  . Diabetes mellitus type 2 in obese 01/27/2014  . GERD (gastroesophageal reflux disease)   . History of chicken pox   . Hypertension   . Low testosterone 10/28/2013  . Peripheral artery disease (HCC) 11/03/2013  . Preventative health care 10/26/2016  . Sleep apnea    has c-pap machine  . Tachycardia 05/15/2014    Past Surgical History:  Procedure Laterality Date  . colonscopy     x 2  . 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 Fibromyalgia Mother   . Cancer Father        skin cancer  . Cataracts Father    . Arthritis Father   . Diabetes Maternal Grandfather     Social History   Socioeconomic History  . Marital status: Married    Spouse name: Not on file  . Number of children: Not on file  . Years of education: Not on file  . Highest education level: Bachelor's degree (e.g., BA, AB, BS)  Occupational History  . Occupation: Event organiser: [employer]  Tobacco Use  . Smoking status: Never  . Smokeless tobacco: Never  Vaping Use  . Vaping status: Never Used  Substance and Sexual Activity  . Alcohol use: No  . Drug use: No  . Sexual activity: Yes    Comment: lives with wife and children, works for [employer]  Other Topics Concern  . Not on file  Social History Narrative  . Not on file   Social Drivers of Health   Financial Resource Strain: Low Risk  (04/21/2023)   Overall Financial Resource Strain (CARDIA)   . Difficulty of Paying Living Expenses: Not hard at all  Food Insecurity: No Food Insecurity (04/21/2023)   Hunger Vital Sign   . Worried About Programme researcher, broadcasting/film/video in the Last Year: Never true   . Ran Out of Food in the Last Year: Never true  Transportation Needs: No Transportation Needs (04/21/2023)   PRAPARE -  Transportation   . Lack of Transportation (Medical): No   . Lack of Transportation (Non-Medical): No  Physical Activity: Unknown (04/21/2023)   Exercise Vital Sign   . Days of Exercise per Week: 0 days   . Minutes of Exercise per Session: Not on file  Stress: Stress Concern Present (04/21/2023)   [employer] of Occupational Health - Occupational Stress Questionnaire   . Feeling of Stress : To some extent  Social Connections: Socially Integrated (04/21/2023)   Social Connection and Isolation Panel [NHANES]   . Frequency of Communication with Friends and Family: More than three times a week   . Frequency of Social Gatherings with Friends and Family: Once a week   . Attends Religious Services: More than 4 times per year   .  Active Member of Clubs or Organizations: Yes   . Attends Banker Meetings: More than 4 times per year   . Marital Status: Married  Catering manager Violence: Not At Risk (08/05/2022)   Humiliation, Afraid, Rape, and Kick questionnaire   . Fear of Current or Ex-Partner: No   . Emotionally Abused: No   . Physically Abused: No   . Sexually Abused: No    Outpatient Medications Prior to Visit  Medication Sig Dispense Refill  . apixaban (ELIQUIS) 5 MG TABS tablet Eliquis 1 tablet twice a day. 30 tablet 2  . atorvastatin (LIPITOR) 10 MG tablet Take 1 tablet (10 mg total) by mouth daily. 90 tablet 0  . Calcium Carb-Cholecalciferol (CALCIUM 600 + D PO) Take 1 tablet by mouth daily.    . diphenhydrAMINE HCl, Sleep, (ZZZQUIL) 50 MG/30ML LIQD Take by mouth at bedtime as needed.    . hydrOXYzine (VISTARIL) 25 MG capsule TAKE 1 CAPSULE (25 MG TOTAL) BY MOUTH TWICE A DAY AS NEEDED 40 capsule 2  . metFORMIN (GLUCOPHAGE) 500 MG tablet Take 1 tablet (500 mg total) by mouth daily with breakfast. 30 tablet 3  . Methylcobalamin (B-12) 5000 MCG TBDP Take 1 tablet by mouth daily.    . metoprolol succinate (TOPROL-XL) 25 MG 24 hr tablet Take 1 tablet (25 mg total) by mouth daily. Take with or immediately following a meal 90 tablet 1  . Multiple Vitamin (MULTIVITAMIN WITH MINERALS) TABS tablet Take 1 tablet by mouth 2 (two) times daily.    . Probiotic Product (PROBIOTIC & ACIDOPHILUS EX ST PO) Take 1 tablet by mouth daily.    . tirzepatide (MOUNJARO) 7.5 MG/0.5ML Pen Inject 7.5 mg into the skin once a week. 6 mL 2  . Vitamin D, Ergocalciferol, (DRISDOL) 1.25 MG (50000 UNIT) CAPS capsule Take 1 capsule (50,000 Units total) by mouth every 7 (seven) days. 5 capsule 3  . sildenafil (VIAGRA) 50 MG tablet Take 1 tablet (50 mg total) by mouth daily as needed for erectile dysfunction. 30 tablet 0  . metoprolol tartrate (LOPRESSOR) 25 MG tablet TAKE ONE-HALF (1/2) TABLET TWICE A DAY (Patient taking differently:  Take 12.5 mg by mouth daily.) 90 tablet 3   No facility-administered medications prior to visit.    Allergies  Allergen Reactions  . Lisinopril-Hydrochlorothiazide Swelling    Lip swollen    Review of Systems  Constitutional:  Negative for fever and malaise/fatigue.  HENT:  Negative for congestion.   Eyes:  Negative for blurred vision.  Respiratory:  Negative for shortness of breath.   Cardiovascular:  Negative for chest pain, palpitations and leg swelling.  Gastrointestinal:  Negative for abdominal pain, blood in stool and nausea.  Genitourinary:  Negative for dysuria and frequency.  Musculoskeletal:  Positive for joint pain. Negative for falls.  Skin:  Negative for rash.  Neurological:  Negative for dizziness, loss of consciousness and headaches.  Endo/Heme/Allergies:  Negative for environmental allergies.  Psychiatric/Behavioral:  Negative for depression. The patient has insomnia. The patient is not nervous/anxious.        Objective:    Physical Exam Vitals reviewed.  Constitutional:      Appearance: Normal appearance. He is not ill-appearing.  HENT:     Head: Normocephalic and atraumatic.     Nose: Nose normal.  Eyes:     Conjunctiva/sclera: Conjunctivae normal.  Cardiovascular:     Rate and Rhythm: Normal rate.     Pulses: Normal pulses.     Heart sounds: Normal heart sounds. No murmur heard. Pulmonary:     Effort: Pulmonary effort is normal.     Breath sounds: Normal breath sounds. No wheezing.  Abdominal:     Palpations: Abdomen is soft. There is no mass.     Tenderness: There is no abdominal tenderness.  Musculoskeletal:     Cervical back: Normal range of motion.     Right lower leg: No edema.     Left lower leg: No edema.  Skin:    General: Skin is warm and dry.  Neurological:     General: No focal deficit present.     Mental Status: He is alert and oriented to person, place, and time.  Psychiatric:        Mood and Affect: Mood normal.   BP 130/78  (BP Location: Right Arm, Patient Position: Sitting, Cuff Size: Large)   Pulse 86   Temp 97.6 F (36.4 C) (Oral)   Resp 18   Ht 6' (1.829 m)   Wt 287 lb 12.8 oz (130.5 kg)   SpO2 98%   BMI 39.03 kg/m  Wt Readings from Last 3 Encounters:  04/24/23 287 lb 12.8 oz (130.5 kg)  01/02/23 297 lb 0.6 oz (134.7 kg)  11/04/22 (!) 308 lb 1.3 oz (139.7 kg)    Diabetic Foot Exam - Simple   No data filed    Lab Results  Component Value Date   WBC 5.8 04/24/2023   HGB 13.6 04/24/2023   HCT 42.6 04/24/2023   PLT 328.0 04/24/2023   GLUCOSE 85 04/24/2023   CHOL 112 04/24/2023   TRIG 80.0 04/24/2023   HDL 39.00 (L) 04/24/2023   LDLCALC 57 04/24/2023   ALT 14 04/24/2023   AST 15 04/24/2023   NA 140 04/24/2023   K 3.8 04/24/2023   CL 104 04/24/2023   CREATININE 0.91 04/24/2023   BUN 13 04/24/2023   CO2 28 04/24/2023   TSH 3.72 04/24/2023   PSA 0.75 07/21/2020   HGBA1C 5.4 04/24/2023   MICROALBUR <0.7 07/26/2022    Lab Results  Component Value Date   TSH 3.72 04/24/2023   Lab Results  Component Value Date   WBC 5.8 04/24/2023   HGB 13.6 04/24/2023   HCT 42.6 04/24/2023   MCV 91.7 04/24/2023   PLT 328.0 04/24/2023   Lab Results  Component Value Date   NA 140 04/24/2023   K 3.8 04/24/2023   CO2 28 04/24/2023   GLUCOSE 85 04/24/2023   BUN 13 04/24/2023   CREATININE 0.91 04/24/2023   BILITOT 0.5 04/24/2023   ALKPHOS 61 04/24/2023   AST 15 04/24/2023   ALT 14 04/24/2023   PROT 6.2 04/24/2023   ALBUMIN 3.6 04/24/2023   CALCIUM 8.4  04/24/2023   ANIONGAP 6 01/02/2023   GFR 91.33 04/24/2023   Lab Results  Component Value Date   CHOL 112 04/24/2023   Lab Results  Component Value Date   HDL 39.00 (L) 04/24/2023   Lab Results  Component Value Date   LDLCALC 57 04/24/2023   Lab Results  Component Value Date   TRIG 80.0 04/24/2023   Lab Results  Component Value Date   CHOLHDL 3 04/24/2023   Lab Results  Component Value Date   HGBA1C 5.4 04/24/2023        Assessment & Plan:  Primary hypertension Assessment & Plan: Well controlled, no changes to meds. Encouraged heart healthy diet such as the DASH diet and exercise as tolerated.   Orders: -     Comprehensive metabolic panel -     CBC with Differential/Platelet -     TSH  Hyperglycemia Assessment & Plan: hgba1c acceptable, minimize simple carbs. Increase exercise as tolerated.  Orders: -     Hemoglobin A1c  Hyperlipidemia, unspecified hyperlipidemia type Assessment & Plan: Encouraged heart healthy diet, increase exercise, avoid trans fats, consider a krill oil cap daily  Orders: -     Lipid panel  Type 2 diabetes mellitus with obesity (HCC) Assessment & Plan: hgba1c acceptable, minimize simple carbs. Increase exercise as tolerated. Continue current meds   Tachycardia Assessment & Plan: RRR today   Vitamin D deficiency disease  Vitamin D deficiency -     VITAMIN D 25 Hydroxy (Vit-D Deficiency, Fractures)  Other orders -     Sildenafil Citrate; Take 0.5-1 tablets (50-100 mg total) by mouth daily as needed for erectile dysfunction.  Dispense: 20 tablet; Refill: 5    Assessment and Plan    Post Knee Replacement Insomnia New onset insomnia since knee replacement surgery 7 weeks ago. Not associated with pain. -Try natural remedies such as magnesium glycinate and eltriptyphan. -Consider short-term use of sleep medication like Ambien if insomnia persists.  Post Knee Replacement Stiffness Morning stiffness in the knee post-surgery. Expected to improve over time. -Consider use of CBD cream for joint pain relief.  Erectile Dysfunction Previous use of sildenafil with good effect. Prescription has run out. -Renew sildenafil prescription at 100mg  dose.  Anticoagulation for Previous Blood Clot On Eliquis since blood clot event over a year ago. No family history of clotting disorders. -Continue Eliquis. Discuss duration of therapy with pulmonary specialist. Consider  hematology consult for further evaluation if needed.  Weight Management On Mounjaro 7.5mg  for appetite suppression. -Continue Mounjaro 7.5mg . Consider increasing dose if appetite suppression effect diminishes.  Vitamin D Deficiency Previous low levels of Vitamin D. Prescription has run out. -Check Vitamin D levels. Start over-the-counter Vitamin D3 at 2000 IU daily.  General Health Maintenance -Administer flu shot today. -Consider COVID booster, RSV (Arexvy), and Prevnar 20 vaccinations. -Order full panel of blood work. -Schedule follow-up in 6 months.         Danise Edge, MD

## 2023-04-24 NOTE — Patient Instructions (Addendum)
Magnesium Glycinate 200-400 mg daily at bedtime  L Tryptophan capsules [place] red light  CBD cream at website [employer] Vitamin D3 OTC 2000 international units  daily  RSV, Respiratory Syncitial Virus Vaccine, Arexvy at pharmacy Prevnar 20 once at pharmacy or here  Encouraged good sleep hygiene such as dark, quiet room. No blue/green glowing lights such as computer screens in bedroom. No alcohol or stimulants in evening. Cut down on caffeine as able. Regular exercise is helpful but not just prior to bed time.     Insomnia Insomnia is a sleep disorder that makes it difficult to fall asleep or stay asleep. Insomnia can cause fatigue, low energy, difficulty concentrating, mood swings, and poor performance at work or school. There are three different ways to classify insomnia: Difficulty falling asleep. Difficulty staying asleep. Waking up too early in the morning. Any type of insomnia can be long-term (chronic) or short-term (acute). Both are common. Short-term insomnia usually lasts for 3 months or less. Chronic insomnia occurs at least three times a week for longer than 3 months. What are the causes? Insomnia may be caused by another condition, situation, or substance, such as: Having certain mental health conditions, such as anxiety and depression. Using caffeine, alcohol, tobacco, or drugs. Having gastrointestinal conditions, such as gastroesophageal reflux disease (GERD). Having certain medical conditions. These include: Asthma. Alzheimer's disease. Stroke. Chronic pain. An overactive thyroid gland (hyperthyroidism). Other sleep disorders, such as restless legs syndrome and sleep apnea. Menopause. Sometimes, the cause of insomnia may not be known. What increases the risk? Risk factors for insomnia include: Gender. Females are affected more often than males. Age. Insomnia is more common as people get older. Stress and certain medical and mental health conditions. Lack of  exercise. Having an irregular work schedule. This may include working night shifts and traveling between different time zones. What are the signs or symptoms? If you have insomnia, the main symptom is having trouble falling asleep or having trouble staying asleep. This may lead to other symptoms, such as: Feeling tired or having low energy. Feeling nervous about going to sleep. Not feeling rested in the morning. Having trouble concentrating. Feeling irritable, anxious, or depressed. How is this diagnosed? This condition may be diagnosed based on: Your symptoms and medical history. Your health care provider may ask about: Your sleep habits. Any medical conditions you have. Your mental health. A physical exam. How is this treated? Treatment for insomnia depends on the cause. Treatment may focus on treating an underlying condition that is causing the insomnia. Treatment may also include: Medicines to help you sleep. Counseling or therapy. Lifestyle adjustments to help you sleep better. Follow these instructions at home: Eating and drinking  Limit or avoid alcohol, caffeinated beverages, and products that contain nicotine and tobacco, especially close to bedtime. These can disrupt your sleep. Do not eat a large meal or eat spicy foods right before bedtime. This can lead to digestive discomfort that can make it hard for you to sleep. Sleep habits  Keep a sleep diary to help you and your health care provider figure out what could be causing your insomnia. Write down: When you sleep. When you wake up during the night. How well you sleep and how rested you feel the next day. Any side effects of medicines you are taking. What you eat and drink. Make your bedroom a dark, comfortable place where it is easy to fall asleep. Put up shades or blackout curtains to block light from outside. Use a  white noise machine to block noise. Keep the temperature cool. Limit screen use before bedtime. This  includes: Not watching TV. Not using your smartphone, tablet, or computer. Stick to a routine that includes going to bed and waking up at the same times every day and night. This can help you fall asleep faster. Consider making a quiet activity, such as reading, part of your nighttime routine. Try to avoid taking naps during the day so that you sleep better at night. Get out of bed if you are still awake after 15 minutes of trying to sleep. Keep the lights down, but try reading or doing a quiet activity. When you feel sleepy, go back to bed. General instructions Take over-the-counter and prescription medicines only as told by your health care provider. Exercise regularly as told by your health care provider. However, avoid exercising in the hours right before bedtime. Use relaxation techniques to manage stress. Ask your health care provider to suggest some techniques that may work well for you. These may include: Breathing exercises. Routines to release muscle tension. Visualizing peaceful scenes. Make sure that you drive carefully. Do not drive if you feel very sleepy. Keep all follow-up visits. This is important. Contact a health care provider if: You are tired throughout the day. You have trouble in your daily routine due to sleepiness. You continue to have sleep problems, or your sleep problems get worse. Get help right away if: You have thoughts about hurting yourself or someone else. Get help right away if you feel like you may hurt yourself or others, or have thoughts about taking your own life. Go to your nearest emergency room or: Call 911. Call the National Suicide Prevention Lifeline at (423) 614-0342 or 988. This is open 24 hours a day. Text the Crisis Text Line at (240)196-6811. Summary Insomnia is a sleep disorder that makes it difficult to fall asleep or stay asleep. Insomnia can be long-term (chronic) or short-term (acute). Treatment for insomnia depends on the cause. Treatment  may focus on treating an underlying condition that is causing the insomnia. Keep a sleep diary to help you and your health care provider figure out what could be causing your insomnia. This information is not intended to replace advice given to you by your health care provider. Make sure you discuss any questions you have with your health care provider. Document Revised: 02/15/2021 Document Reviewed: 02/15/2021 Elsevier Patient Education  2024 [employer].

## 2023-04-25 ENCOUNTER — Ambulatory Visit: Payer: BC Managed Care – PPO | Admitting: Family Medicine

## 2023-04-28 ENCOUNTER — Encounter: Payer: Self-pay | Admitting: *Deleted

## 2023-05-10 DIAGNOSIS — G4733 Obstructive sleep apnea (adult) (pediatric): Secondary | ICD-10-CM | POA: Diagnosis not present

## 2023-06-19 DIAGNOSIS — R0981 Nasal congestion: Secondary | ICD-10-CM | POA: Diagnosis not present

## 2023-06-19 DIAGNOSIS — R070 Pain in throat: Secondary | ICD-10-CM | POA: Diagnosis not present

## 2023-06-19 DIAGNOSIS — R519 Headache, unspecified: Secondary | ICD-10-CM | POA: Diagnosis not present

## 2023-06-21 DIAGNOSIS — Z96652 Presence of left artificial knee joint: Secondary | ICD-10-CM | POA: Diagnosis not present

## 2023-07-02 DIAGNOSIS — R0981 Nasal congestion: Secondary | ICD-10-CM | POA: Diagnosis not present

## 2023-07-03 ENCOUNTER — Telehealth: Payer: Self-pay

## 2023-07-03 ENCOUNTER — Other Ambulatory Visit: Payer: Self-pay | Admitting: Family Medicine

## 2023-07-03 NOTE — Telephone Encounter (Signed)
 Pharmacy Patient Advocate Encounter   Received notification from CoverMyMeds that prior authorization for Mounjaro 7.5MG /0.5ML auto-injectors  is required/requested.   Insurance verification completed.   The patient is insured through [employer] .   Per test claim: PA required; PA submitted to above mentioned insurance via CoverMyMeds Key/confirmation #/EOC [id number] Status is pending

## 2023-07-07 ENCOUNTER — Other Ambulatory Visit: Payer: Self-pay | Admitting: Family Medicine

## 2023-08-07 NOTE — Telephone Encounter (Signed)
 Pharmacy Patient Advocate Encounter  Received notification from [facility] that Prior Authorization for Mounjaro  7.5MG /0.5ML auto-injectors has been APPROVED from 06/03/2023 to 07/02/2024   PA #/Case ID/Reference #: [id number]

## 2023-09-06 ENCOUNTER — Other Ambulatory Visit: Payer: Self-pay | Admitting: Family Medicine

## 2023-10-24 DIAGNOSIS — Z0000 Encounter for general adult medical examination without abnormal findings: Secondary | ICD-10-CM | POA: Insufficient documentation

## 2023-10-24 NOTE — Assessment & Plan Note (Signed)
 Well controlled, no changes to meds. Encouraged heart healthy diet such as the DASH diet and exercise as tolerated.

## 2023-10-24 NOTE — Progress Notes (Unsigned)
 Subjective:     Patient ID: Jeffrey Molina, male    DOB: [birth year], 63 y.o.   MRN: [id number]  No chief complaint on file.   HPI  Jeffrey Molina a 63 y.o.  male/male***presents today for a complete physical exam and FU chronic conditions. Pt reports consuming a {diet types:17450} diet. {types:19826} Pt generally feels {DESC; WELL/FAIRLY WELL/POORLY:18703}. Reports sleeping {DESC; WELL/FAIRLY WELL/POORLY:18703}.  {does/does not:200015} have additional problems to discuss today.   New Surgeries or Family Hx: ***Yes/ Denies Habits: ETOH, illicit drugs, smoking, secondhand exposure, caffeine     HCM: - Colonoscopy: Last 2024, Next Due 2034 - Immunizations: PNA, Tdap due    Patient Care Team: Domenica Harlene LABOR, MD as PCP - General (Family Medicine) [facility] [place], [place]    Follows with: Sports medicine, Hematology/oncology, pulmonology  HTN-metoprolol  25 mg daily  HLD-atorvastatin  10 mg daily  Hx Pulmonary embolism and LLE DVT dx Dec 2023  Eliquis  5 mg daily  DM type II- metformin  500 mg daily? - Tirzepatide  (Mounjaro ) 7.5 mg injection weekly Lab Results  Component Value Date   HGBA1C 5.4 04/24/2023    ED Sildenafil  (Viagra ) 100 mg-0.5 to 1 tablet by mouth as needed for ED  OSA on CPAP  Vitamin D  deficiency  Lab Results  Component Value Date   VD25OH 32.23 04/24/2023        Patient denies fever, chills, SOB, CP, palpitations, dyspnea, edema, HA, vision changes, N/V/D, abdominal pain, urinary symptoms, rash, weight changes, and recent illness or hospitalizations.   History of Present Illness              Health Maintenance Due  Topic Date Due   FOOT EXAM  Never done   Diabetic kidney evaluation - Urine ACR  Never done   Hepatitis C Screening  Never done   DTaP/Tdap/Td (1 - Tdap) Never done   Pneumococcal Vaccine: 50+ Years (1 of 2 - PCV) Never done   COVID-19 Vaccine (7 - Pfizer risk 2024-25 season) 10/24/2023   INFLUENZA VACCINE  10/20/2023    HEMOGLOBIN A1C  10/22/2023    Past Medical History:  Diagnosis Date   Anemia 10/28/2013   pt. stated no at preop appt. 08-29-14   Arthritis    both knees   Chicken pox as a child   Cutaneous skin tags 10/28/2013   Diabetes mellitus type 2 in obese 01/27/2014   GERD (gastroesophageal reflux disease)    History of chicken pox    Hypertension    Low testosterone  10/28/2013   Peripheral artery disease (HCC) 11/03/2013   Preventative health care 10/26/2016   Sleep apnea    has c-pap machine   Tachycardia 05/15/2014    Past Surgical History:  Procedure Laterality Date   colonscopy     x 2   GASTRIC ROUX-EN-Y N/A 09/02/2014   Procedure: LAPAROSCOPIC ROUX-EN-Y GASTRIC BYPASS WITH UPPER ENDOSCOPY;  Surgeon: Camellia Blush, MD;  Location: [facility];  Service: General;  Laterality: N/A;    Family History  Problem Relation Age of Onset   Fibromyalgia Mother    Cancer Father        skin cancer   Cataracts Father    Arthritis Father    Diabetes Maternal Grandfather     Social History   Socioeconomic History   Marital status: Married    Spouse name: Not on file   Number of children: Not on file   Years of education: Not on file   Highest education level: Bachelor's degree (  e.g., BA, AB, BS)  Occupational History   Occupation: Event organiser: [employer]  Tobacco Use   Smoking status: Never   Smokeless tobacco: Never  Vaping Use   Vaping status: Never Used  Substance and Sexual Activity   Alcohol use: No   Drug use: No   Sexual activity: Yes    Comment: lives with wife and children, works for [employer]  Other Topics Concern   Not on file  Social History Narrative   Not on file   Social Drivers of Health   Financial Resource Strain: Low Risk  (04/21/2023)   Overall Financial Resource Strain (CARDIA)    Difficulty of Paying Living Expenses: Not hard at all  Food Insecurity: No Food Insecurity (04/21/2023)   Hunger Vital Sign    Worried About  Running Out of Food in the Last Year: Never true    Ran Out of Food in the Last Year: Never true  Transportation Needs: No Transportation Needs (04/21/2023)   PRAPARE - Administrator, Civil Service (Medical): No    Lack of Transportation (Non-Medical): No  Physical Activity: Unknown (04/21/2023)   Exercise Vital Sign    Days of Exercise per Week: 0 days    Minutes of Exercise per Session: Not on file  Stress: Stress Concern Present (04/21/2023)   [employer] of Occupational Health - Occupational Stress Questionnaire    Feeling of Stress : To some extent  Social Connections: Socially Integrated (04/21/2023)   Social Connection and Isolation Panel    Frequency of Communication with Friends and Family: More than three times a week    Frequency of Social Gatherings with Friends and Family: Once a week    Attends Religious Services: More than 4 times per year    Active Member of [employer] or Organizations: Yes    Attends Engineer, structural: More than 4 times per year    Marital Status: Married  Catering manager Violence: Not At Risk (08/05/2022)   Humiliation, Afraid, Rape, and Kick questionnaire    Fear of Current or Ex-Partner: No    Emotionally Abused: No    Physically Abused: No    Sexually Abused: No    Outpatient Medications Prior to Visit  Medication Sig Dispense Refill   atorvastatin  (LIPITOR) 10 MG tablet TAKE 1 TABLET BY MOUTH EVERY DAY 90 tablet 1   Calcium  Carb-Cholecalciferol (CALCIUM  600 + D PO) Take 1 tablet by mouth daily.     diphenhydrAMINE HCl, Sleep, (ZZZQUIL) 50 MG/30ML LIQD Take by mouth at bedtime as needed.     ELIQUIS  5 MG TABS tablet TAKE 1 TABLET TWICE A DAY 180 tablet 3   hydrOXYzine  (VISTARIL ) 25 MG capsule TAKE 1 CAPSULE (25 MG TOTAL) BY MOUTH TWICE A DAY AS NEEDED 40 capsule 2   metFORMIN  (GLUCOPHAGE ) 500 MG tablet Take 1 tablet (500 mg total) by mouth daily with breakfast. 30 tablet 3   Methylcobalamin (B-12) 5000 MCG TBDP Take 1  tablet by mouth daily.     metoprolol  succinate (TOPROL -XL) 25 MG 24 hr tablet TAKE 1 TABLET DAILY WITH OR IMMEDIATELY FOLLOWING A MEAL 90 tablet 3   Multiple Vitamin (MULTIVITAMIN WITH MINERALS) TABS tablet Take 1 tablet by mouth 2 (two) times daily.     Probiotic Product (PROBIOTIC & ACIDOPHILUS EX ST PO) Take 1 tablet by mouth daily.     sildenafil  (VIAGRA ) 100 MG tablet Take 0.5-1 tablets (50-100 mg total) by mouth daily  as needed for erectile dysfunction. 20 tablet 5   tirzepatide  (MOUNJARO ) 7.5 MG/0.5ML Pen Inject 7.5 mg into the skin once a week. 2 mL 1   Vitamin D , Ergocalciferol , (DRISDOL ) 1.25 MG (50000 UNIT) CAPS capsule TAKE 1 CAPSULE (50,000 UNITS TOTAL) BY MOUTH EVERY 7 (SEVEN) DAYS 5 capsule 3   No facility-administered medications prior to visit.    Allergies  Allergen Reactions   Lisinopril -Hydrochlorothiazide  Swelling    Lip swollen    ROS See HPI    Objective:    Physical Exam Constitutional:      General: He is not in acute distress.    Appearance: He is obese. He is not ill-appearing, toxic-appearing or diaphoretic.  HENT:     Head: Normocephalic and atraumatic.     Right Ear: Tympanic membrane, ear canal and external ear normal.     Left Ear: Tympanic membrane, ear canal and external ear normal.     Nose: Nose normal. No congestion.     Mouth/Throat:     Mouth: Mucous membranes are moist.     Pharynx: Oropharynx is clear.  Eyes:     Extraocular Movements: Extraocular movements intact.     Right eye: Normal extraocular motion.     Left eye: Normal extraocular motion.     Conjunctiva/sclera: Conjunctivae normal.     Pupils: Pupils are equal, round, and reactive to light.  Neck:     Thyroid : No thyroid  mass or thyromegaly.     Vascular: No carotid bruit or JVD.  Cardiovascular:     Rate and Rhythm: Normal rate and regular rhythm.     Pulses: Normal pulses.          Radial pulses are 2+ on the right side and 2+ on the left side.       Dorsalis pedis  pulses are 2+ on the right side and 2+ on the left side.     Heart sounds: Normal heart sounds, S1 normal and S2 normal. No murmur heard.    No friction rub. No gallop.  Pulmonary:     Effort: Pulmonary effort is normal. No respiratory distress.     Breath sounds: Normal breath sounds.  Abdominal:     General: Bowel sounds are normal. There is no distension.     Palpations: Abdomen is soft.     Tenderness: There is no abdominal tenderness. There is no guarding.  Musculoskeletal:        General: Normal range of motion.     Cervical back: Full passive range of motion without pain and normal range of motion. No edema or erythema.     Right lower leg: No edema.     Left lower leg: No edema.  Lymphadenopathy:     Cervical: No cervical adenopathy.  Skin:    General: Skin is warm and dry.     Capillary Refill: Capillary refill takes less than 2 seconds.  Neurological:     General: No focal deficit present.     Mental Status: He is alert and oriented to person, place, and time.     Cranial Nerves: No cranial nerve deficit.     Motor: No weakness.     Coordination: Coordination normal.     Gait: Gait normal.     Deep Tendon Reflexes: Reflexes normal.  Psychiatric:        Mood and Affect: Mood normal.        Behavior: Behavior normal.        Thought Content: Thought  content normal.        There were no vitals taken for this visit. Wt Readings from Last 3 Encounters:  04/24/23 287 lb 12.8 oz (130.5 kg)  01/02/23 297 lb 0.6 oz (134.7 kg)  11/04/22 (!) 308 lb 1.3 oz (139.7 kg)       Assessment & Plan:   Problem List Items Addressed This Visit     Anemia - Primary   Update CBC.  Mild, Increase leafy greens, consider increased lean red meat and using cast iron cookware. Continue to monitor, report any concerns        HTN (hypertension) (Chronic)   Well controlled, no changes to meds. Encouraged heart healthy diet such as the DASH diet and exercise as tolerated.         Hyperglycemia   hgba1c acceptable, minimize simple carbs. Increase exercise as tolerated. Continue current meds       Hyperlipidemia   Encourage heart healthy diet such as MIND or DASH diet, increase exercise, avoid trans fats, simple carbohydrates and processed foods, consider a krill or fish or flaxseed oil cap daily.        Morbid obesity (HCC) (Chronic)   Encouraged DASH or MIND diet, decrease po intake and increase exercise as tolerated. Needs 7-8 hours of sleep nightly. Avoid trans fats, eat small, frequent meals every 4-5 hours with lean proteins, complex carbs and healthy fats. Minimize simple carbs, high fat foods and processed foods        OSA on CPAP (Chronic)   Follows with pulmonology.      Type 2 diabetes mellitus with obesity (HCC)   Other Visit Diagnoses       Vitamin D  deficiency          Hx Pulmonary embolism and LLE DVT dx Dec 2023  Eliquis  5 mg daily.  Last seen by hematology/oncology 12/2022 for to determine length of time for anticoagulation.  Encouraged patient to follow-up.    HCM: - Colonoscopy: Last 2024, Next Due 2034 - Immunizations: PNA, Tdap due  Portions of this note were dictated using DRAGON voice recognition software. Please disregard any errors in transcription.    I am having Jeffrey Molina maintain his B-12, Calcium  Carb-Cholecalciferol (CALCIUM  600 + D PO), multivitamin with minerals, Probiotic Product (PROBIOTIC & ACIDOPHILUS EX ST PO), metFORMIN , ZzzQuil, hydrOXYzine , sildenafil , metoprolol  succinate, atorvastatin , Eliquis , Vitamin D  (Ergocalciferol ), and Mounjaro .  No orders of the defined types were placed in this encounter.

## 2023-10-24 NOTE — Assessment & Plan Note (Signed)
 Encourage heart healthy diet such as MIND or DASH diet, increase exercise, avoid trans fats, simple carbohydrates and processed foods, consider a krill or fish or flaxseed oil cap daily.

## 2023-10-24 NOTE — Assessment & Plan Note (Signed)
 Update CBC.  Mild, Increase leafy greens, consider increased lean red meat and using cast iron cookware. Continue to monitor, report any concerns

## 2023-10-24 NOTE — Assessment & Plan Note (Signed)
 hgba1c acceptable, minimize simple carbs. Increase exercise as tolerated. Continue current meds

## 2023-10-24 NOTE — Assessment & Plan Note (Signed)
 Encouraged patient to maintain a healthy lifestyle, including regular physical activity, adequate hydration, and a well-balanced diet. Advised limiting processed foods, added sugars, and sodium intake. Reinforced the importance of sleep, stress management, and routine preventive care.  Take medication as prescribed.

## 2023-10-24 NOTE — Assessment & Plan Note (Signed)
 Follows with pulmonology

## 2023-10-24 NOTE — Assessment & Plan Note (Signed)
 Encouraged DASH or MIND diet, decrease po intake and increase exercise as tolerated. Needs 7-8 hours of sleep nightly. Avoid trans fats, eat small, frequent meals every 4-5 hours with lean proteins, complex carbs and healthy fats. Minimize simple carbs, high fat foods and processed foods

## 2023-10-26 ENCOUNTER — Ambulatory Visit: Payer: BC Managed Care – PPO | Admitting: Family Medicine

## 2023-10-26 ENCOUNTER — Encounter: Payer: Self-pay | Admitting: Student

## 2023-10-26 ENCOUNTER — Ambulatory Visit: Admitting: Student

## 2023-10-26 VITALS — BP 126/82 | HR 79 | Temp 98.2°F | Resp 12 | Ht 72.0 in | Wt 286.6 lb

## 2023-10-26 DIAGNOSIS — I10 Essential (primary) hypertension: Secondary | ICD-10-CM

## 2023-10-26 DIAGNOSIS — E559 Vitamin D deficiency, unspecified: Secondary | ICD-10-CM | POA: Diagnosis not present

## 2023-10-26 DIAGNOSIS — Z0000 Encounter for general adult medical examination without abnormal findings: Secondary | ICD-10-CM | POA: Diagnosis not present

## 2023-10-26 DIAGNOSIS — Z7985 Long-term (current) use of injectable non-insulin antidiabetic drugs: Secondary | ICD-10-CM | POA: Diagnosis not present

## 2023-10-26 DIAGNOSIS — E785 Hyperlipidemia, unspecified: Secondary | ICD-10-CM

## 2023-10-26 DIAGNOSIS — E1169 Type 2 diabetes mellitus with other specified complication: Secondary | ICD-10-CM | POA: Diagnosis not present

## 2023-10-26 DIAGNOSIS — R739 Hyperglycemia, unspecified: Secondary | ICD-10-CM

## 2023-10-26 DIAGNOSIS — Z1159 Encounter for screening for other viral diseases: Secondary | ICD-10-CM

## 2023-10-26 DIAGNOSIS — Z125 Encounter for screening for malignant neoplasm of prostate: Secondary | ICD-10-CM | POA: Diagnosis not present

## 2023-10-26 DIAGNOSIS — G4709 Other insomnia: Secondary | ICD-10-CM

## 2023-10-26 DIAGNOSIS — G4733 Obstructive sleep apnea (adult) (pediatric): Secondary | ICD-10-CM

## 2023-10-26 DIAGNOSIS — D649 Anemia, unspecified: Secondary | ICD-10-CM

## 2023-10-26 DIAGNOSIS — E669 Obesity, unspecified: Secondary | ICD-10-CM | POA: Diagnosis not present

## 2023-10-26 DIAGNOSIS — Z6838 Body mass index (BMI) 38.0-38.9, adult: Secondary | ICD-10-CM

## 2023-10-26 LAB — COMPREHENSIVE METABOLIC PANEL WITH GFR
ALT: 18 U/L (ref 0–53)
AST: 14 U/L (ref 0–37)
Albumin: 3.7 g/dL (ref 3.5–5.2)
Alkaline Phosphatase: 64 U/L (ref 39–117)
BUN: 13 mg/dL (ref 6–23)
CO2: 25 meq/L (ref 19–32)
Calcium: 9 mg/dL (ref 8.4–10.5)
Chloride: 102 meq/L (ref 96–112)
Creatinine, Ser: 0.99 mg/dL (ref 0.40–1.50)
GFR: 82.25 mL/min (ref >60.00)
Glucose, Bld: 90 mg/dL (ref 70–99)
Potassium: 4.2 meq/L (ref 3.5–5.1)
Sodium: 139 meq/L (ref 135–145)
Total Bilirubin: 0.7 mg/dL (ref 0.2–1.2)
Total Protein: 6.2 g/dL (ref 6.0–8.3)

## 2023-10-26 LAB — CBC WITH DIFFERENTIAL/PLATELET
Basophils Absolute: 0 K/uL (ref 0.0–0.1)
Basophils Relative: 0.1 % (ref 0.0–3.0)
Eosinophils Absolute: 0.3 K/uL (ref 0.0–0.7)
Eosinophils Relative: 5.1 % — ABNORMAL HIGH (ref 0.0–5.0)
HCT: 42.3 % (ref 39.0–52.0)
Hemoglobin: 13.9 g/dL (ref 13.0–17.0)
Lymphocytes Relative: 21.4 % (ref 12.0–46.0)
Lymphs Abs: 1.2 K/uL (ref 0.7–4.0)
MCHC: 32.8 g/dL (ref 30.0–36.0)
MCV: 88.3 fl (ref 78.0–100.0)
Monocytes Absolute: 0.7 K/uL (ref 0.1–1.0)
Monocytes Relative: 13.2 % — ABNORMAL HIGH (ref 3.0–12.0)
Neutro Abs: 3.4 K/uL (ref 1.4–7.7)
Neutrophils Relative %: 60.2 % (ref 43.0–77.0)
Platelets: 293 K/uL (ref 150.0–400.0)
RBC: 4.79 Mil/uL (ref 4.22–5.81)
RDW: 14 % (ref 11.5–15.5)
WBC: 5.6 K/uL (ref 4.0–10.5)

## 2023-10-26 LAB — LIPID PANEL
Cholesterol: 121 mg/dL (ref 0–200)
HDL: 38.8 mg/dL — ABNORMAL LOW (ref >39.00)
LDL Cholesterol: 64 mg/dL (ref 0–99)
NonHDL: 82.06
Total CHOL/HDL Ratio: 3
Triglycerides: 89 mg/dL (ref 0.0–149.0)
VLDL: 17.8 mg/dL (ref 0.0–40.0)

## 2023-10-26 LAB — VITAMIN D 25 HYDROXY (VIT D DEFICIENCY, FRACTURES): VITD: 48.73 ng/mL (ref 30.00–100.00)

## 2023-10-26 LAB — HEMOGLOBIN A1C: Hgb A1c MFr Bld: 5.9 % (ref 4.6–6.5)

## 2023-10-26 LAB — PSA: PSA: 0.75 ng/mL (ref 0.10–4.00)

## 2023-10-26 MED ORDER — HYDROXYZINE PAMOATE 25 MG PO CAPS: 25.0000 mg | ORAL_CAPSULE | Freq: Every evening | ORAL | 0 refills | Status: DC

## 2023-10-26 MED ORDER — TIRZEPATIDE 10 MG/0.5ML ~~LOC~~ SOAJ: 10.0000 mg | SUBCUTANEOUS | 1 refills | Status: AC

## 2023-10-26 NOTE — Assessment & Plan Note (Signed)
 hgba1c acceptable, minimize simple carbs. Increase exercise as tolerated.  Patient reports plateau in weight loss with current dose Mounjaro .  Rx-increase Mounjaro  10 mg injection weekly.

## 2023-10-27 LAB — MICROALBUMIN / CREATININE URINE RATIO
Creatinine,U: 128 mg/dL
Microalb Creat Ratio: UNDETERMINED mg/g (ref 0.0–30.0)
Microalb, Ur: <0.7 mg/dL

## 2023-10-27 LAB — HEPATITIS C ANTIBODY: Hepatitis C Ab: NONREACTIVE

## 2023-10-29 ENCOUNTER — Ambulatory Visit: Payer: Self-pay | Admitting: Student

## 2023-11-29 ENCOUNTER — Encounter: Payer: Self-pay | Admitting: Student

## 2023-12-06 ENCOUNTER — Other Ambulatory Visit: Payer: Self-pay | Admitting: Family Medicine

## 2023-12-31 DIAGNOSIS — R059 Cough, unspecified: Secondary | ICD-10-CM | POA: Diagnosis not present

## 2023-12-31 DIAGNOSIS — R0981 Nasal congestion: Secondary | ICD-10-CM | POA: Diagnosis not present

## 2024-01-10 ENCOUNTER — Other Ambulatory Visit: Payer: Self-pay | Admitting: Family Medicine

## 2024-02-07 ENCOUNTER — Other Ambulatory Visit: Payer: Self-pay | Admitting: Student

## 2024-02-07 DIAGNOSIS — G4709 Other insomnia: Secondary | ICD-10-CM

## 2024-04-04 ENCOUNTER — Ambulatory Visit: Admitting: Primary Care

## 2024-04-04 ENCOUNTER — Encounter: Payer: Self-pay | Admitting: Primary Care

## 2024-04-04 ENCOUNTER — Telehealth: Payer: Self-pay | Admitting: Primary Care

## 2024-04-04 VITALS — BP 116/72 | HR 71 | Temp 97.5°F | Ht 72.0 in | Wt 285.2 lb

## 2024-04-04 DIAGNOSIS — G4733 Obstructive sleep apnea (adult) (pediatric): Secondary | ICD-10-CM | POA: Diagnosis not present

## 2024-04-04 NOTE — Patient Instructions (Addendum)
" °  VISIT SUMMARY: Jeffrey Molina, a 64 year old male with severe obstructive sleep apnea, came in for a follow-up on his CPAP therapy. He has been using a CPAP machine since 2006 and has experienced significant weight loss over the past 20 years. He continues to use the CPAP machine consistently and reports improved sleep quality.  YOUR PLAN: -OBSTRUCTIVE SLEEP APNEA: Obstructive sleep apnea is a condition where the airway becomes blocked during sleep, causing breathing to stop and start repeatedly. Your sleep apnea is well-controlled with your current CPAP therapy, and you are using the machine consistently. We will continue with your current CPAP settings as they are effective. I will provide a prescription for CPAP supplies if needed for online purchases. Additionally, further weight loss may help improve your sleep apnea. We also discussed that your well-controlled sleep apnea and compliance with CPAP therapy reduce the risk of complications with anesthesia.  INSTRUCTIONS: Continue using your CPAP machine with the current settings. If you need a prescription for CPAP supplies for online purchases, please let us  know. Consider further weight loss to potentially improve your sleep apnea. Follow up as needed.   "

## 2024-04-04 NOTE — Telephone Encounter (Signed)
 Can we print prescription for CPAP supplies for patient to upload to [employer], he is going to send us  a message with the exact mask and size

## 2024-04-04 NOTE — Telephone Encounter (Signed)
 Disregard, printed

## 2024-04-09 ENCOUNTER — Telehealth: Payer: Self-pay | Admitting: Family Medicine

## 2024-04-09 NOTE — Telephone Encounter (Signed)
 Pt dropped of pre-op form for pcp to complete. Pt asks that form be faxed to office and to be notified when faxed. Placed in pcp's tray in fo.

## 2024-04-09 NOTE — Telephone Encounter (Signed)
 Called pt and left a message to call back to schedule an pre-op appointment with Harlene Wheeler CHOL.

## 2024-04-10 ENCOUNTER — Ambulatory Visit: Admitting: Student

## 2024-04-10 ENCOUNTER — Encounter: Payer: Self-pay | Admitting: Student

## 2024-04-10 ENCOUNTER — Ambulatory Visit (HOSPITAL_BASED_OUTPATIENT_CLINIC_OR_DEPARTMENT_OTHER)
Admission: RE | Admit: 2024-04-10 | Discharge: 2024-04-10 | Disposition: A | Discharge status: Home / Self Care | Source: Ambulatory Visit | Attending: Student | Admitting: Student

## 2024-04-10 VITALS — BP 128/80 | HR 69 | Resp 16 | Ht 72.0 in | Wt 288.4 lb

## 2024-04-10 DIAGNOSIS — Z01818 Encounter for other preprocedural examination: Secondary | ICD-10-CM | POA: Insufficient documentation

## 2024-04-10 NOTE — Progress Notes (Deleted)
 No chief complaint on file.   Jeffrey Molina is here for abdominal pain.  Duration: {Numbers; 1-10:13787} {days/wks/mos/yrs:310907} Nighttime awakenings? {yes/no:20286} Bleeding? {yes/no:20286} Weight loss? {yes/no:20286} Palliation:  Provocation: Associated symptoms: {symptoms:20414} Denies: {symptoms:20414} Treatment to date: ***  Past Medical History:  Diagnosis Date   Anemia 10/28/2013   pt. stated no at preop appt. 08-29-14   Arthritis    both knees   Chicken pox as a child   Cutaneous skin tags 10/28/2013   Diabetes mellitus type 2 in obese 01/27/2014   GERD (gastroesophageal reflux disease)    History of chicken pox    Hypertension    Low testosterone  10/28/2013   Peripheral artery disease 11/03/2013   Preventative health care 10/26/2016   Sleep apnea    has c-pap machine   Tachycardia 05/15/2014    There were no vitals taken for this visit. Gen.: Awake, alert, appears stated age HEENT: Mucous membranes moist without mucosal lesions Heart: Regular rate and rhythm without murmurs Lungs: Clear auscultation bilaterally, no rales or wheezing, normal effort without accessory muscle use. Abdomen: Bowel sounds are present. Abdomen is soft, nontender, nondistended, no masses or organomegaly. Negative Murphy's, Rovsing's, McBurney's, and Carnett's sign. Psych: Age appropriate judgment and insight. Normal mood and affect.  No diagnosis found.  Orders as above. F/u *** Pt voiced understanding and agreement to the plan.  Harlene LITTIE Jolly, DNP, AGNP-C 04/10/24 11:29 AM

## 2024-04-10 NOTE — Progress Notes (Signed)
 "    Subjective:      HPI: Pt is a 64 y.o. male who is here for preoperative clearance for right knee replacement preop visit  1) High Risk Cardiac Conditions:  1) Recent MI - No.  2) Decompensated Heart Failure - No.  3) Unstable angina - No.  4) Symptomatic arrythmia - No.  5) Sx Valvular Disease - No.  2) Intermediate Risk Factors: DM, CKD, CVA, CHF, CAD - Yes.    2) Functional Status: > 4 mets (Walk, run, climb stairs) Yes.  SABRA   3) Surgery Specific Risk: Intermediate  4) Further Noninvasive evaluation:   1) EKG - Yes.     Hx of MI, CVA, CAD, DM, CKD  2) Echo - No.   Worsening dyspnea or CHF without an echo in the past year  3) Stress Testing - Active Cardiac Disease - No.  4) CXR Yes.     If asymptomatic, healthy, no respiratory symptoms it is not needed  5)PFTs No.   OSA, OHS, or significant cardiopulmonary history  5) Need for medical therapy - Beta Blocker, Statins indicated ? Yes.      Social history:  Relevant past medical, surgical, family and social history reviewed and updated as indicated. Interim medical history since our last visit reviewed.  Allergies and medications reviewed and updated.  DATA REVIEWED: CHART IN EPIC  ROS: Negative unless specifically indicated above in HPI.   Current Medications[1]      Objective:    BP 128/80 (BP Location: Left Arm, Patient Position: Sitting, Cuff Size: Large)   Pulse 69   Resp 16   Ht 6' (1.829 m)   Wt 288 lb 6.4 oz (130.8 kg)   SpO2 98%   BMI 39.11 kg/m   Wt Readings from Last 3 Encounters:  04/10/24 288 lb 6.4 oz (130.8 kg)  04/04/24 285 lb 3.2 oz (129.4 kg)  10/26/23 286 lb 9.6 oz (130 kg)    Physical Exam Vitals reviewed.  Constitutional:      General: He is not in acute distress.    Appearance: He is not toxic-appearing.  HENT:     Head: Normocephalic and atraumatic.     Mouth/Throat:     Mouth: Mucous membranes are moist.     Pharynx: Oropharynx is clear.  Eyes:     Pupils:  Pupils are equal, round, and reactive to light.  Cardiovascular:     Rate and Rhythm: Normal rate and regular rhythm.     Pulses: Normal pulses.     Heart sounds: Normal heart sounds. No murmur heard. Pulmonary:     Effort: Pulmonary effort is normal. No respiratory distress.     Breath sounds: Normal breath sounds. No wheezing.  Musculoskeletal:        General: No swelling.     Cervical back: Neck supple.  Skin:    General: Skin is warm and dry.  Neurological:     General: No focal deficit present.     Mental Status: He is alert and oriented to person, place, and time.  Psychiatric:        Mood and Affect: Mood normal.        Behavior: Behavior normal.        Thought Content: Thought content normal.        Judgment: Judgment normal.     EKG interpretation: Rate: 62 Rhythm: NSR No ST/T changes concerning for acute ischemia/infarct  EKG performed for pre operative clearance.  I have personally  seen and reviewed the EKG, which is consistent with prior tracings. No acute concerns. Harlene LITTIE Jolly, NP  04/10/24        Assessment & Plan:  Pre-operative clearance -     EKG 12-Lead -     DG Chest 2 View; Future -     CBC -     Comprehensive metabolic panel with GFR -     Protime-INR; Future -     Urinalysis -     Urine Culture   I have independently evaluated patient.  Jaymien Landin is a 64 y.o. male who is average risk for intermediate risk surgery.  There are not modifiable risk factors (smoking, etc). Russell Lobos's RCRI/NSQIP calculation for MACE is: 0.5%, RCRI- 0  Review meds:.OK to do BB or statin day of surgery Discontinue Eliquis  5 days prior to surgery; resume postoperatively per surgical guidance.  Discontinue GLP-1 (Mounjaro ) 7-10 days prior to surgery; resume postoperatively.   No follow-ups on file.  Bellatrix Devonshire L. Katerin Negrete, DNP, AGNP-C           [1]  Current Outpatient Medications:    atorvastatin  (LIPITOR) 10 MG tablet, TAKE 1 TABLET BY  MOUTH EVERY DAY, Disp: 90 tablet, Rfl: 1   Calcium  Carb-Cholecalciferol (CALCIUM  600 + D PO), Take 1 tablet by mouth daily., Disp: , Rfl:    diphenhydrAMINE HCl, Sleep, (ZZZQUIL) 50 MG/30ML LIQD, Take by mouth at bedtime as needed., Disp: , Rfl:    ELIQUIS  5 MG TABS tablet, TAKE 1 TABLET TWICE A DAY, Disp: 180 tablet, Rfl: 3   hydrOXYzine  (VISTARIL ) 25 MG capsule, Take 1 capsule (25 mg total) by mouth at bedtime., Disp: 90 capsule, Rfl: 1   metFORMIN  (GLUCOPHAGE ) 500 MG tablet, Take 1 tablet (500 mg total) by mouth daily with breakfast., Disp: 30 tablet, Rfl: 3   Methylcobalamin (B-12) 5000 MCG TBDP, Take 1 tablet by mouth daily., Disp: , Rfl:    metoprolol  succinate (TOPROL -XL) 25 MG 24 hr tablet, TAKE 1 TABLET DAILY WITH OR IMMEDIATELY FOLLOWING A MEAL, Disp: 90 tablet, Rfl: 3   Multiple Vitamin (MULTIVITAMIN WITH MINERALS) TABS tablet, Take 1 tablet by mouth 2 (two) times daily., Disp: , Rfl:    Probiotic Product (PROBIOTIC & ACIDOPHILUS EX ST PO), Take 1 tablet by mouth daily., Disp: , Rfl:    sildenafil  (VIAGRA ) 100 MG tablet, Take 0.5-1 tablets (50-100 mg total) by mouth daily as needed for erectile dysfunction., Disp: 20 tablet, Rfl: 5   tirzepatide  (MOUNJARO ) 10 MG/0.5ML Pen, Inject 10 mg into the skin once a week., Disp: 6 mL, Rfl: 1   Vitamin D , Ergocalciferol , (DRISDOL ) 1.25 MG (50000 UNIT) CAPS capsule, TAKE 1 CAPSULE (50,000 UNITS TOTAL) BY MOUTH EVERY 7 (SEVEN) DAYS, Disp: 5 capsule, Rfl: 3  "

## 2024-04-11 ENCOUNTER — Ambulatory Visit: Payer: Self-pay | Admitting: Student

## 2024-04-11 LAB — COMPREHENSIVE METABOLIC PANEL WITH GFR
ALT: 23 U/L (ref 3–53)
AST: 21 U/L (ref 5–37)
Albumin: 3.7 g/dL (ref 3.5–5.2)
Alkaline Phosphatase: 55 U/L (ref 39–117)
BUN: 10 mg/dL (ref 6–23)
CO2: 29 meq/L (ref 19–32)
Calcium: 8.7 mg/dL (ref 8.4–10.5)
Chloride: 103 meq/L (ref 96–112)
Creatinine, Ser: 1.01 mg/dL (ref 0.40–1.50)
GFR: 80.04 mL/min
Glucose, Bld: 86 mg/dL (ref 70–99)
Potassium: 4.1 meq/L (ref 3.5–5.1)
Sodium: 138 meq/L (ref 135–145)
Total Bilirubin: 0.5 mg/dL (ref 0.2–1.2)
Total Protein: 6.3 g/dL (ref 6.0–8.3)

## 2024-04-11 LAB — URINALYSIS
Bilirubin Urine: NEGATIVE
Hgb urine dipstick: NEGATIVE
Ketones, ur: NEGATIVE
Leukocytes,Ua: NEGATIVE
Nitrite: NEGATIVE
Specific Gravity, Urine: 1.02 (ref 1.000–1.030)
Total Protein, Urine: NEGATIVE
Urine Glucose: NEGATIVE
Urobilinogen, UA: 0.2 (ref 0.0–1.0)
pH: 6 (ref 5.0–8.0)

## 2024-04-11 LAB — CBC
HCT: 41.3 % (ref 39.0–52.0)
Hemoglobin: 13.7 g/dL (ref 13.0–17.0)
MCHC: 33.1 g/dL (ref 30.0–36.0)
MCV: 89.1 fl (ref 78.0–100.0)
Platelets: 273 K/uL (ref 150.0–400.0)
RBC: 4.63 Mil/uL (ref 4.22–5.81)
RDW: 15.1 % (ref 11.5–15.5)
WBC: 5.9 K/uL (ref 4.0–10.5)

## 2024-04-11 LAB — PROTIME-INR
INR: 1.3 ratio — ABNORMAL HIGH (ref 0.8–1.0)
Prothrombin Time: 14 s — ABNORMAL HIGH (ref 9.6–13.1)

## 2024-04-11 LAB — URINE CULTURE
MICRO NUMBER:: 17496900
Result:: NO GROWTH
SPECIMEN QUALITY:: ADEQUATE

## 2024-04-17 ENCOUNTER — Telehealth: Payer: Self-pay | Admitting: Primary Care

## 2024-04-17 NOTE — Telephone Encounter (Signed)
 Fax received from Dr. Rubie with [facility] to perform a right total knee arthroplasty on patient under spinal anesthesia  Patient needs surgery clearance. Surgery is pending. Patient was seen on 04/04/24. Office protocol is a risk assessment can be sent to surgeon if patient has been seen in 60 days or less.   Sending to [facility] for risk assessment or recommendations if patient needs to be seen in office prior to surgical procedure.

## 2024-04-18 ENCOUNTER — Telehealth: Payer: Self-pay | Admitting: Student

## 2024-04-18 NOTE — Telephone Encounter (Signed)
 Pt dropped off forms to be completed, forms given to cma

## 2024-04-19 ENCOUNTER — Other Ambulatory Visit: Payer: Self-pay

## 2024-04-19 DIAGNOSIS — Z01818 Encounter for other preprocedural examination: Secondary | ICD-10-CM

## 2024-04-19 DIAGNOSIS — Z86718 Personal history of other venous thrombosis and embolism: Secondary | ICD-10-CM

## 2024-04-22 NOTE — Telephone Encounter (Signed)
 Can we advise if form has been sent back

## 2024-04-24 ENCOUNTER — Telehealth: Payer: Self-pay | Admitting: Student

## 2024-04-24 NOTE — Telephone Encounter (Signed)
 Addendum was been added to note. Low-intermediate risk for prolonged mechanical ventilation or post op pulmonary complications. Severe OSA well controlled on CPAP. Cleared from a pulmonary standpoint.

## 2024-04-24 NOTE — Telephone Encounter (Signed)
 Please call the patient and inform him that I will fax the preoperative clearance form from his surgeon regarding holding Mounjaro . However, he will need Hematology to complete the presurgical clearance form regarding his Eliquis  regimen. Since his Eliquis  dosing may be adjusted at upcoming appointment, it is most appropriate for Hematology to determine how long to hold and when to restart Eliquis  for his upcoming surgery. Advise bringing the surgeons form to his upcoming Hematology appointment for completion.

## 2024-04-24 NOTE — Telephone Encounter (Signed)
 Patient is calling again regarding his surgery clearance form.  His surgeon is still waiting for the form to be completed and sent back.  He is trying to get this surgery done in April.  Please return his call asap to let him know the status of this form.  He can be reached at [phone].  Thanks.

## 2024-04-25 NOTE — Telephone Encounter (Signed)
 Addended note faxed to [facility].

## 2024-04-25 NOTE — Telephone Encounter (Signed)
 Can we help pt determine if this has been done?

## 2024-04-26 ENCOUNTER — Other Ambulatory Visit

## 2024-04-26 DIAGNOSIS — Z01818 Encounter for other preprocedural examination: Secondary | ICD-10-CM

## 2024-04-26 LAB — HEMOGLOBIN A1C: Hgb A1c MFr Bld: 5.7 % (ref 4.6–6.5)

## 2024-04-29 ENCOUNTER — Ambulatory Visit: Admitting: Family Medicine

## 2024-05-01 ENCOUNTER — Inpatient Hospital Stay: Admitting: Medical Oncology

## 2024-05-01 ENCOUNTER — Inpatient Hospital Stay

## 2024-07-22 ENCOUNTER — Ambulatory Visit: Admitting: Family Medicine
